# Patient Record
Sex: Male | Born: 1955 | Race: White | Hispanic: No | Marital: Single | Smoking: Never smoker
Health system: Southern US, Community
[De-identification: ages and names within clinical notes are randomized; demographics above are authoritative.]

## PROBLEM LIST (undated history)

## (undated) ENCOUNTER — Emergency Department: Discharge status: Absent without leave | Payer: Medicare Other

## (undated) DIAGNOSIS — F419 Anxiety disorder, unspecified: Secondary | ICD-10-CM

## (undated) DIAGNOSIS — E119 Type 2 diabetes mellitus without complications: Secondary | ICD-10-CM

## (undated) DIAGNOSIS — M21612 Bunion of left foot: Secondary | ICD-10-CM

## (undated) DIAGNOSIS — B20 Human immunodeficiency virus [HIV] disease: Secondary | ICD-10-CM

## (undated) DIAGNOSIS — I10 Essential (primary) hypertension: Secondary | ICD-10-CM

## (undated) DIAGNOSIS — N179 Acute kidney failure, unspecified: Secondary | ICD-10-CM

## (undated) DIAGNOSIS — E781 Pure hyperglyceridemia: Secondary | ICD-10-CM

## (undated) DIAGNOSIS — M21611 Bunion of right foot: Secondary | ICD-10-CM

## (undated) DIAGNOSIS — I2510 Atherosclerotic heart disease of native coronary artery without angina pectoris: Secondary | ICD-10-CM

## (undated) DIAGNOSIS — A5271 Late syphilitic oculopathy: Secondary | ICD-10-CM

## (undated) DIAGNOSIS — E785 Hyperlipidemia, unspecified: Secondary | ICD-10-CM

## (undated) DIAGNOSIS — B59 Pneumocystosis: Secondary | ICD-10-CM

## (undated) DIAGNOSIS — E039 Hypothyroidism, unspecified: Secondary | ICD-10-CM

## (undated) DIAGNOSIS — M1990 Unspecified osteoarthritis, unspecified site: Secondary | ICD-10-CM

## (undated) HISTORY — DX: Late syphilitic oculopathy: A52.71

## (undated) HISTORY — DX: Atherosclerotic heart disease of native coronary artery without angina pectoris: I25.10

## (undated) HISTORY — DX: Type 2 diabetes mellitus without complications: E11.9

## (undated) HISTORY — DX: Acute kidney failure, unspecified: N17.9

## (undated) HISTORY — DX: Bunion of right foot: M21.612

## (undated) HISTORY — DX: Human immunodeficiency virus (HIV) disease: B20

## (undated) HISTORY — DX: Anxiety disorder, unspecified: F41.9

## (undated) HISTORY — DX: Bunion of right foot: M21.611

## (undated) HISTORY — DX: Hypothyroidism, unspecified: E03.9

## (undated) HISTORY — DX: Hyperlipidemia, unspecified: E78.5

## (undated) HISTORY — DX: Essential (primary) hypertension: I10

## (undated) HISTORY — PX: CONDYLOMA EXCISION/FULGURATION: SHX1389

---

## 2007-09-03 DIAGNOSIS — B59 Pneumocystosis: Secondary | ICD-10-CM

## 2007-09-03 HISTORY — DX: Pneumocystosis: B59

## 2007-09-29 ENCOUNTER — Inpatient Hospital Stay (HOSPITAL_COMMUNITY): Admission: EM | Admit: 2007-09-29 | Discharge: 2007-10-11 | Payer: Self-pay | Admitting: Emergency Medicine

## 2007-09-29 ENCOUNTER — Ambulatory Visit: Payer: Self-pay | Admitting: Pulmonary Disease

## 2007-09-29 HISTORY — PX: BRONCHOSCOPY: SUR163

## 2007-09-30 ENCOUNTER — Ambulatory Visit: Payer: Self-pay | Admitting: Infectious Disease

## 2007-10-01 ENCOUNTER — Encounter: Payer: Self-pay | Admitting: Pulmonary Disease

## 2007-10-14 ENCOUNTER — Ambulatory Visit: Payer: Self-pay | Admitting: Infectious Disease

## 2007-10-14 DIAGNOSIS — F1420 Cocaine dependence, uncomplicated: Secondary | ICD-10-CM | POA: Insufficient documentation

## 2007-10-14 DIAGNOSIS — A4901 Methicillin susceptible Staphylococcus aureus infection, unspecified site: Secondary | ICD-10-CM | POA: Insufficient documentation

## 2007-10-14 DIAGNOSIS — Z9189 Other specified personal risk factors, not elsewhere classified: Secondary | ICD-10-CM | POA: Insufficient documentation

## 2007-10-14 DIAGNOSIS — B59 Pneumocystosis: Secondary | ICD-10-CM

## 2007-10-14 DIAGNOSIS — L270 Generalized skin eruption due to drugs and medicaments taken internally: Secondary | ICD-10-CM | POA: Insufficient documentation

## 2007-10-14 DIAGNOSIS — B20 Human immunodeficiency virus [HIV] disease: Secondary | ICD-10-CM | POA: Insufficient documentation

## 2007-10-14 DIAGNOSIS — B370 Candidal stomatitis: Secondary | ICD-10-CM

## 2007-10-15 ENCOUNTER — Encounter (INDEPENDENT_AMBULATORY_CARE_PROVIDER_SITE_OTHER): Payer: Self-pay | Admitting: *Deleted

## 2007-10-21 ENCOUNTER — Ambulatory Visit: Payer: Self-pay | Admitting: Infectious Disease

## 2007-10-21 ENCOUNTER — Encounter (INDEPENDENT_AMBULATORY_CARE_PROVIDER_SITE_OTHER): Payer: Self-pay | Admitting: *Deleted

## 2007-10-27 ENCOUNTER — Emergency Department (HOSPITAL_COMMUNITY): Admission: EM | Admit: 2007-10-27 | Discharge: 2007-10-27 | Payer: Self-pay | Admitting: Emergency Medicine

## 2007-10-27 ENCOUNTER — Telehealth (INDEPENDENT_AMBULATORY_CARE_PROVIDER_SITE_OTHER): Payer: Self-pay | Admitting: *Deleted

## 2007-10-29 ENCOUNTER — Ambulatory Visit: Payer: Self-pay | Admitting: Infectious Disease

## 2007-10-29 LAB — CONVERTED CEMR LAB
BUN: 13 mg/dL (ref 6–23)
Calcium: 7.9 mg/dL — ABNORMAL LOW (ref 8.4–10.5)
Cholesterol: 198 mg/dL (ref 0–200)
Creatinine, Ser: 0.59 mg/dL (ref 0.40–1.50)
Glucose, Bld: 97 mg/dL (ref 70–99)

## 2007-11-03 ENCOUNTER — Encounter (INDEPENDENT_AMBULATORY_CARE_PROVIDER_SITE_OTHER): Payer: Self-pay | Admitting: *Deleted

## 2007-11-04 ENCOUNTER — Ambulatory Visit: Payer: Self-pay | Admitting: Infectious Disease

## 2007-11-04 LAB — CONVERTED CEMR LAB
ALT: 23 units/L (ref 0–53)
AST: 19 units/L (ref 0–37)
Alkaline Phosphatase: 81 units/L (ref 39–117)
Bilirubin Urine: NEGATIVE
CO2: 25 meq/L (ref 19–32)
Calcium: 8.9 mg/dL (ref 8.4–10.5)
Chloride: 101 meq/L (ref 96–112)
GC Probe Amp, Urine: NEGATIVE
Indirect Bilirubin: 0.2 mg/dL (ref 0.0–0.9)
Potassium: 4.1 meq/L (ref 3.5–5.3)
Protein, ur: NEGATIVE mg/dL
Sodium: 137 meq/L (ref 135–145)
Total Protein: 7 g/dL (ref 6.0–8.3)
Urine Glucose: NEGATIVE mg/dL

## 2007-12-03 ENCOUNTER — Ambulatory Visit: Payer: Self-pay | Admitting: Infectious Disease

## 2007-12-29 ENCOUNTER — Ambulatory Visit: Payer: Self-pay | Admitting: Infectious Disease

## 2008-02-23 ENCOUNTER — Ambulatory Visit: Payer: Self-pay | Admitting: Infectious Disease

## 2008-04-12 ENCOUNTER — Ambulatory Visit: Payer: Self-pay | Admitting: Infectious Disease

## 2008-04-12 ENCOUNTER — Encounter (INDEPENDENT_AMBULATORY_CARE_PROVIDER_SITE_OTHER): Payer: Self-pay | Admitting: *Deleted

## 2008-04-12 DIAGNOSIS — R5381 Other malaise: Secondary | ICD-10-CM | POA: Insufficient documentation

## 2008-04-12 DIAGNOSIS — E1159 Type 2 diabetes mellitus with other circulatory complications: Secondary | ICD-10-CM | POA: Insufficient documentation

## 2008-04-12 DIAGNOSIS — R5383 Other fatigue: Secondary | ICD-10-CM

## 2008-04-12 DIAGNOSIS — I10 Essential (primary) hypertension: Secondary | ICD-10-CM

## 2008-04-16 ENCOUNTER — Encounter: Payer: Self-pay | Admitting: *Deleted

## 2008-04-19 ENCOUNTER — Ambulatory Visit: Payer: Self-pay | Admitting: Infectious Disease

## 2008-04-24 DIAGNOSIS — E039 Hypothyroidism, unspecified: Secondary | ICD-10-CM | POA: Insufficient documentation

## 2008-04-24 LAB — CONVERTED CEMR LAB
CO2: 23 meq/L (ref 19–32)
Chloride: 103 meq/L (ref 96–112)
Glucose, Bld: 91 mg/dL (ref 70–99)
Potassium: 4 meq/L (ref 3.5–5.3)
Sodium: 140 meq/L (ref 135–145)
Testosterone: 352.76 ng/dL (ref 350–890)

## 2008-06-03 ENCOUNTER — Ambulatory Visit: Payer: Self-pay | Admitting: Infectious Disease

## 2008-06-03 LAB — CONVERTED CEMR LAB: TSH: 3.074 microintl units/mL (ref 0.350–4.500)

## 2008-07-13 ENCOUNTER — Ambulatory Visit: Payer: Self-pay | Admitting: Infectious Disease

## 2008-08-06 ENCOUNTER — Encounter: Payer: Self-pay | Admitting: *Deleted

## 2008-09-13 ENCOUNTER — Ambulatory Visit: Payer: Self-pay | Admitting: Infectious Disease

## 2008-09-13 LAB — CONVERTED CEMR LAB
Cholesterol, target level: 200 mg/dL
HDL goal, serum: 40 mg/dL
LDL Goal: 130 mg/dL

## 2008-09-16 ENCOUNTER — Encounter (INDEPENDENT_AMBULATORY_CARE_PROVIDER_SITE_OTHER): Payer: Self-pay | Admitting: Licensed Clinical Social Worker

## 2008-10-04 ENCOUNTER — Ambulatory Visit: Payer: Self-pay | Admitting: Infectious Disease

## 2008-10-04 ENCOUNTER — Encounter: Payer: Self-pay | Admitting: Infectious Disease

## 2008-10-04 LAB — CONVERTED CEMR LAB
AST: 20 units/L (ref 0–37)
Alkaline Phosphatase: 74 units/L (ref 39–117)
Bilirubin, Direct: 0.1 mg/dL (ref 0.0–0.3)
CD4 Count: 412 microliters
CO2: 26 meq/L (ref 19–32)
Calcium: 9.4 mg/dL (ref 8.4–10.5)
Creatinine, Ser: 0.97 mg/dL (ref 0.40–1.50)
Glucose, Bld: 126 mg/dL — ABNORMAL HIGH (ref 70–99)
HIV 1 RNA Quant: 41 copies/mL
LDL Cholesterol: 64 mg/dL (ref 0–99)
Phosphorus: 3.2 mg/dL (ref 2.3–4.6)
Total Bilirubin: 0.3 mg/dL (ref 0.3–1.2)
Total CHOL/HDL Ratio: 4.1
Total Protein, Urine: 5

## 2009-01-24 ENCOUNTER — Encounter (INDEPENDENT_AMBULATORY_CARE_PROVIDER_SITE_OTHER): Payer: Self-pay | Admitting: *Deleted

## 2009-01-24 ENCOUNTER — Ambulatory Visit: Payer: Self-pay | Admitting: Infectious Disease

## 2009-01-24 LAB — CONVERTED CEMR LAB
ALT: 16 units/L (ref 0–53)
AST: 16 units/L (ref 0–37)
Alkaline Phosphatase: 77 units/L (ref 39–117)
BUN: 13 mg/dL (ref 6–23)
Bilirubin, Direct: 0.1 mg/dL (ref 0.0–0.3)
CD4 Count: 465 microliters
Calcium: 9.8 mg/dL (ref 8.4–10.5)
Cholesterol: 155 mg/dL (ref 0–200)
HIV 1 RNA Quant: 39 copies/mL
Indirect Bilirubin: 0.3 mg/dL (ref 0.0–0.9)
Potassium: 4.7 meq/L (ref 3.5–5.3)
Total Protein: 7.9 g/dL (ref 6.0–8.3)
Triglycerides: 177 mg/dL — ABNORMAL HIGH (ref <150)
VLDL: 35 mg/dL (ref 0–40)

## 2009-02-07 ENCOUNTER — Encounter: Payer: Self-pay | Admitting: Licensed Clinical Social Worker

## 2009-02-07 ENCOUNTER — Ambulatory Visit: Payer: Self-pay | Admitting: Infectious Disease

## 2009-02-07 ENCOUNTER — Telehealth (INDEPENDENT_AMBULATORY_CARE_PROVIDER_SITE_OTHER): Payer: Self-pay | Admitting: *Deleted

## 2009-02-07 DIAGNOSIS — E119 Type 2 diabetes mellitus without complications: Secondary | ICD-10-CM | POA: Insufficient documentation

## 2009-02-07 LAB — CONVERTED CEMR LAB
Chlamydia, Swab/Urine, PCR: NEGATIVE
GC Probe Amp, Urine: NEGATIVE
HDL goal, serum: 40 mg/dL
Hgb A1c MFr Bld: 6.3 %

## 2009-02-08 ENCOUNTER — Encounter (INDEPENDENT_AMBULATORY_CARE_PROVIDER_SITE_OTHER): Payer: Self-pay | Admitting: *Deleted

## 2009-03-21 ENCOUNTER — Ambulatory Visit: Payer: Self-pay | Admitting: Internal Medicine

## 2009-03-21 LAB — CONVERTED CEMR LAB: Blood Glucose, Fingerstick: 186

## 2009-04-08 ENCOUNTER — Encounter (INDEPENDENT_AMBULATORY_CARE_PROVIDER_SITE_OTHER): Payer: Self-pay | Admitting: *Deleted

## 2009-04-11 ENCOUNTER — Encounter: Payer: Self-pay | Admitting: Infectious Disease

## 2009-04-18 ENCOUNTER — Encounter (INDEPENDENT_AMBULATORY_CARE_PROVIDER_SITE_OTHER): Payer: Self-pay | Admitting: Licensed Clinical Social Worker

## 2009-04-18 ENCOUNTER — Ambulatory Visit: Payer: Self-pay | Admitting: Internal Medicine

## 2009-05-10 ENCOUNTER — Ambulatory Visit: Payer: Self-pay | Admitting: Infectious Disease

## 2009-05-10 LAB — CONVERTED CEMR LAB
ALT: 12 units/L (ref 0–53)
Alkaline Phosphatase: 80 units/L (ref 39–117)
BUN: 18 mg/dL (ref 6–23)
Bilirubin, Direct: 0.1 mg/dL (ref 0.0–0.3)
CD4 Count: 375 microliters
Calcium: 9.8 mg/dL (ref 8.4–10.5)
Cholesterol: 111 mg/dL (ref 0–200)
Creatinine, Ser: 0.9 mg/dL (ref 0.40–1.50)
Glucose, Bld: 99 mg/dL (ref 70–99)
HIV 1 RNA Quant: 39 copies/mL
Indirect Bilirubin: 0.2 mg/dL (ref 0.0–0.9)
Total Protein: 7.9 g/dL (ref 6.0–8.3)
Triglycerides: 89 mg/dL (ref <150)
VLDL: 18 mg/dL (ref 0–40)

## 2009-05-23 IMAGING — CR DG CHEST 1V PORT
1 series · 1 of 1 positions shown · non-contrast
Comparison: 10/02/2007

CLINICAL DATA: Pneumonia

PORTABLE CHEST - 1 VIEW

[view not recorded]
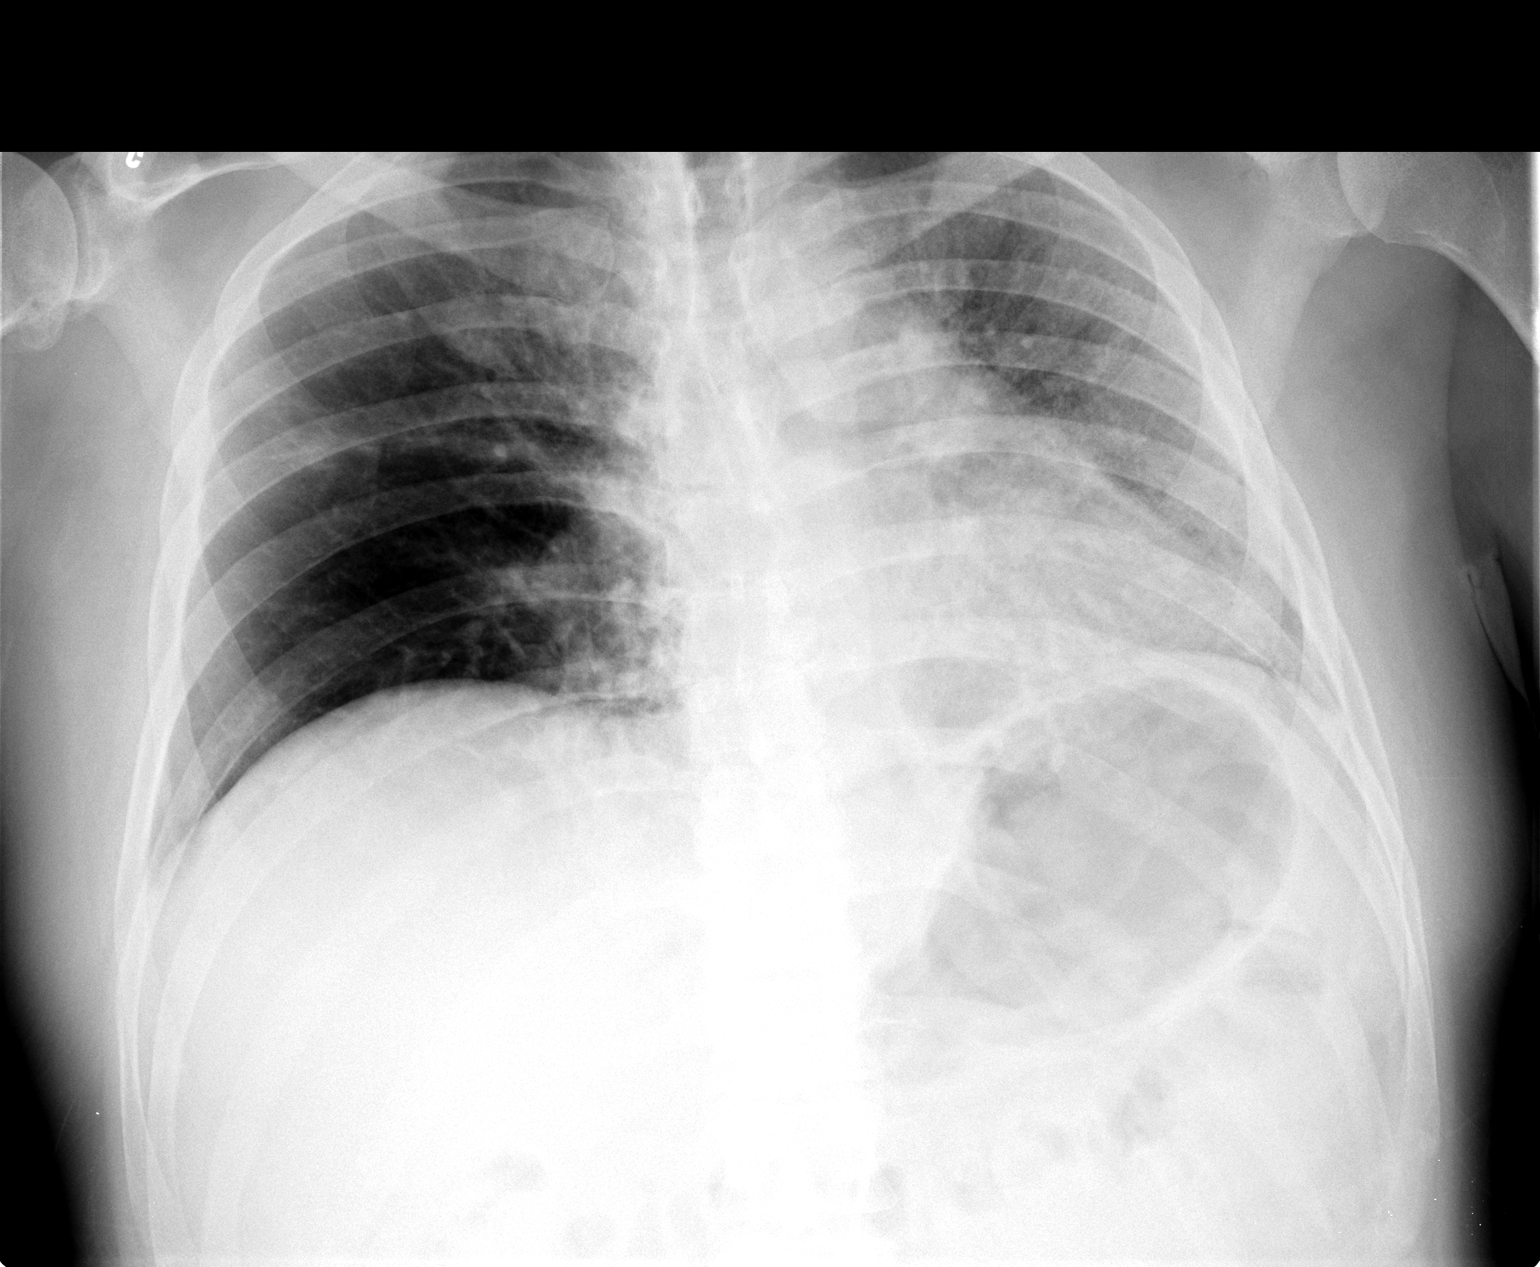

[1 of 1 positions shown; findings below may reference images not displayed]

FINDINGS: Persistent asymmetric airspace process, greater on the
left.  No pneumothorax or pleural effusions.
IMPRESSION: 1.  Persistent asymmetric airspace process.

## 2009-05-24 ENCOUNTER — Telehealth: Payer: Self-pay | Admitting: Infectious Disease

## 2009-06-13 ENCOUNTER — Ambulatory Visit: Payer: Self-pay | Admitting: Infectious Disease

## 2009-06-13 DIAGNOSIS — IMO0002 Reserved for concepts with insufficient information to code with codable children: Secondary | ICD-10-CM | POA: Insufficient documentation

## 2009-06-27 ENCOUNTER — Encounter: Payer: Self-pay | Admitting: Infectious Disease

## 2009-06-27 DIAGNOSIS — F341 Dysthymic disorder: Secondary | ICD-10-CM | POA: Insufficient documentation

## 2009-08-16 ENCOUNTER — Ambulatory Visit: Payer: Self-pay | Admitting: Internal Medicine

## 2009-08-16 LAB — HM DIABETES FOOT EXAM

## 2009-08-31 ENCOUNTER — Ambulatory Visit: Payer: Self-pay | Admitting: Infectious Disease

## 2009-08-31 ENCOUNTER — Encounter: Payer: Self-pay | Admitting: Infectious Disease

## 2009-08-31 LAB — CONVERTED CEMR LAB
ALT: 11 units/L (ref 0–53)
AST: 16 units/L (ref 0–37)
Albumin: 5.1 g/dL (ref 3.5–5.2)
Bilirubin, Direct: 0.1 mg/dL (ref 0.0–0.3)
CD4 Count: 518 microliters
Calcium: 9.9 mg/dL (ref 8.4–10.5)
Cholesterol: 121 mg/dL (ref 0–200)
Creatinine, Urine: 71.2 mg/dL
HCV Ab: NEGATIVE
HDL: 36 mg/dL — ABNORMAL LOW (ref >39)
Hemoglobin, Urine: NEGATIVE
Ketones, ur: NEGATIVE mg/dL
Leukocytes, UA: NEGATIVE
Nitrite: NEGATIVE
Phosphorus: 3 mg/dL (ref 2.3–4.6)
Potassium: 4.5 meq/L (ref 3.5–5.3)
Sodium: 139 meq/L (ref 135–145)
Specific Gravity, Urine: 1.014 (ref 1.005–1.030)
Total CHOL/HDL Ratio: 3.4
Total Protein, Urine: 3
Total Protein: 8.1 g/dL (ref 6.0–8.3)
Triglycerides: 123 mg/dL (ref <150)
Urobilinogen, UA: 0.2 (ref 0.0–1.0)
pH: 6 (ref 5.0–8.0)

## 2009-11-30 ENCOUNTER — Telehealth: Payer: Self-pay | Admitting: Infectious Disease

## 2009-12-26 ENCOUNTER — Ambulatory Visit: Payer: Self-pay | Admitting: Infectious Disease

## 2009-12-26 ENCOUNTER — Encounter: Payer: Self-pay | Admitting: Infectious Disease

## 2009-12-26 DIAGNOSIS — H547 Unspecified visual loss: Secondary | ICD-10-CM

## 2009-12-26 DIAGNOSIS — G454 Transient global amnesia: Secondary | ICD-10-CM

## 2009-12-26 LAB — CONVERTED CEMR LAB
ALT: <8 units/L (ref 0–53)
AST: 13 units/L (ref 0–37)
Albumin: 4.5 g/dL (ref 3.5–5.2)
CO2: 28 meq/L (ref 19–32)
Calcium: 9.6 mg/dL (ref 8.4–10.5)
HDL: 43 mg/dL (ref >39)
HIV 1 RNA Quant: 39 copies/mL
Phosphorus: 2.7 mg/dL (ref 2.3–4.6)
Potassium: 4.8 meq/L (ref 3.5–5.3)
Sodium: 140 meq/L (ref 135–145)
Total Bilirubin: 0.3 mg/dL (ref 0.3–1.2)
Total CHOL/HDL Ratio: 3
VLDL: 25 mg/dL (ref 0–40)

## 2009-12-30 ENCOUNTER — Telehealth: Payer: Self-pay | Admitting: Infectious Disease

## 2010-01-02 ENCOUNTER — Telehealth (INDEPENDENT_AMBULATORY_CARE_PROVIDER_SITE_OTHER): Payer: Self-pay | Admitting: Licensed Clinical Social Worker

## 2010-02-15 ENCOUNTER — Telehealth: Payer: Self-pay | Admitting: Infectious Disease

## 2010-02-21 ENCOUNTER — Ambulatory Visit: Payer: Self-pay | Admitting: Infectious Disease

## 2010-02-21 DIAGNOSIS — J984 Other disorders of lung: Secondary | ICD-10-CM

## 2010-02-21 DIAGNOSIS — S0230XA Fracture of orbital floor, unspecified side, initial encounter for closed fracture: Secondary | ICD-10-CM | POA: Insufficient documentation

## 2010-02-21 DIAGNOSIS — R911 Solitary pulmonary nodule: Secondary | ICD-10-CM | POA: Insufficient documentation

## 2010-02-28 ENCOUNTER — Encounter (INDEPENDENT_AMBULATORY_CARE_PROVIDER_SITE_OTHER): Payer: Self-pay | Admitting: *Deleted

## 2010-04-02 LAB — CONVERTED CEMR LAB
ALT: 10 units/L (ref 0–53)
ALT: 13 units/L (ref 0–53)
ALT: 20 units/L (ref 0–53)
AST: 13 units/L (ref 0–37)
AST: 17 units/L (ref 0–37)
Albumin: 4.4 g/dL (ref 3.5–5.2)
Albumin: 4.7 g/dL (ref 3.5–5.2)
Alkaline Phosphatase: 62 units/L (ref 39–117)
BUN: 14 mg/dL (ref 6–23)
BUN: 14 mg/dL (ref 6–23)
BUN: 18 mg/dL (ref 6–23)
Basophils Relative: 1 % (ref 0–1)
Bilirubin, Direct: 0.1 mg/dL (ref 0.0–0.3)
Bilirubin, Direct: 0.1 mg/dL (ref 0.0–0.3)
CD4 Count: 323 microliters
CD4 Count: 354 microliters
CD4 Count: 39 microliters
CO2: 20 meq/L (ref 19–32)
CO2: 22 meq/L (ref 19–32)
CO2: 25 meq/L (ref 19–32)
CO2: 25 meq/L (ref 19–32)
CO2: 27 meq/L (ref 19–32)
Calcium: 8.7 mg/dL (ref 8.4–10.5)
Calcium: 9.4 mg/dL (ref 8.4–10.5)
Calcium: 9.9 mg/dL (ref 8.4–10.5)
Chloride: 102 meq/L (ref 96–112)
Chloride: 105 meq/L (ref 96–112)
Chloride: 105 meq/L (ref 96–112)
Chloride: 95 meq/L — ABNORMAL LOW (ref 96–112)
Cholesterol: 166 mg/dL (ref 0–200)
Creatinine, Ser: 0.77 mg/dL (ref 0.40–1.50)
Creatinine, Ser: 0.8 mg/dL (ref 0.40–1.50)
Creatinine, Ser: 0.89 mg/dL (ref 0.40–1.50)
Creatinine, Ser: 0.92 mg/dL (ref 0.40–1.50)
Eosinophils Absolute: 0.4 10*3/uL (ref 0.0–0.7)
HCT: 42.5 % (ref 39.0–52.0)
HDL: 35 mg/dL — ABNORMAL LOW (ref >39)
HDL: 39 mg/dL — ABNORMAL LOW (ref >39)
HIV 1 RNA Quant: 39 copies/mL
Hemoglobin: 13.8 g/dL (ref 13.0–17.0)
Hep B Core Total Ab: NEGATIVE
Hep B S Ab: NEGATIVE
Hepatitis B Surface Ag: NEGATIVE
Indirect Bilirubin: 0.3 mg/dL (ref 0.0–0.9)
Indirect Bilirubin: 0.3 mg/dL (ref 0.0–0.9)
LDL Cholesterol: 109 mg/dL — ABNORMAL HIGH (ref 0–99)
LDL Cholesterol: 54 mg/dL (ref 0–99)
LDL Cholesterol: 99 mg/dL (ref 0–99)
MCHC: 32.5 g/dL (ref 30.0–36.0)
MCV: 91 fL (ref 78.0–100.0)
Monocytes Absolute: 0.5 10*3/uL (ref 0.1–1.0)
Monocytes Relative: 7 % (ref 3–12)
Nitrite: NEGATIVE
Phosphorus: 3.5 mg/dL (ref 2.3–4.6)
Phosphorus: 4.4 mg/dL (ref 2.3–4.6)
Potassium: 4.2 meq/L (ref 3.5–5.3)
Potassium: 5 meq/L (ref 3.5–5.3)
RBC: 4.67 M/uL (ref 4.22–5.81)
Sodium: 138 meq/L (ref 135–145)
Sodium: 141 meq/L (ref 135–145)
Specific Gravity, Urine: >=1.03
Total Bilirubin: 0.3 mg/dL (ref 0.3–1.2)
Total Bilirubin: 0.4 mg/dL (ref 0.3–1.2)
Total Bilirubin: 0.4 mg/dL (ref 0.3–1.2)
Total CHOL/HDL Ratio: 4.9
Total Protein: 7 g/dL (ref 6.0–8.3)
Total Protein: 7.7 g/dL (ref 6.0–8.3)
Total Protein: 7.8 g/dL (ref 6.0–8.3)
Total Protein: 8.1 g/dL (ref 6.0–8.3)
Triglycerides: 164 mg/dL — ABNORMAL HIGH (ref <150)
VLDL: 33 mg/dL (ref 0–40)
VLDL: 79 mg/dL — ABNORMAL HIGH (ref 0–40)
WBC Urine, dipstick: NEGATIVE
pH: 5.5

## 2010-04-03 ENCOUNTER — Encounter: Payer: Self-pay | Admitting: Infectious Disease

## 2010-04-03 ENCOUNTER — Telehealth: Payer: Self-pay | Admitting: Infectious Disease
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
## 2010-04-04 NOTE — Miscellaneous (Signed)
Summary: HIV-1 RNA, CD4 (RESEARCH)  Clinical Lists Changes  Observations: Added new observation of CD4 COUNT: 361 microliters (12/26/2009 11:06) Added new observation of HIV1RNA QA: 39 copies/mL (12/26/2009 11:06)

## 2010-04-04 NOTE — Progress Notes (Signed)
Summary: CID Office Visit  CID Office Visit   Imported By: Florinda Marker 06/14/2009 14:29:32  _____________________________________________________________________  External Attachment:    Type:   Image     Comment:   External Document

## 2010-04-04 NOTE — Miscellaneous (Signed)
Summary: HIV-1 RNA, CD4 (RESEARCH)  Clinical Lists Changes  Observations: Added new observation of CD4 COUNT: 518 microliters (08/31/2009 16:36) Added new observation of HIV1RNA QA: 39 copies/mL (08/31/2009 16:36)

## 2010-04-04 NOTE — Progress Notes (Signed)
Summary: Please call   Phone Note Other Incoming   Caller: [phone] Summary of Call: Pt  called .  Requesting return call from [facility] RN  January 02, 2010 10:00 AM   Follow-up for Phone Call        I returned patient's call and we received the notes from [facility]. Waiting on Dr. Daiva Eves to review them and fill out the patients DMV form provided that all the information is available in the notes Follow-up by: Starleen Arms CMA,  January 03, 2010 4:40 PM

## 2010-04-04 NOTE — Letter (Signed)
Summary: Jonathan Estes. Tax: Cert. Of Disability  Jonathan Estes. Tax: Cert. Of Disability   Imported By: Florinda Marker 06/29/2009 09:15:55  _____________________________________________________________________  External Attachment:    Type:   Image     Comment:   External Document

## 2010-04-04 NOTE — Assessment & Plan Note (Signed)
Summary: DM TRAINING/VS   Vital Signs:  Patient profile:   55 year old male Weight:      215.8 pounds BMI:     31.52  Allergies: 1)  ! Penicillin 2)  ! Bactrim Ds (Sulfamethoxazole-Trimethoprim)   Complete Medication List: 1)  Truvada 200-300 Mg Tabs (Emtricitabine-tenofovir) .Marland Kitchen.. 1 by mouth daily 2)  Isentress 400 Mg Tabs (Raltegravir potassium) .Marland Kitchen.. 1 by mouth bid 3)  Synthroid 25 Mcg Tabs (Levothyroxine sodium) .... Take one tablet on on empty stomach daily 4)  Lopid 600 Mg Tabs (Gemfibrozil) .... Take 1 tablet by mouth two times a day 5)  Ativan 1 Mg Tabs (Lorazepam) .... Take one half to to one whole tablet at night as needed for anxiety 6)  Lisinopril 40 Mg Tabs (Lisinopril) .... Take 1 tablet by mouth once a day (you will need your bloodwork checked one week after going to the higher dose) 7)  Hydrochlorothiazide 12.5 Mg Caps (Hydrochlorothiazide) .... Take 1 tablet by mouth once a day 8)  Metformin Hcl 500 Mg Tabs (Metformin hcl) .... Take 1 tablet by mouth two times a day for one week, then two tablets in the am and one in pm then two tablets twice daily  Other Orders: DSMT(Medicare) Individual, 30 Minutes (Z6109)  Diabetes Self Management Training  PCP: Paulette Blanch Dam MD Date diagnosed with diabetes: 03/07/2009 Diabetes Type: Type 2 non-insulin Other persons present: no Current smoking Status: never  Vital Signs Todays Weight: 215.8lb  in BMI 31.52in-lbs   Assessment Work Hours: Not currently working Daily activities: chops wood when not too cold, willing to walk, but bunions on feet hurt Sources of Support: discussed today- not oign to social functions to avoid foods being served Affect: Appropriate  Coping with Diabetes Feelings about Diabetes: Acceptance  Diabetes Medications:  Comments: brought meter fand food records- has been checking before nad after one meal a week lately to cut back on strips. Highest CBG has been 121 post largest meal he's  eaten. Kept excellent records of foods, crbs calories,     Monitoring Self monitoring blood glucose 1 -2 times a week Name of Meter  [employer]  Recent Episodes of: Requiring Help from another person  Hyperglycemia : No Hypoglycemia: No Severe Hypoglycemia : No   Car[employer] Food for Low Blood sugar No   Estimated /Usual Carb Intake Breakfast # of Carbs/Grams 20-100 grams for breakfast per food records Midmorning # of Carbs/Grams 10-20 grams for snacks Lunch # of Carbs/Grams 20-96 grams for lumch per food records Dinner # of Carbs/Grams 50-110 grams for dinner per food records  Activity Limitations  Appropriate physical activity  Barriers  Physical limitations Diabetes Disease Process  Discussed today Define diabetes in simple terms: Needs review/assistance   State own type of diabetes: Demonstrates competency    Medications State name-action-dose-duration-side effects-and time to take medication: Demonstrates competency   Describe safe needle/lancet disposal: Demonstrates competency Nutritional Management Identify what foods most often affect blood glucose: Demonstrates competencyVerbalize importance of controlling food portions: Demonstrates competencyState importance of spacing and not omitting meals and snacks: Educational psychologist purpose and frequency of monitoring BG-ketones-HgbA1C  : Designer, multimedia target blood glucose and HgbA1C goals: Needs review/assistance    Complications State the causes-signs and symptoms and prevention of Hyperglycemia: Demonstrates competency   Explain proper treatment of hyperglycemia: Demonstrates competency   State the causes- signs and symptoms and prevention of hypoglycemia: Not applicableState when to seek medical advice and treatment: Demonstrates  competency Exercise States importance of exercise: Demonstrates competencyStates effect of exercise on blood glucose: Demonstrates  competencyVerbalizes safety measures for exercise related to diabetes: Demonstrates competency Lifestyle changes:Goal setting and Problem solving State benefits of making appropriate lifestyle changes: Demonstrates competencyIdentify lifestyle behaviors that need to change: Needs review/assistance   Verbalize need for and frequency of health care follow-up: Needs review/assistance   List at least two appropriate community resources: Needs review/assistance   Identify Family/SO role in managing diabetes: Needs review/assistance    Psychosocial Adjustment State three common feelings that might be experienced when learning to cope with diabetes: Needs review/assistance   Identify two methods to cope with these feelings: Needs review/assistance   Diabetes Management Education Done: 04/18/2009    BEHAVIORAL GOALS INITIAL Preventing,detecting and treating complications: working  on support for his chronic conditions    BEHAVIORAL GOAL FOLLOW UP  Goal attained Specific goal set today: Did a wonderful job keeping foods records- says he realizes he is memorizing much of what his is writing down so now he can do it in his head      Foot exam, eye exam and microalb die- will diascuss with his doctor. has lost 10 pounds intentionally. says money is tight, but this is helping him rather than hindering him. Informed him of patient assistance for test strips. Diabetes Self Management Support: still feels that he needs ot do this alone.  Discussed support plans again today. patient currently plans to keep visits with CDE. He is not interested n support groups either for HIV or diabetes. Follow-up:3-4 months    Appended Document: Diabetes referrals    Clinical Lists Changes  Orders: Added new Referral order of Ophthalmology Referral (Ophthalmology) - Signed Added new Referral order of Podiatry Referral (Podiatry) - Signed Added new Test order of T-Microalbumin Urine [facility]) ([id number]) -  Signed

## 2010-04-04 NOTE — Progress Notes (Signed)
Summary: requesting rx quantity change/TY  Phone Note Call from Patient Call back at Home Phone [phone]   Caller: Patient Summary of Call: Patient called stating that he can get his rx's cheaper if changed to 90 day supply and the quantity needs to be changed to 60 on his two times a day meds. The medications that he needs changed would be Synthroid, Lopid, Lisinipril and HCTZ. His pharmacy is [facility]. Initial call taken by: Starleen Arms CMA,  May 24, 2009 11:23 AM    New/Updated Medications: LISINOPRIL 40 MG TABS (LISINOPRIL) Take 1 tablet by mouth once a day (you will need your bloodwork checked one week after going to the higher dose) Prescriptions: LISINOPRIL 40 MG TABS (LISINOPRIL) Take 1 tablet by mouth once a day (you will need your bloodwork checked one week after going to the higher dose)  #90 x 4   Entered and Authorized by:   Acey Lav MD   Signed by:   Acey Lav MD on 05/24/2009   Method used:   Electronically to        [facility] [phone]* (retail)       [address] [place], [place]  [address]       Ph: [phone]       Fax: [phone]   RxID:   [id number] HYDROCHLOROTHIAZIDE 12.5 MG CAPS (HYDROCHLOROTHIAZIDE) Take 1 tablet by mouth once a day  #90 x 4   Entered and Authorized by:   Acey Lav MD   Signed by:   Acey Lav MD on 05/24/2009   Method used:   Electronically to        [facility] [phone]* (retail)       [address] [place], [place]  [address]       Ph: [phone]       Fax: [phone]   RxID:   [id number] LOPID 600 MG TABS (GEMFIBROZIL) Take 1 tablet by mouth two times a day  #120 x 4   Entered and Authorized by:   Acey Lav MD   Signed by:   Acey Lav MD on 05/24/2009   Method used:   Electronically to        [facility] [phone]* (retail)       [address] [place], [place]  [address]       Ph: [phone]       Fax:  [phone]   RxID:   [id number] SYNTHROID 25 MCG TABS (LEVOTHYROXINE SODIUM) take one tablet on on empty stomach daily  #90 x 4   Entered and Authorized by:   Acey Lav MD   Signed by:   Acey Lav MD on 05/24/2009   Method used:   Electronically to        [facility] [phone]* (retail)       [address] [place], [place]  [address]       Ph: [phone]       Fax: [phone]   RxID:   [id number]

## 2010-04-04 NOTE — Progress Notes (Signed)
Summary: Refill request for Lorazepam  Phone Note From Pharmacy   Caller: [facility] Reason for Call: Needs renewal Summary of Call: received a refill request for patient's Lorazepam 1mg  Take 1 tablet by mouth every night at bedtime #30 Last filled 11/04/09.  Patient's last visit was 06/2009 with Daiva Eves.  No upcoming appointments scheduled.  Do you want to refill medication and if so, how many refills would you like to give.  They can have up to 5 refills on this medication. Initial call taken by: Paulo Fruit  BS,CPht II,MPH,  November 30, 2009 11:03 AM  Follow-up for Phone Call        it is fine to give him script. He should also make a fu appt with me too though Follow-up by: Acey Lav MD,  November 30, 2009 12:51 PM     Appended Document: Refill request for Lorazepam [facility] pharmacy notified.

## 2010-04-04 NOTE — Miscellaneous (Signed)
Summary: HIV-1 RNA,CD4 (RESEARCH)  Clinical Lists Changes  Observations: Added new observation of CD4 COUNT: 361 microliters (12/26/2009 11:25) Added new observation of HIV1RNA QA: 39 copies/mL (12/26/2009 11:25)

## 2010-04-04 NOTE — Assessment & Plan Note (Signed)
Summary: 4 MONTH F/U/VS   Visit Type:  Follow-up Primary Provider:  Daiva Eves   History of Present Illness: 55 year old Caucasian with HIV  enrolled in ACTG 5257 and randomized to raltegravir and truvada. He is also being treated for hypothyroidism, HTN, DM. He presents today for followup.  He continues to feel fatigued with strenuous activity. He has had some troubles with a muscle strain in his neck. otherwise he has no other complaints today.    Lipid Management History:      Positive NCEP/ATP III risk factors include male age 37 years old or older, diabetes, HDL cholesterol less than 40, and hypertension.  Negative NCEP/ATP III risk factors include no family history for ischemic heart disease, non-tobacco-user status, no ASHD (atherosclerotic heart disease), no prior stroke/TIA, no peripheral vascular disease, and no history of aortic aneurysm.    Current Medications (verified): 1)  Truvada 200-300 Mg Tabs (Emtricitabine-Tenofovir) .Marland Kitchen.. 1 By Mouth Daily 2)  Isentress 400 Mg Tabs (Raltegravir Potassium) .Marland Kitchen.. 1 By Mouth Bid 3)  Synthroid 25 Mcg Tabs (Levothyroxine Sodium) .... Take One Tablet On On Empty Stomach Daily 4)  Lopid 600 Mg Tabs (Gemfibrozil) .... Take 1 Tablet By Mouth Two Times A Day 5)  Ativan 1 Mg Tabs (Lorazepam) .... Take One Half To To One Whole Tablet At Night As Needed For Anxiety 6)  Lisinopril 40 Mg Tabs (Lisinopril) .... Take 1 Tablet By Mouth Once A Day (You Will Need Your Bloodwork Checked One Week After Going To The Higher Dose) 7)  Hydrochlorothiazide 12.5 Mg Caps (Hydrochlorothiazide) .... Take 1 Tablet By Mouth Once A Day 8)  Metformin Hcl 500 Mg Tabs (Metformin Hcl) .... Take 1 Tablet By Mouth Two Times A Day For One Week, Then Two Tablets in The Am and One in Pm Then Two Tablets Twice Daily  Allergies (verified): 1)  ! Penicillin 2)  ! Bactrim Ds (Sulfamethoxazole-Trimethoprim)    Current Allergies (reviewed today): ! PENICILLIN ! BACTRIM DS  (SULFAMETHOXAZOLE-TRIMETHOPRIM) Past History:  Past Medical History: PAST MEDICAL HISTORY:  HIV PCP [facility] Pneumonia  1. Status post anal wart excision years ago.   2. Marijuana use.   Diabetes Hypothyroidism  Past Surgical History: Reviewed history from 02/07/2009 and no changes required. None  Family History: Reviewed history from 10/14/2007 and no changes required.  His mother is 76 years of age and has no chronic   medical conditions.  His father died of colon cancer.      Social History: Reviewed history from 04/12/2008 and no changes required.  The patient is single.  He did have sex with both men   and women.  He is currently living with his mother in [place];   however, he is from [place].  He is unemployed.  He smokes   marijuana daily when he is well.  He has used cocaine in the past, but   now his cocaine use is rare to infrequent.  He denies alcohol use and   tobacco use.  He has no children. No hx of IVDU.  Review of Systems       The patient complains of dyspnea on exertion.  The patient denies anorexia, fever, weight loss, weight gain, vision loss, decreased hearing, hoarseness, chest pain, syncope, peripheral edema, prolonged cough, headaches, hemoptysis, abdominal pain, melena, hematochezia, severe indigestion/heartburn, hematuria, incontinence, genital sores, muscle weakness, suspicious skin lesions, transient blindness, difficulty walking, depression, unusual weight change, abnormal bleeding, and enlarged lymph nodes.  Physical Exam  General:  alert and well-developed.  overweight-appearing.   Head:  normocephalic and atraumatic.   Eyes:  vision grossly intact. pupils equal and pupils round.   Ears:  no external deformities.   Nose:  no external deformity and no external erythema.   Mouth:  no erythema and no exudates.  pharynx pink and moist.   Neck:  supple and full ROM.   Lungs:  normal respiratory effort, no crackles, and  no wheezes.   Heart:  normal rate, regular rhythm, no murmur, and no gallop.   Abdomen:  soft, non-tender,no distention.   Msk:  normal ROM and no joint deformities.  no shoulder deformity Extremities:  No clubbing, cyanosis, edema, or deformity noted with normal full range of motion of all joints.   Neurologic:  alert & oriented X3.  strength normal in all extremities and sensation intact to light touch.   Skin:  turgor normal and no rashes.   Psych:  Oriented X3, memory intact for recent and remote, normally interactive, and good eye contact.          Medication Adherence: 06/13/2009   Adherence to medications reviewed with patient. Counseling to provide adequate adherence provided   Prevention For Positives: 06/13/2009   Safe sex practices discussed with patient. Condoms offered.   Education Materials Provided: 06/13/2009 Safe sex practices discussed with patient. Condoms offered.                           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 Patient Level IV (04540)  Problem # 2:  DIABETES MELLITUS, TYPE II (ICD-250.00)  Rewrote his script for metformin at 1g two times a day. He has optho and podiatry appts, but is waiting until he gets medicaid His updated medication list for this problem includes:    Lisinopril 40 Mg Tabs (Lisinopril) .Marland Kitchen... Take 1 tablet by mouth once a day (you will need your bloodwork checked one week after going to the higher dose)    Metformin Hcl 500 Mg Tabs (Metformin hcl) .Marland Kitchen... Take 1 tablet by mouth two times a day for one week, then two tablets in the am and one in pm then two tablets twice daily  Orders: Est. Patient Level IV  (98119)  Problem # 3:  HYPOTHYROIDISM, BORDERLINE (ICD-244.9)  continue synthroid His updated medication list for this problem includes:    Synthroid 25 Mcg Tabs (Levothyroxine sodium) .Marland Kitchen... Take one tablet on on empty stomach daily  Orders: Est. Patient Level IV (14782)  Problem # 4:  ESSENTIAL HYPERTENSION, BENIGN (ICD-401.1)  BP has been relatively well controlled His updated medication list for this problem includes:    Lisinopril 40 Mg Tabs (Lisinopril) .Marland Kitchen... Take 1 tablet by mouth once a day (you will need your bloodwork checked one week after going to the higher dose)    Hydrochlorothiazide 12.5 Mg Caps (Hydrochlorothiazide) .Marland Kitchen... Take 1 tablet by mouth once a day  Prior BP: 115/78 (05/10/2009)  Prior 10 Yr Risk Heart Disease: 11 % (02/07/2009)  Labs Reviewed: K+: 4.6 (05/10/2009) Creat: : 0.90 (05/10/2009)   Chol: 111 (05/10/2009)   HDL: 35 (05/10/2009)   LDL: 58 (  05/10/2009)   TG: 89 (05/10/2009)  Orders: Est. Patient Level IV (54098)  Problem # 5:  HYPERLIPIDEMIA (ICD-272.4) At goal. His updated medication list for this problem includes:    Lopid 600 Mg Tabs (Gemfibrozil) .Marland Kitchen... Take 1 tablet by mouth two times a day  Labs Reviewed: SGOT: 18 (05/10/2009)   SGPT: 12 (05/10/2009)  Lipid Goals: Chol Goal: 200 (02/07/2009)   HDL Goal: 40 (02/07/2009)   LDL Goal: 100 (02/07/2009)   TG Goal: 150 (02/07/2009)  Prior 10 Yr Risk Heart Disease: 11 % (02/07/2009)   HDL:35 (05/10/2009), 41 (01/24/2009)  LDL:58 (05/10/2009), 79 (01/24/2009)  Chol:111 (05/10/2009), 155 (01/24/2009)  Trig:89 (05/10/2009), 177 (01/24/2009)  Problem # 6:  SHOULDER STRAIN (ICD-840.9) conservative managment  Lipid Assessment/Plan:      Based on NCEP/ATP III, the patient's risk factor category is "history of diabetes".  The patient's lipid goals are as follows: Total cholesterol goal is 200; LDL cholesterol goal is 100; HDL cholesterol goal is 40; Triglyceride goal is 150.  His LDL cholesterol  goal has been met.    Appended Document: Orders Update    Clinical Lists Changes  Problems: Added new problem of DEPRESSION/ANXIETY (ICD-300.4)

## 2010-04-04 NOTE — Miscellaneous (Signed)
Summary: HIV-1 RNA,CD4 (RESEARCH)  Clinical Lists Changes  Observations: Added new observation of CD4 COUNT: 375 microliters (05/10/2009 15:34) Added new observation of HIV1RNA QA: 39 copies/mL (05/10/2009 15:34)

## 2010-04-04 NOTE — Letter (Signed)
Summary: Meals Records  Meals Records   Imported By: Florinda Marker 04/18/2009 14:46:29  _____________________________________________________________________  External Attachment:    Type:   Image     Comment:   External Document

## 2010-04-04 NOTE — Assessment & Plan Note (Signed)
Summary: LAB APPT FOR KIM E    Current Allergies: ! PENICILLIN ! BACTRIM DS (SULFAMETHOXAZOLE-TRIMETHOPRIM)  Other Orders: T-Hgb A1C (in-house) [phone]) T-RPR (Syphilis) [phone])  Process Orders Check Orders Results:     [facility]: ABN not required for this insurance Tests Sent for requisitioning (August 31, 2009 8:55 AM):     08/31/2009: [facility] -- T-RPR (Syphilis) [phone] (signed)    Appended Document: LAB APPT FOR KIM E  Laboratory Results   Blood Tests   Date/Time Received: sign Mariea Clonts  August 31, 2009 11:04 AM   Date/Time Reported: Mariea Clonts  August 31, 2009 11:04 AM   HGBA1C: 5.4%   (Normal Range: Non-Diabetic - 3-6%   Control Diabetic - 6-8%)

## 2010-04-04 NOTE — Assessment & Plan Note (Signed)
Summary: STUDY APPT/ LH    Current Allergies: ! PENICILLIN ! BACTRIM DS (SULFAMETHOXAZOLE-TRIMETHOPRIM) Vital Signs:  Patient profile:   55 year old male Height:      69.5 inches (176.53 cm) Weight:      190.8 pounds (86.73 kg) Temp:     97.7 degrees F oral Pulse rate:   60 / minute Resp:     16 per minute BP sitting:   118 / 76  (left arm) Is Patient Diabetic? Yes Did you bring your meter with you today? No    Other Orders: Est. Patient Research Study [id number]) T-Basic Metabolic Panel [phone]) T-Lipid Profile [phone]) T-Phosphorus [id number]) T-Hepatic Function [id number]) T-Hepatitis B Surface Antigen [phone]) T-Hepatitis B Core Antibody [id number]) T-Hepatitis C Antibody ([id number]) T-Hepatitis A Antibody ([id number]) T-Urine Protein [id number]) T-Urinalysis ([id number]) T-Urine Creatinine ([id number])  Process Orders Check Orders Results:     [facility]: ABN not required for this insurance Tests Sent for requisitioning (August 31, 2009 1:05 PM):     08/31/2009: [facility] -- T-Basic Metabolic Panel [phone] (signed)     08/31/2009: [facility] -- T-Lipid Profile [phone] (signed)     08/31/2009: [facility] -- T-Phosphorus [[id number]] (signed)     08/31/2009: [facility] -- T-Hepatic Function [phone] (signed)     08/31/2009: [facility] -- T-Hepatitis B Surface Antigen [[id number]] (signed)     08/31/2009: [facility] -- T-Hepatitis B Core Antibody [[id number]] (signed)     08/31/2009: [facility] -- T-Hepatitis C Antibody [[id number]] (signed)     08/31/2009: [facility] -- T-Hepatitis A Antibody [[id number]] (signed)     08/31/2009: [facility] -- T-Urine Protein [id number] (signed)     08/31/2009: [facility] -- T-Urinalysis [[id number]]  (signed)     08/31/2009: [facility] -- T-Urine Creatinine [[id number]] (signed)

## 2010-04-04 NOTE — Progress Notes (Signed)
Summary: We need records from his Neurologist  Phone Note Call from Patient   Caller: Patient Call For: Paulette Blanch Dam MD Summary of Call: Paient called upset because [facility] hasn't sent the information to release him so Dr. Daiva Eves cand sign off on the dmv paperwork. Can this form possibly be filled out without [facility]? I called twice and have not heard back from the office. Initial call taken by: Starleen Arms CMA,  December 30, 2009 2:44 PM  Follow-up for Phone Call        We definitely need [facility] to send [place] their records or a letter stating that they have cleared him medically from cause of his day of amnesia. I was not involved with that workup and was unware of the entire event and reason for his losing his licence until he told me of this in the office visit. If need be we can ask the on call Neurologist to look into this but there should be someone in their clinic who can get to the bottom of this Follow-up by: Acey Lav MD,  January 02, 2010 8:25 AM

## 2010-04-04 NOTE — Miscellaneous (Signed)
Summary: Orders Update  Clinical Lists Changes  Orders: Added new Test order of T-Urine Microalbumin w/creat. ratio [phone]) - Signed     Process Orders Check Orders Results:     [facility]: ABN not required for this insurance Order queued for requisitioning for [facility]: April 18, 2009 12:38 PM  Tests Sent for requisitioning (April 18, 2009 12:38 PM):     04/18/2009: [facility] -- T-Urine Microalbumin w/creat. ratio [[id number]] (signed)

## 2010-04-04 NOTE — Miscellaneous (Signed)
  Clinical Lists Changes  Observations: Added new observation of CD4 COUNT: 465 microliters (01/24/2009 9:57) Added new observation of HIV1RNA QA: 39 copies/mL (01/24/2009 9:57)

## 2010-04-04 NOTE — Assessment & Plan Note (Signed)
Summary: DM TRAINING/VS    Vital Signs:  Patient profile:   55 year old male Weight:      196 pounds BMI:     28.63 Is Patient Diabetic? Yes Did you bring your meter with you today? Yes CBG Result 103 CBG Device ID [facility] Comments 2 slice bread- 20 carbs- onions, steak, boren castle sauce total ~35 grams carb per patient 2 hours ago, with water   Allergies: 1)  ! Penicillin 2)  ! Bactrim Ds (Sulfamethoxazole-Trimethoprim)  Diabetes Management Exam:    Foot Exam (with socks and/or shoes not present):       Sensory-Monofilament:          Left foot: normal          Right foot: normal   Complete Medication List: 1)  Truvada 200-300 Mg Tabs (Emtricitabine-tenofovir) .Marland Kitchen.. 1 by mouth daily 2)  Isentress 400 Mg Tabs (Raltegravir potassium) .Marland Kitchen.. 1 by mouth bid 3)  Synthroid 25 Mcg Tabs (Levothyroxine sodium) .... Take one tablet on on empty stomach daily 4)  Lopid 600 Mg Tabs (Gemfibrozil) .... Take 1 tablet by mouth two times a day 5)  Ativan 1 Mg Tabs (Lorazepam) .... Take one half to to one whole tablet at night as needed for anxiety 6)  Lisinopril 40 Mg Tabs (Lisinopril) .... Take 1 tablet by mouth once a day (you will need your bloodwork checked one week after going to the higher dose) 7)  Hydrochlorothiazide 12.5 Mg Caps (Hydrochlorothiazide) .... Take 1 tablet by mouth once a day 8)  Metformin Hcl 1000 Mg Tabs (Metformin hcl) .... Twice a day with breakfast and dinner  Other Orders: DSMT(Medicare) Individual, 30 Minutes (G0108)  Last LDL:                                                 58 (05/10/2009 7:38:00 PM)        Diabetic Foot Exam Foot Inspection Is there a history of a foot ulcer?              No Is there a foot ulcer now?              No Can the patient see the bottom of their feet?          Yes Are the shoes appropriate in style and fit?          Yes Is there swelling or an abnormal foot shape?          Yes Are the toenails long?                 No Are the toenails thick?                No Are the toenails ingrown?              No Is there heavy callous build-up?              No Is there a claw toe deformity?                          No Is there elevated skin temperature?            No Is there limited ankle dorsiflexion?            No Is there  foot or ankle muscle weakness?            No Do you have pain in calf while walking?           No      Diabetic Foot Care Education :Patient educated on appropriate care of diabetic feet.  Pulse Check          Right Foot          Left Foot Posterior Tibial:        present            couldn't feel Dorsalis Pedis:        present            present Comments:  discussed with patient- High risk because of bunion deformities, also asked patient to have doctor do pulses High Risk Feet? Yes Set Next Diabetic Foot Exam here: 11/16/2009   10-g (5.07) Semmes-Weinstein Monofilament Test Performed by: Jamison Neighbor RD          Right Foot          Left Foot Visual Inspection     normal          Test Control      normal         normal Site 1         normal         normal Site 2         normal         normal Site 3         normal         normal Site 4         normal         normal Site 5         normal         normal Site 6         normal         normal Site 7         normal         normal Site 8         normal         normal Site 9         normal         normal Site 10         normal         normal  Impression      normal         normal   Diabetes Self Management Training  PCP: Acey Lav MD Date diagnosed with diabetes: 03/07/2009 Diabetes Type: Type 2 non-insulin Other persons present: no Current smoking Status: never  Vital Signs Todays Weight: 196lb  in BMI 28.63in-lbs   Diabetes Medications:  Comments: brought meter- he is testing after large amounts of food to assure himelf that he is still dong okay- suggested he could follow A1C instead of self moniroitng to cut out  this expense. Brought excellent food record to be scanned. contiues to decrease weight- encouraged continued loss until closer to healthy weight for height.     Monitoring Self monitoring blood glucose  ~ 1  times a week Name of Meter  [facility]  Other Assessment Had an amputation due to diabetes? No Ever had a foot ulcer or infection?           No Performs daily self-foot exams: Yes   [employer] for Low Blood  sugar No  Last Physician foot exam:  08/16/2009 Next foot exam due: 11/16/2009   Nutrition assessment Weight change: Loss ETOH : Yes Amount per day: once in a while How many meals per week do you eat away from home?   rarely eat away from home Who does the food shopping? You Who does the cooking?  You Do you add salt to food? Yes- sea salt While cooking?  Yes What beverages do you drink?  Milk, diet soda, water Do you read food labels?                                                                          Yes What do you look at?                                                                                                                 carbs, fat, - trans and sat fats Biggest challenge to eating healthy: doing pretty good  Activity Limitations  Appropriate physical activity Would you  say you are physically active: Yes  Type of physical activity  Yardwork  Housework  Other  painting Diabetes Disease Process  Discussed today Define diabetes in simple terms: Demonstrates competency Medications  Nutritional Management  Monitoring State target blood glucose and HgbA1C goals: Demonstrates competency Complications Glucose control and the development/prevention of long-term complications: Demonstrates competencyState benefits-risks-and options for improving blood sugar control: Demonstrates competencyB/P and lipid control in the prevention/control of cardiovascular disease: Demonstrates competency Exercise  Lifestyle changes:Goal setting and Problem  solving Identify lifestyle behaviors that need to change: Demonstrates competencyDevelop strategies to reduce risk factors: Demonstrates competencyVerbalize need for and frequency of health care follow-up: Demonstrates competencyList at least two appropriate community resources: Demonstrates competencyIdentify Family/SO role in managing diabetes: Demonstrates competencyDiabetes Management Education Done: 08/16/2009    BEHAVIORAL GOAL FOLLOW UP Preventing,detecting and treating complications: yearly eye exam and daily foot checks      Foot exam done today- [facility] for eye exam- micral is optional as he is already doing necessary things to preent nephropathy and is tkaing and ACE. will request and A1C be added to next labs.  patient says he called to cancel and was told Juanell Fairly would cover this visit.   Diabetes Self Management Support: still feels that he needs ot do this alone.  Discussed support plans again today- patient content with clinic visits as support Follow-up:annually, or if needed sooner   Appended Document: Orders Update    Clinical Lists Changes  Orders: Added new Test order of T-Hgb A1C (in-house) [phone]) - Signed      Appended Document: annual eye exam for dm/dmr    Phone Note Outgoing Call   Call placed by: Jamison Neighbor  RD,CDE,  August 19, 2009 2:06 PM Summary of Call: Called patient and left message that we cannot put patient on [facility] eye exan list because he is [facility]: He can get an eye exam once he gets [employer] in 02/2010. Asked him to have the report of his yee exam sent to his PCP office.

## 2010-04-04 NOTE — Assessment & Plan Note (Signed)
Summary: STUDY APPT/ LH    Current Allergies: ! PENICILLIN ! BACTRIM DS (SULFAMETHOXAZOLE-TRIMETHOPRIM) Vital Signs:  Patient profile:   55 year old male Weight:      191.75 pounds (87.16 kg) BMI:     28.01 Temp:     98.0 degrees F oral Pulse rate:   76 / minute Resp:     16 per minute BP sitting:   124 / 84  (left arm) Is Patient Diabetic? Yes Did you bring your meter with you today? No Pain Assessment Patient in pain? no      Nutritional Status BMI of 25 - 29 = overweight  Does patient need assistance? Functional Status Self care Ambulation Normal   Patient here today for week 112 study visit. he c/o night sweats that started about 2-3 months ago. Also, has noticed an increase in burning pain in his feet that comes and goes. He continues to have joint pains and anxiety. He will return in February for the next study visit.Deirdre Evener RN  December 26, 2009 9:26 AM   Other Orders: Est. Patient Research Study [id number]) T-Basic Metabolic Panel [phone]) T-Phosphorus [phone]) T-Lipid Profile [phone]) T-Hepatic Function [phone])

## 2010-04-04 NOTE — Assessment & Plan Note (Signed)
Summary: DM TRAINING/VS   Vital Signs:  Patient profile:   55 year old male Weight:      225.6 pounds BMI:     32.96 Is Patient Diabetic? Yes Did you bring your meter with you today? No CBG Result 186 CBG Device ID [employer]   Allergies: 1)  ! Penicillin 2)  ! Bactrim Ds (Sulfamethoxazole-Trimethoprim)   Complete Medication List: 1)  Truvada 200-300 Mg Tabs (Emtricitabine-tenofovir) .Marland Kitchen.. 1 by mouth daily 2)  Isentress 400 Mg Tabs (Raltegravir potassium) .Marland Kitchen.. 1 by mouth bid 3)  Synthroid 25 Mcg Tabs (Levothyroxine sodium) .... Take one tablet on on empty stomach daily 4)  Lopid 600 Mg Tabs (Gemfibrozil) .... Take 1 tablet by mouth two times a day 5)  Ativan 1 Mg Tabs (Lorazepam) .... Take one half to to one whole tablet at night as needed for anxiety 6)  Lisinopril 40 Mg Tabs (Lisinopril) .... Take 1 tablet by mouth once a day (you will need your bloodwork checked one week after going to the higher dose) 7)  Hydrochlorothiazide 12.5 Mg Caps (Hydrochlorothiazide) .... Take 1 tablet by mouth once a day 8)  Metformin Hcl 500 Mg Tabs (Metformin hcl) .... Take 1 tablet by mouth two times a day for one week, then two tablets in the am and one in pm then two tablets twice daily  Other Orders: DSMT(Medicare) Individual, 30 Minutes (G0108)  Patient Instructions: 1)  make a follow-up in 3-4 weeks 2)  keep a reocrd on notebook paper of what you eat and drink for 3 days by reading lables 3)  call me with any questionsLupita Leash- [phone]  Diabetes Self Management Training  PCP: Paulette Blanch Dam MD Date diagnosed with diabetes: 03/07/2009 Diabetes Type: Type 2 non-insulin Current smoking Status: never  Vital Signs Todays Weight: 225.6lb  in BMI 32.96in-lbs   Assessment Work Hours: Not currently working Daily activities: chops wood when not too cold, willing to walk, but bunions on feet hurt Sources of Support: does not name on- denies his mother is support.  Affect:  Appropriate  Potential Barriers  Economic/Supplies  Support  Needs review/assistance  Coping with Diabetes Feelings about Diabetes: Aware  Diabetes Medications:  Comments: has [facility] rel[facility] meter - is having a difficult time affording strips    Monitoring Name of Meter  [facility] [employer]  Recent Episodes of: Requiring Help from another person  Hyperglycemia : No Hypoglycemia: No Severe Hypoglycemia : No  Other Assessment Had an amputation due to diabetes? No Ever had a foot ulcer or infection?           No Performs daily self-foot exams: No     Estimated /Usual Carb Intake Breakfast # of Carbs/Grams pancakes or soup or cold cereal, skim milk Lunch # of Carbs/Grams soup, ritz crackers x10, water Midafternoon # of Carbs/Grams yogurt- lite Dinner # of Carbs/Grams meat,starch  Nutrition assessment Weight change: Loss Amount of change: 5 pounds Desired Weight: 165-170, 210-220 short term ETOH : Yes Amount per day: once in a while How many meals per week do you eat away from home?   1-2 If you eat away from home , what type of restaurant do you choose?  Fast food What beverages do you drink?  Water Do you read food labels?  Yes What do you look at?                                                                                                                 fat and sugars Biggest challenge to eating healthy: Eating too much- says prednisone made him very hungry and caused weight gain  Activity Limitations  Inadequate physical activity  Type of physical activity  Yardwork Length of time: # of times per week: 2 Diabetes Disease Process  Discussed today Define diabetes in simple terms: No knowledge   State own type of diabetes: Needs review/assistanceState diabetes is treated by meal plan-exercise-medication-monitoring-education: Demonstrates competency Medications State  name-action-dose-duration-side effects-and time to take medication: No knowledge   State appropriate timing of food related to medication: Demonstrates competency Nutritional Management Identify what foods most often affect blood glucose: Needs review/assistance    Verbalize importance of controlling food portions: Needs review/assistanceState importance of spacing and not omitting meals and snacks: Needs review/assistanceState changes planned for home meals/snacks: Needs review/assistance    Monitoring State purpose and frequency of monitoring BG-ketones-HgbA1C  : Needs review/assistancePerform glucose monitoring/ketone testing and record results correctly: Demonstrates competencyState target blood glucose and HgbA1C goals: Needs review/assistance    Complications State the causes-signs and symptoms and prevention of Hyperglycemia: Needs review/assistanceExplain proper treatment of hyperglycemia: Needs review/assistanceState the principles of skin-dental and foot care: Demonstrates competency   Describe symptoms of skin and foot problems and describe foot exam: Demonstrates competency    Exercise States importance of exercise: Needs review/assistanceStates effect of exercise on blood glucose: No knowledgeVerbalizes safety measures for exercise related to diabetes: Needs review/assistance Lifestyle changes:Goal setting and Problem solving State benefits of making appropriate lifestyle changes: Needs review/assistance   Identify lifestyle behaviors that need to change: Needs review/assistance   Identify risk factors that interfere with health: Demonstrates competencyDevelop strategies to reduce risk factors: Needs review/assistance   Verbalize need for and frequency of health care follow-up: Needs review/assistance   List at least two appropriate community resources: No knowledgeIdentify Family/SO role in managing diabetes: No knowledge Psychosocial Adjustment State three common feelings that  might be experienced when learning to cope with diabetes: Needs review/assistanceIdentify two methods to cope with these feelings: Needs review/assistanceIdentify how stress affects diabetes & two sources of stress: Needs review/assistanceName two ways of obtaining support from family/friends: Needs review/assistanceDiabetes Management Education Done: 03/21/2009    BEHAVIORAL GOALS INITIAL Incorporating appropriate nutritional management: limit carbs by keep[ing reocrd and learning amount of carbs in food I eat        Encouraged physical activity,but suggested he get better fitting shoes wiht bigger toebox. Current shoes are too narrow and pointed.  Diabetes Self Management Support: patient motivated to prevfent further medication need and to decrease his weight. Discussed support plans today. patient currently plans to keep visits with CDE. will discuss support groups. Follow-up:3-4 weeks

## 2010-04-04 NOTE — Assessment & Plan Note (Signed)
Summary: STUDY APPT/ LH    Current Allergies: ! PENICILLIN ! BACTRIM DS (SULFAMETHOXAZOLE-TRIMETHOPRIM) Vital Signs:  Patient profile:   55 year old male Weight:      210.2 pounds (95.55 kg) BMI:     30.71 Temp:     97.0 degrees F oral Pulse rate:   62 / minute Resp:     16 per minute BP sitting:   115 / 78  (left arm) Is Patient Diabetic? Yes Did you bring your meter with you today? No Pain Assessment Patient in pain? no      Nutritional Status BMI of > 30 = obese  Does patient need assistance? Functional Status Self care Ambulation Normal   Patient here for week 80 study visit. He has lost 20 lbs. since his last visit dieting and watching what he eats with Lupita Leash Riley's help. No new problems or concerns. He has an appt. to see Dr. Daiva Eves next month.Deirdre Evener RN  May 10, 2009 10:55 AM    Other Orders: Est. Patient Research Study [id number]) T-Basic Metabolic Panel [id number]) T-Hepatic Function [id number]) T-Lipid Profile [id number]) T-Phosphorus [id number]) Process Orders Check Orders Results:     [facility]: ABN not required for this insurance Tests Sent for requisitioning (May 10, 2009 10:54 AM):     05/10/2009: [facility] -- T-Basic Metabolic Panel [phone] (signed)     05/10/2009: [facility] -- T-Hepatic Function [phone] (signed)     05/10/2009: [facility] -- T-Lipid Profile [id number] (signed)     05/10/2009: [facility] -- T-Phosphorus [phone] (signed)
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
## 2010-04-06 NOTE — Miscellaneous (Signed)
Summary: RW Financial Update  Clinical Lists Changes  Observations: Added new observation of PAYOR: Medicare (02/28/2010 10:14) Added new observation of PCTFPL: 136.84  (02/28/2010 10:14) Added new observation of FINASSESSDT: 02/22/2010  (02/28/2010 10:14) Added new observation of YEARLYEXPEN: 1400  (02/28/2010 10:14)

## 2010-04-12 NOTE — Progress Notes (Signed)
  Phone Note Outgoing Call   Call placed by: Acey Lav MD,  April 03, 2010 8:39 AM
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
## 2010-04-17 ENCOUNTER — Encounter: Payer: Self-pay | Admitting: Infectious Disease

## 2010-04-17 ENCOUNTER — Ambulatory Visit (INDEPENDENT_AMBULATORY_CARE_PROVIDER_SITE_OTHER): Payer: Self-pay

## 2010-04-17 ENCOUNTER — Ambulatory Visit (INDEPENDENT_AMBULATORY_CARE_PROVIDER_SITE_OTHER): Payer: MEDICARE | Admitting: Infectious Disease

## 2010-04-17 DIAGNOSIS — B20 Human immunodeficiency virus [HIV] disease: Secondary | ICD-10-CM

## 2010-04-17 DIAGNOSIS — J984 Other disorders of lung: Secondary | ICD-10-CM

## 2010-04-17 DIAGNOSIS — F341 Dysthymic disorder: Secondary | ICD-10-CM

## 2010-04-17 LAB — CONVERTED CEMR LAB
Albumin: 5 g/dL (ref 3.5–5.2)
CD4 Count: 522 microliters
CO2: 25 meq/L (ref 19–32)
Chlamydia, Swab/Urine, PCR: NEGATIVE
Chloride: 101 meq/L (ref 96–112)
Creatinine, Ser: 1.12 mg/dL (ref 0.40–1.50)
GC Probe Amp, Urine: NEGATIVE
Glucose, Bld: 96 mg/dL (ref 70–99)
HDL: 41 mg/dL (ref >39)
Hgb A1c MFr Bld: 5.4 % (ref <5.7)
LDL Cholesterol: 52 mg/dL (ref 0–99)
Phosphorus: 3 mg/dL (ref 2.3–4.6)
Sodium: 137 meq/L (ref 135–145)
Total Bilirubin: 0.3 mg/dL (ref 0.3–1.2)
Total CHOL/HDL Ratio: 2.9

## 2010-04-20 ENCOUNTER — Encounter (INDEPENDENT_AMBULATORY_CARE_PROVIDER_SITE_OTHER): Payer: Self-pay | Admitting: *Deleted

## 2010-04-26 NOTE — Assessment & Plan Note (Signed)
Summary: STUDY APPT   Vital Signs:  Patient profile:   55 year old male Weight:      200.5 pounds (91.14 kg)  Allergies: 1)  ! Penicillin 2)  ! Bactrim Ds (Sulfamethoxazole-Trimethoprim)   Other Orders: Est. Patient Research Study [id number]) T-Basic Metabolic Panel [id number]) T-Hepatic Function [id number]) T-Lipid Profile [id number]) T-Phosphorus [phone])   Orders Added: 1)  Est. Patient Research Study [04200] 2)  T-Basic Metabolic Panel [[id number]] 3)  T-Hepatic Function [[id number]] 4)  T-Lipid Profile [[id number]] 5)  T-Phosphorus [[id number]]

## 2010-04-26 NOTE — Miscellaneous (Addendum)
  Clinical Lists Changes 
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
## 2010-05-11 NOTE — Miscellaneous (Signed)
Summary: HIV-1 RNA, CD4 (RESEARCH)  Clinical Lists Changes  Observations: Added new observation of CD4 COUNT: 522 microliters (04/17/2010 10:43) Added new observation of CD4 %: 18 % (04/17/2010 10:43) Added new observation of HIV1RNA QA: 39 copies/mL (04/17/2010 10:43)

## 2010-05-17 ENCOUNTER — Encounter: Payer: Self-pay | Admitting: Infectious Disease

## 2010-05-17 ENCOUNTER — Telehealth: Payer: Self-pay | Admitting: Infectious Disease

## 2010-05-17 ENCOUNTER — Other Ambulatory Visit: Payer: Self-pay | Admitting: Infectious Disease

## 2010-05-17 DIAGNOSIS — R911 Solitary pulmonary nodule: Secondary | ICD-10-CM

## 2010-05-19 ENCOUNTER — Ambulatory Visit (HOSPITAL_COMMUNITY)
Admission: RE | Admit: 2010-05-19 | Discharge: 2010-05-19 | Disposition: A | Discharge status: Home / Self Care | Payer: MEDICARE | Source: Ambulatory Visit | Attending: Infectious Disease | Admitting: Infectious Disease

## 2010-05-19 DIAGNOSIS — Z21 Asymptomatic human immunodeficiency virus [HIV] infection status: Secondary | ICD-10-CM | POA: Insufficient documentation

## 2010-05-19 DIAGNOSIS — K8020 Calculus of gallbladder without cholecystitis without obstruction: Secondary | ICD-10-CM | POA: Insufficient documentation

## 2010-05-19 DIAGNOSIS — J984 Other disorders of lung: Secondary | ICD-10-CM | POA: Insufficient documentation

## 2010-05-19 DIAGNOSIS — I517 Cardiomegaly: Secondary | ICD-10-CM | POA: Insufficient documentation

## 2010-05-19 DIAGNOSIS — R911 Solitary pulmonary nodule: Secondary | ICD-10-CM

## 2010-05-19 MED ORDER — IOHEXOL 300 MG/ML  SOLN
100.0000 mL | Freq: Once | INTRAMUSCULAR | Status: AC | PRN
  Administered 2010-05-19: 100 mL via INTRAVENOUS

## 2010-05-23 NOTE — Miscellaneous (Signed)
Summary: Pt needs CT scan  Clinical Lists Changes  Orders: Added new Test order of CT with Contrast (CT w/ contrast) - Signed

## 2010-05-23 NOTE — Consult Note (Signed)
Summary: [facility] 01/29/10  [facility] 01/29/10   Imported By: Florinda Marker 05/17/2010 14:08:00  _____________________________________________________________________  External Attachment:    Type:   Image     Comment:   External Document

## 2010-05-26 ENCOUNTER — Telehealth: Payer: Self-pay | Admitting: Infectious Disease

## 2010-05-26 DIAGNOSIS — R911 Solitary pulmonary nodule: Secondary | ICD-10-CM

## 2010-05-26 NOTE — Telephone Encounter (Signed)
I reviewed the pts CT scan from [facility] as well as the followup scan here. He had cavitation in November that has become more thin walled within one of his lower lobes. I dont think this is TB, however I do think we should find out IF he is coughing and we should nonetheless get a PPD, Quantiferon gold, crypto ag and histoplasma ag on him and if he is coughing rule him out for TB with sputa. If he is coughing we will likely need to get in touch with local health dept to help facilitate rule out for TB

## 2010-05-29 ENCOUNTER — Telehealth: Payer: Self-pay | Admitting: Infectious Disease

## 2010-05-29 ENCOUNTER — Encounter: Payer: Self-pay | Admitting: Infectious Disease

## 2010-05-29 NOTE — Telephone Encounter (Signed)
I am contacting [employer] to get induced sputa and PPD placed

## 2010-05-30 NOTE — Telephone Encounter (Signed)
Pt has gone to [place] [place] to have AFB cultures and PPD. He is still worried about his DMV and licence. I have written letter re the transient memory loss in 2009. If vision is a problem we will have to readdress this when he gets different lenses

## 2010-05-30 NOTE — Telephone Encounter (Signed)
This is duplicate note

## 2010-06-06 ENCOUNTER — Telehealth: Payer: Self-pay | Admitting: Licensed Clinical Social Worker

## 2010-06-06 DIAGNOSIS — F419 Anxiety disorder, unspecified: Secondary | ICD-10-CM

## 2010-06-06 NOTE — Telephone Encounter (Signed)
Patient would like for his lorazepam to be to changed xanax because it's not working for him anymore.

## 2010-06-06 NOTE — Progress Notes (Signed)
Summary: request for CT scan on disk from [facility]  Phone Note Outgoing Call   Call placed by: Starleen Arms CMA,  May 17, 2010 2:42 PM Call placed to: [facility] medical records  Summary of Call: I called [facility] and left voicemail message to request CT of chest done on 01/29/2010 to be put on a CD and mailed to Dr. Daiva Eves. I asked their office to call me and confirm if they will be sending it or not. Patient also wants a rx for allergies, the zyrtec,and allegra are both otc now and he can't afford it  [facility] [place] Initial call taken by: Starleen Arms CMA,  May 17, 2010 2:44 PM  Follow-up for Phone Call        does he want an inhaled nasal steroid. Pretty much ALL of the anti allergy drugs are otc now Follow-up by: Acey Lav MD,  May 17, 2010 11:04 PM  Additional Follow-up for Phone Call Additional follow up Details #1::        He doesn't mind, just whatever he can use his rx card for. Additional Follow-up by: Starleen Arms CMA,  May 18, 2010 3:55 PM

## 2010-06-07 ENCOUNTER — Other Ambulatory Visit: Payer: Self-pay | Admitting: Licensed Clinical Social Worker

## 2010-06-07 ENCOUNTER — Other Ambulatory Visit: Payer: Self-pay | Admitting: *Deleted

## 2010-06-07 DIAGNOSIS — I10 Essential (primary) hypertension: Secondary | ICD-10-CM

## 2010-06-07 DIAGNOSIS — E119 Type 2 diabetes mellitus without complications: Secondary | ICD-10-CM

## 2010-06-07 DIAGNOSIS — E039 Hypothyroidism, unspecified: Secondary | ICD-10-CM

## 2010-06-07 MED ORDER — METFORMIN HCL 1000 MG PO TABS: 1000.0000 mg | ORAL_TABLET | Freq: Two times a day (BID) | ORAL | Status: DC

## 2010-06-07 MED ORDER — ALPRAZOLAM 0.5 MG PO TABS: 0.5000 mg | ORAL_TABLET | Freq: Three times a day (TID) | ORAL | Status: DC | PRN

## 2010-06-07 MED ORDER — HYDROCHLOROTHIAZIDE 12.5 MG PO CAPS: 12.5000 mg | ORAL_CAPSULE | Freq: Every day | ORAL | Status: DC

## 2010-06-07 MED ORDER — LEVOTHYROXINE SODIUM 25 MCG PO TABS: 25.0000 ug | ORAL_TABLET | Freq: Every day | ORAL | Status: DC

## 2010-06-07 NOTE — Telephone Encounter (Signed)
Addended by: Acey Lav on: 06/07/2010 12:08 PM   Modules accepted: Orders

## 2010-07-06 ENCOUNTER — Other Ambulatory Visit: Payer: Self-pay | Admitting: *Deleted

## 2010-07-06 DIAGNOSIS — F419 Anxiety disorder, unspecified: Secondary | ICD-10-CM

## 2010-07-06 MED ORDER — ALPRAZOLAM 0.5 MG PO TABS: 0.5000 mg | ORAL_TABLET | Freq: Three times a day (TID) | ORAL | Status: DC | PRN

## 2010-07-07 ENCOUNTER — Other Ambulatory Visit: Payer: Self-pay | Admitting: Infectious Disease

## 2010-07-11 DIAGNOSIS — E1169 Type 2 diabetes mellitus with other specified complication: Secondary | ICD-10-CM | POA: Insufficient documentation

## 2010-07-14 NOTE — Telephone Encounter (Signed)
Pt needs to make appt at Center for further refills.  Call [phone], option 2 for appt.

## 2010-07-18 NOTE — Discharge Summary (Signed)
Jonathan Estes, Jonathan Estes               ACCOUNT NO.:  [id number]   MEDICAL RECORD NO.:  [id number]          PATIENT TYPE:  INP   LOCATION:  5522                         FACILITY:  [facility]   PHYSICIAN:  Jonathan I Elsaid, MD      DATE OF BIRTH:  March 12, 1955   DATE OF ADMISSION:  09/29/2007  DATE OF DISCHARGE:  10/11/2007                               DISCHARGE SUMMARY   PRIMARY CARE PHYSICIAN:  Mudlogger.   DISCHARGE DIAGNOSES:  1. 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  2. Human immunodeficiency virus/acquired immune deficiency syndrome,      CD4 10.  3. Oral thrush, resolved.  4. Hyperglycemia, possible diabetes mellitus.  5. Leukopenia and normocytic anemia secondary to human      immunodeficiency virus.  6. Hyponatremia felt to be secondary to syndrome of inappropriate      antidiuretic hormone secondary to pneumonia.   ALLERGIES:  SULFA allergy.   DISCHARGE MEDICATIONS:  1. Dapsone 100 mg p.o. daily for 9 days.  2. Trimethoprim 400 mg p.o. t.i.d. for 9 days, then to continue      dapsone 100 mg p.o. daily for PCP prophylaxis.  3. Prednisone 40 mg for 4 days, 20 mg for 4 days, 20 mg for 3 days,      then stop.  4. Zithromax 1200 mg p.o. weekly.  5. Insulin Lantus 15 units subcu nightly, the patient also provided      with Accu-Chek machine.   FOLLOWUP:  1. The patient needs to follow up with Infectious Disease Clinic with      Dr. Daiva Eves on Tuesday, October 14, 2007, at 3:30 p.m.  2. The patient needs to follow up with his primary care physician, the      need for insulin after stopping the steroid, and he need to check      his sodium with his primary care physician.   CONSULTATION:  Infectious Disease, Dr. Cliffton Asters.   HOSPITAL PROCESS AND COURSE:  1. Pneumocystis carinii pneumonia, methicillin-sensitive staph aureus.      The patient is confirmed to have Pneumocystis  carinii and staph      aureus which is methicillin sensitive which is also sensitive to      Bactrim.  Accordingly, the patient was placed on Bactrim IV.  He      was subsequently switched to p.o., but the patient developed some      generalized skin rash which was felt secondary to SULFA allergy.      Accordingly,  Infectious Disease evaluated the patient and      recommended to discontinue Bactrim and to continue dapsone 100 mg      for another 10 days to complete 21 days of PCP treatment.  Also, to      continue with trimethoprim.  The patient continued to have episodes      of hypoxia, especially when walking, accordingly prednisone      continued.  Today, the patient's oxygen saturation remained above      100%  on room air and during ambulation also.  The patient denies      any shortness of breath, and we did not feel the patient required      any oxygen.  The patient completely informed about requirement to      follow up with Dr. Daiva Eves on Tuesday, October 14, 2007, at 3 p.m.      and about importance of compliance with his medications.  He need      to be on dapsone 100 mg daily till another CD4 check and to      continue with Zithromax.  2. HIV/AIDS.  The patient needs to follow up in the Infectious Disease      Clinic to restart HIV medications and to check genotype.  3. His skin rash completely resolved secondary to SULFA allergy.  4. Hyponatremia felt to be secondary to the lung process, and the      patient is asymptomatic.  The patient needs to follow up with his      primary care physician to repeat his BMET.  5. Steroid versus diabetes mellitus-induced hyperglycemia.  The      patient at this time will be discharged on insulin Lantus 15 units      subcu nightly.  The patient informed about how to check his      insulin, his fingersticks, and about the symptom of hyperglycemia.      Also, the patient informed he need to follow up with his primary      care physician after  tapering his steroid and for possibility of      stopping insulin Lantus if he did not need it.  DVT and GI      prophylaxis.   It was felt that the patient was medically stable to be discharged home.  Followup with the Infectious Disease Clinic and follow up with his  primary care physician next week.      Jonathan Bosie Helper, MD  Electronically Signed     HIE/MEDQ  D:  10/11/2007  T:  10/12/2007  Job:  [id number]

## 2010-07-18 NOTE — Consult Note (Signed)
Jonathan Estes, Jonathan Estes               ACCOUNT NO.:  [id number]   MEDICAL RECORD NO.:  [id number]          PATIENT TYPE:  INP   LOCATION:  3302                         FACILITY:  [facility]   PHYSICIAN:  Jonathan Milch, MD      DATE OF BIRTH:  Aug 20, 1955   DATE OF CONSULTATION:  DATE OF DISCHARGE:                                 CONSULTATION   REFERRING PHYSICIAN:  Dr. Daiva Estes from Infectious Disease.   REASON FOR CONSULTATION:  Pneumonia with HIV rule out PCP.   HISTORY OF PRESENT ILLNESS:  Jonathan Estes is a 55 year old Caucasian  gentleman who presented with 55-month history of chronic cough, worsening  dyspnea, and fevers.  He was noted to be febrile to 188 in the emergency  room with saturation of 92% on room air.  He had oral thrush.  Subsequently, he tested HIV positive and CD-4 count was noted to be 10.  Chest x-ray showed multifocal pneumonia with predominant left-sided  infiltrates and left right upper lobe infiltrates.  He describes 3  sexual partners in last 10 years including men.  He describes 40-pound  weight loss in the last 3 months.  There is no hemoptysis, no history of  exposure to TB contacts.  He denies IV drug use.   PAST MEDICAL HISTORY:  Includes removal of anal condyloma.   PAST SURGICAL HISTORY:  None.   ALLERGIES:  None.   SOCIAL HISTORY:  Smoke pot until 3 weeks ago and cocaine, but denies  tobacco or alcohol use.  There is no history of IV drug use.  He is  bisexual.  He works as a Naval architect for [employer].   MEDICATIONS:  Prior to admission, none.  Currently includes Tamiflu,  Bactrim, Solu-Medrol, guaifenesin, subcu heparin, SSI, Protonix,  albuterol, Atrovent, Rocephin, azithromycin, and Diflucan.   ALLERGIES:  PENICILLIN.   PHYSICAL EXAM:  GENERAL:  Older gentleman sitting up in bed in mild  respiratory distress, able to speak in full sentences, appears  malnourished and pale.  VITAL SIGNS:  Respirations 24 per minute, heart rate 78 per  minute,  blood pressure 118/74.  NECK:  Supple, no JVD, no lymphadenopathy.  CVS:  S1, S2 normal.  CHEST:  Few crackles in the left lower lobe, otherwise clear.  No  rhonchi.  Oxygen saturation 88% on 5 L nasal cannula.  EXTREMITIES:  No edema.  ABDOMEN:  Soft, nontender, no organomegaly.  NEUROLOGIC:  Nonfocal.   LABORATORY DATA:  WBC count 2.7, hemoglobin 9.9, platelets 172, BUN and  creatinine 60 and 0.1, sodium 135, potassium 3.5, bicarbonate 25.  Anion  gap of 10.  Microbiological data so far, respiratory culture inadequate,  RPR negative, HIV positive, H1N1 pending.   IMPRESSION:  1. Multilobar pneumonia with hypoxemic respiratory failure.  2. New diagnosis of human immunodeficiency virus with low CD-4 count      and thrush being in acquired immune deficiency syndrome defining      unless.   RECOMMENDATIONS:  1. I agree with empiric therapy for pneumocystic pneumonia and      community-acquired pneumonia.  I would  also give him steroids given      his degree of hypoxia.  2. The risks and benefits of bronchoscopy were discussed with the      patient in detail and he evidenced understanding.  The risk of      coughing, bleeding, and the small chance of lung puncture requiring      a chest tube were discussed in detail.  I explained to him the need      to obtain specimens for PCP, so that therapy could be directed      towards this.  He is agreeable to the procedure, and we will      proceed as soon as a room is available for the procedure.   Thank you Dr. Daiva Estes for involving [place] in the care of this patient.  We  will assist with his management during his hospital stay.      Jonathan Milch, MD  Electronically Signed     RVA/MEDQ  D:  10/01/2007  T:  10/02/2007  Job:  [id number]

## 2010-07-18 NOTE — H&P (Signed)
[name], [name] NO.:  [id number]   MEDICAL RECORD NO.:  [id number]          PATIENT TYPE:  INP   LOCATION:  4702                         FAC[facility]   PHYSICIAN:  [facility], M.D.    DATE OF BIRTH:  [birth year]   DATE OF ADMISSION:  09/29/2007  DATE OF DISCHARGE:                              HISTORY & PHYSICAL   PRIMARY CARE [facility]:  [facility]es, [place], at [facility].  The patient is unassigned to [place].   CHIEF COMPLAINT:  Shortness of breath and cough.   HISTORY OF PRESENT ILLNESS:  The patient is a 55 year old man with no  significant past medical history, who presents to the emergency  department with a 2 to 3 week history of progressive shortness of breath  and cough.  The patient's cough has been intermittently productive and  dry.  When the cough is productive, his sputum is generally white to  clear.  He has shortness of breath now at rest and with activity.  He  denies shortness of breath when laying flat. He also denies chest pain,  pleurisy, or unusual swelling in his legs.  He has had a fever of 101  degrees Fahrenheit at home.  He has also had chills but no night sweats.  He had diarrhea 2-3 weeks ago but none within the past week.  His nose  has been a little runny but not necessarily out of the ordinary for him.  He denies any known sick contacts.  He denies abdominal pain, nausea, or  vomiting.  He denies headache.  He denies sore throat.  He does have  hemorrhoids which will bleed occasionally, but not lately.  He does not  smoke tobacco but admits to smoking marijuana on a daily basis.  However, over the past 2 weeks,  he has been too sick to smoke  marijuana.  When questioned about his sex life, he readily admits that  he has sexual intercourse with both men and women.  The patient also  discloses that he has had weight loss of approximately 40-50 pounds over  the past 6 months.   During the evaluation in the  emergency department, the patient is noted  to be febrile with a temperature of 100.8 and tachycardiac with a heart  rate of 134 beats per minute.  He is oxygenating 92% on room air.  His  chest x-ray reveals multifocal pneumonia bilaterally.  His lab data are  significant for hemoglobin of 11.7 and a serum sodium of 130.  The  patient will be admitted for further evaluation and management.   PAST MEDICAL HISTORY:  1. Status post anal wart excision years ago.  2. Marijuana use.   MEDICATIONS:  1. Mucinex over the counter twice daily as needed.  2. Over-the-counter sinus medication every 4 hours as needed.  3. Prostate pill.  He has not taken this medication in 2 weeks.   ALLERGIES:  The patient has an allergy to PENICILLIN; however, he does  not recall the reaction.   SOCIAL HISTORY:  The patient is single.  He does have  sex with both men  and women.  He is currently living with his mother in [place];  however, he is from [place].  He is unemployed.  He smokes  marijuana daily when he is well.  He has used cocaine in the past, but  now his cocaine use is rare to infrequent.  He denies alcohol use and  tobacco use.  He has no children.   FAMILY HISTORY:  His mother is 43 years of age and has no chronic  medical conditions.  His father died of colon cancer.   REVIEW OF SYSTEMS:  As above in history present illness.   PHYSICAL EXAMINATION:  VITAL SIGNS:  Temperature 100.8, blood pressure  108/70, pulse rate 134 repeated at 100, respiratory rate is 20, oxygen  saturation 92% on room air.  GENERAL: The patient is a pleasant, alert 55 year old Caucasian man who  is currently sitting up in bed in no acute distress.  However, he does  appear ill.  HEENT:  Head is normocephalic, nontraumatic.  Pupils equal, round,  reactive to light.  Extraocular muscles are intact.  Conjunctivae are  clear.  Sclerae are white.  Tympanic membranes are mildly obscured by  cerumen  bilaterally but no acute changes seen.  Nasal mucosa is mildly  dry.  No sinus tenderness.  No active rhinorrhea.  OROPHARYNX:  There is scattered, white exudate on the tongue.  No  posterior pharyngeal exudates or erythema.  Mucous membranes are mildly  dry.  NECK:  Neck  is supple.  No adenopathy, no thyromegaly, no bruit or JVD.  LUNGS:  Decreased breath sounds in the bases, but no audible wheezes or  crackles auscultated.  Breathing is mildly labored.  HEART:  S1, S2 with borderline tachycardia.  ABDOMEN:  Mildly obese, positive bowel sounds, soft, nontender,  nondistended.  No hepatosplenomegaly, no masses palpated.  RECTAL AND GU: Deferred.  EXTREMITIES:  Pedal pulses 2+ bilaterally.  No pretibial edema.  No  pedal edema.  NEUROLOGIC:  The patient is alert and oriented x3.  Cranial nerves II-  XII are intact.  Strength is 5/5 throughout.  Sensation is intact.   ADMISSION LABORATORIES:  WBC 6.7, hemoglobin 11.7, MCV 81.5, platelets  232.  PT 13.4, INR 1.0, PTT 33.  Sodium 130, potassium 3.8, chloride 95,  CO2 is 28, glucose 121, BUN 6, creatinine 0.69, calcium 8.40, total  protein 7.7, albumin 2.3, AST 32.   Chest x-ray:  Findings consistent with multifocal pneumonia.   ASSESSMENT:  1. Multifocal pneumonia.  Given the patient's clinical scenario,      Pneumocystis pneumonia is at the top of the differential diagnosis.  2. Oral thrush.  The patient says that he was tested for HIV 10-15      years ago and he was negative.  3. Tachycardia.  The tachycardia is sinus by exam. It is secondary to      not only the fever but the underlying infection.  4. Mild normocytic anemia.  The patient's hemoglobin is 11.7.  The      anemia may be the consequence of acute-on-chronic disease; although      he does have hemorrhoidal bleeding by history.  5. Hyponatremia.  The patient's serum sodium is 130.  This may be the      consequence of volume depletion.  6. Chronic marijuana use.    PLAN:  1. The patient will be admitted for further evaluation and management.  2. Will check blood cultures, sputum cultures for  not only routine      bacteria, but also for PCP.  Will also check a viral hepatitis      panel, RPR, HIV ELISA, CD-4 count, and PPD.  3. Will check an urine Legionella antigen.  4. Will start intravenous Bactrim empirically along with a steroid      taper and Diflucan intravenously.  5. Oxygen therapy, supportive care, IV fluid volume repletion, etc.      will be started.  6. The patient will be placed on droplet precautions.  Will also start      Tamiflu empirically. H1N1 PCR will be ordered.  7. Substance abuse counselling.      [facility], M.D.  Electronically Signed     DF/MEDQ  D:  09/29/2007  T:  09/29/2007  Job:  [id number]

## 2010-07-18 NOTE — Discharge Summary (Signed)
[facility], [facility]               ACCOUNT NO.:  [id number]   MEDICAL RECORD NO.:  [id number]          PATIENT TYPE:  INP   LOCATION:  5509                         FACILITY:  [facility]   PHYSICIAN:  Elliot Cousin, M.D.    DATE OF BIRTH:  12-May-1955   DATE OF ADMISSION:  09/29/2007  DATE OF DISCHARGE:                               DISCHARGE SUMMARY   DATE OF DISCHARGE:  To be determined.   DISCHARGE DIAGNOSES:  1. Bilateral multifocal pneumonia, secondary to Pneumocystis and      methicillin sensitive staph aureus.  AFB smears are negative,      cultures are pending.  H1N1 infection was ruled out.  2. Hypoxia secondary to #1.  The patient's oxygen saturation ranges      from 75% to 85% with oxygen supplementation concomitant with      activity; however, his oxygen saturations are generally in the mid      to upper 90s on 4 or 5 liters of oxygen.  3. 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      CD4 count is 10 and viral load is 261,000.  4. Oral thrush.  5. Hyperglycemia secondary to steroids.  6. Status post diagnostic bronchoscopy by Dr. Vassie Loll on October 01, 2007.  7. Leukopenia and normocytic anemia secondary to human      immunodeficiency virus.   DISCHARGE MEDICATIONS:  To be dictated at the time of hospital  discharge.   CONSULTATION:  1. Infectious disease physician, Dr. Paulette Blanch Dam.  2. Pulmonologist, Dr. Vassie Loll.   PROCEDURES PERFORMED:  1. Bronchoscopy performed on October 01, 2007 by Dr. Vassie Loll.  The results      revealed upper airway normal, vocal cords normal, left lower lobe      infiltrate, clear secretions, status post bronchial lavage from the      left lower lobe.  Cytology revealed no malignant cells.  The      transbronchial pathology revealed scant bronchial epithelium.  2. Chest x-ray on October 02, 2007.  The results revealed atelectasis and      infiltrative density within the left lung, appears similar to      previous study.  There is  slight improved aeration of the right      lung with minimal residual haziness and increased markings      medially.   HISTORY OF PRESENT ILLNESS:  The patient is a 55 year old man with no  significant past medical history who presented to the emergency  department on September 29, 2007 with the chief complaint of a 2 to 3 week  history of progressive shortness of breath, cough, and generalized  weakness.  The patient also reported a 40-50 pound weight loss over the  previous six months.  When he was evaluated in the emergency department,  he was noted to be febrile with a temperature of 100.8 and tachycardic  with a heart rate of 134 beats per minute.  He was initially oxygenating  92% on room air.  His chest x-ray revealed multifocal pneumonia  bilaterally.  The patient was subsequently admitted for further  evaluation  and management.   For additional details, please see the dictated history and physical.   HOSPITAL COURSE:  1. Pneumocystis pneumonia and methicillin-sensitive staph aureus      pneumonia.  At the time of the initial hospital assessment, there      was a high clinical suspicion for Pneumocystis pneumonia.      Therefore, the patient was started on intravenous Bactrim and      intravenous Solu-Medrol.  The patient had evidence of oral thrush      and therefore, Diflucan was administered as well.  Given the recent      outbreak of swine flu, the patient was placed on droplet isolation      and was subsequently started on treatment with Tamiflu 75 mg b.i.d.      for five days.  For further evaluation, sputum cultures were      ordered for various tests as well as blood cultures and a nasal      swab for H1N1 PCR.  An STD screen was ordered including an acute      viral hepatitis panel, RPR, HIV, and a CD4 count.  The following      day the patient was started on additional antibiotic therapy with      Rocephin and azithromycin to cover typical causes of pneumonia.  An       addition to the sputum specimen sent for routine culture and PCP,      specimens were sent for AFB.  Subsequently, infectious diseases      physician, Dr. Daiva Eves, was consulted.  He recommended a pulmonary      consultation for a diagnostic bronchoscopy.  Dr. Daiva Eves also      ordered a number of other studies to assess for other potential      concomitant infections.  Dr. Vassie Loll performed the bronchoscopy on      October 01, 2007 successfully.  The bronchial specimens were sent for      specific culturing and additional diagnostic studies.   The patient's HIV antibody was indeed positive.  His CD4 count was found  to be quite low at 10.  Subsequently, his viral load was found to be  261,000.  Blood cultures have remained negative during the  hospitalization.  The viral hepatitis panel was completely normal.  Urine Legionella antigen was negative.  The H1N1 PCR was negative.  The  Cryptococcal antigen was negative.  The RPR was nonreactive.  The  initial sputum Pneumocystis smear was negative; however, the bronchial  specimen was actually positive for PCP.  The respiratory culture  eventually grew out abundant methicillin-sensitive staph aureus.  So  far, 3 AFB smears have been negative.   Over the course of the hospitalization, the patient's antibiotic therapy  changed.  Because of worsening hypoxia, the Rocephin and azithromycin  were discontinued and antibiotic treatment was broadened with Primaxin  and vancomycin.  However, when the respiratory culture revealed  methicillin-sensitive staph aureus, the vancomycin as well as the  Primaxin were discontinued.  The methicillin-sensitive staph aureus is  covered adequately with intravenous Bactrim.  Today Dr. Daiva Eves  restarted Zithromax for prophylactic treatment of MAC.  The patient has  been transitioned to prednisone at 40 mg b.i.d. Both the Primaxin and  vancomycin have been discontinued.  The patient has received five days  of Tamiflu  and the treatment will be stopped today.   The patient continues to desaturate, particularly when he attempts to  ambulate.  He is still requiring a range of 4 to 6 liters of nasal  cannula oxygen just to maintain his oxygen saturations in the mid 90s.  It is possible that the patient will require home oxygen.  The patient  will probably require at least another week or two of antibiotic therapy  with Bactrim along with a slow taper of prednisone.  Upon discharge,  hopefully recommendations will be made for further antibiotic treatment  if needed by Dr. Daiva Eves and colleagues.  1. HIV/AIDS.  There was a high clinical suspicion for HIV infection.      As indicated above, the patient was HIV positive. His viral load      was found to be 261,000 and his CD4 count was 10.  Dr. Daiva Eves      ordered an HIV genotype and the result is pending.  No      antiretroviral therapy has been initiated during this      hospitalization as recommended by Dr. Daiva Eves.  Zithromax has been      started for MAC prophylaxis.  The patient will complete five days      of Diflucan therapy for oral thrush today. He will be set up for an      appointment to follow up in the infectious disease clinic upon      discharge as discussed with the infectious disease team.  2. Hyperglycemia secondary to steroids.  The patient's capillary blood      glucose became elevated during the hospitalization as a consequence      of steroid therapy.  Sliding scale NovoLog as well as Lantus were      started for treatment.  If the patient continues on a long taper of      prednisone, he may require short-term Lantus therapy outpatient.      The registered nurse has been instructed to provide the patient      with education and instruction with regards to self-insulin      administration.  The patient will probably not require sliding      scale NovoLog at home.   DISCHARGE DISPOSITION:  The patient is clinically improving. However,  he  is not quite ready for hospital discharge yet.  Airborne isolation has  been discontinued today as the patient's AFB smears as well as H1N1 PCR  has been negative.  Given his ongoing hypoxia, the patient may require  home oxygen.  Upon discharge, arrangements will be made by the ID team  to have the patient follow up in the infectious disease clinic and to  acquire a new primary care physician with the teaching service.      Elliot Cousin, M.D.  Electronically Signed     DF/MEDQ  D:  10/04/2007  T:  10/04/2007  Job:  [id number]

## 2010-07-18 NOTE — Discharge Summary (Signed)
[name], [facility]               ACCOUNT NO.:  [id number]   MEDICAL RECORD NO.:  [id number]          PATIENT TYPE:  INP   LOCATION:  3302                         FACILITY:  [facility]   PHYSICIAN:  Oretha Milch, MD      DATE OF BIRTH:  [birth year]   DATE OF ADMISSION:  09/29/2007  DATE OF DISCHARGE:                               DISCHARGE SUMMARY   PROCEDURE PERFORMED:  Bronchoscopy.   INDICATION FOR THE PROCEDURE:  Bilateral pneumonia in this 55 year old  nonsmoker with HIV and low CD4 count, rule out PCP and other  opportunistic infections.  Rest of the procedure including coughing,  bleeding, and the small chance of lung puncture requiring a chest tube  were discussed with the patient in great detail prior to procedure, any  evidence understanding.  Bronchoscope was inserted from the left nare  because the right nare was too tight.  A 4 mg of Versed and 100 mcg of  fentanyl were used and divided doses for sedation.  Upper airway  appeared normal.  Vocal cord showed normal appearance in motion.  Some  thrush was noticed on the posterior pharyngeal wall.  All segmental and  subsegmental airways were patent, thick, slimy.  Clear secretions were  sectioned out from the left-sided airways.  Bronchoalveolar lavage was  obtained from the left lower lobe.  Under fluoroscopy, transbronchial  biopsies were obtained x3 from the left lower lobe.  The patient  tolerated procedure well.  A chest x-ray will be performed to rule out  evidence of pneumothorax.      Oretha Milch, MD  Electronically Signed     RVA/MEDQ  D:  10/01/2007  T:  10/02/2007  Job:  [id number]

## 2010-07-25 ENCOUNTER — Other Ambulatory Visit: Payer: Self-pay | Admitting: Infectious Disease

## 2010-07-25 DIAGNOSIS — Z789 Other specified health status: Secondary | ICD-10-CM

## 2010-07-26 ENCOUNTER — Other Ambulatory Visit: Payer: Self-pay | Admitting: Licensed Clinical Social Worker

## 2010-07-26 DIAGNOSIS — Z789 Other specified health status: Secondary | ICD-10-CM

## 2010-07-26 MED ORDER — CETIRIZINE HCL 10 MG PO TABS
10.0000 mg | ORAL_TABLET | Freq: Every day | ORAL | Status: DC
Start: 2010-07-26 — End: 2010-08-10

## 2010-07-26 NOTE — Telephone Encounter (Signed)
He called to report that he tripped over dog last Saturday & fell, injuring his l rib cage. It is bruised. Pain is 10/10 & waking him at night.denies breathing problems. He asked if he should see a doctor. I agreed he should. He states he is going to [facility]. States he has been there before. Told him they do have an xray machine there. He will go today

## 2010-07-27 ENCOUNTER — Other Ambulatory Visit: Payer: Self-pay | Admitting: *Deleted

## 2010-07-27 DIAGNOSIS — F419 Anxiety disorder, unspecified: Secondary | ICD-10-CM

## 2010-07-27 MED ORDER — ALPRAZOLAM 0.5 MG PO TABS: 0.5000 mg | ORAL_TABLET | Freq: Three times a day (TID) | ORAL | Status: DC | PRN

## 2010-08-07 ENCOUNTER — Other Ambulatory Visit: Payer: Self-pay | Admitting: Licensed Clinical Social Worker

## 2010-08-07 DIAGNOSIS — F419 Anxiety disorder, unspecified: Secondary | ICD-10-CM

## 2010-08-07 MED ORDER — ALPRAZOLAM 0.5 MG PO TABS: 0.5000 mg | ORAL_TABLET | Freq: Three times a day (TID) | ORAL | Status: DC | PRN

## 2010-08-09 ENCOUNTER — Other Ambulatory Visit: Payer: Self-pay | Admitting: Licensed Clinical Social Worker

## 2010-08-09 DIAGNOSIS — I10 Essential (primary) hypertension: Secondary | ICD-10-CM

## 2010-08-09 MED ORDER — LISINOPRIL 40 MG PO TABS: 40.0000 mg | ORAL_TABLET | Freq: Every day | ORAL | Status: DC

## 2010-08-10 ENCOUNTER — Other Ambulatory Visit: Payer: Self-pay | Admitting: *Deleted

## 2010-08-10 DIAGNOSIS — E039 Hypothyroidism, unspecified: Secondary | ICD-10-CM

## 2010-08-10 DIAGNOSIS — B20 Human immunodeficiency virus [HIV] disease: Secondary | ICD-10-CM

## 2010-08-10 DIAGNOSIS — Z789 Other specified health status: Secondary | ICD-10-CM

## 2010-08-10 DIAGNOSIS — I10 Essential (primary) hypertension: Secondary | ICD-10-CM

## 2010-08-10 DIAGNOSIS — E119 Type 2 diabetes mellitus without complications: Secondary | ICD-10-CM

## 2010-08-10 MED ORDER — METFORMIN HCL 1000 MG PO TABS: 1000.0000 mg | ORAL_TABLET | Freq: Two times a day (BID) | ORAL | Status: DC

## 2010-08-10 MED ORDER — CETIRIZINE HCL 10 MG PO TABS: 10.0000 mg | ORAL_TABLET | Freq: Every day | ORAL | Status: DC

## 2010-08-10 MED ORDER — RALTEGRAVIR POTASSIUM 400 MG PO TABS: 400.0000 mg | ORAL_TABLET | Freq: Two times a day (BID) | ORAL | Status: DC

## 2010-08-10 MED ORDER — HYDROCHLOROTHIAZIDE 12.5 MG PO CAPS: 12.5000 mg | ORAL_CAPSULE | Freq: Every day | ORAL | Status: DC

## 2010-08-10 MED ORDER — EMTRICITABINE-TENOFOVIR DF 200-300 MG PO TABS: 1.0000 | ORAL_TABLET | Freq: Every day | ORAL | Status: DC

## 2010-08-10 MED ORDER — LEVOTHYROXINE SODIUM 25 MCG PO TABS: 25.0000 ug | ORAL_TABLET | Freq: Every day | ORAL | Status: DC

## 2010-08-10 MED ORDER — LISINOPRIL 40 MG PO TABS: 40.0000 mg | ORAL_TABLET | Freq: Every day | ORAL | Status: DC

## 2010-08-10 MED ORDER — GEMFIBROZIL 600 MG PO TABS: 600.0000 mg | ORAL_TABLET | Freq: Two times a day (BID) | ORAL | Status: DC

## 2010-08-14 ENCOUNTER — Ambulatory Visit (INDEPENDENT_AMBULATORY_CARE_PROVIDER_SITE_OTHER): Payer: Medicare Other | Admitting: *Deleted

## 2010-08-14 VITALS — BP 100/70 | HR 70 | Temp 97.6°F | Resp 16 | Ht 69.0 in | Wt 195.5 lb

## 2010-08-14 DIAGNOSIS — B20 Human immunodeficiency virus [HIV] disease: Secondary | ICD-10-CM

## 2010-08-14 LAB — HEPATIC FUNCTION PANEL
Albumin: 5.1 g/dL (ref 3.5–5.2)
Total Bilirubin: 0.5 mg/dL (ref 0.3–1.2)
Total Protein: 8.2 g/dL (ref 6.0–8.3)

## 2010-08-14 LAB — LIPID PANEL
Cholesterol: 121 mg/dL (ref 0–200)
HDL: 35 mg/dL — ABNORMAL LOW (ref >39)
Total CHOL/HDL Ratio: 3.5 Ratio
Triglycerides: 94 mg/dL (ref <150)

## 2010-08-14 LAB — POCT URINALYSIS DIPSTICK
Bilirubin, UA: NEGATIVE
Glucose, UA: NEGATIVE
Ketones, UA: NEGATIVE
Nitrite, UA: NEGATIVE
pH, UA: 5.5

## 2010-08-14 LAB — BASIC METABOLIC PANEL
BUN: 37 mg/dL — ABNORMAL HIGH (ref 6–23)
CO2: 26 mEq/L (ref 19–32)
Calcium: 10 mg/dL (ref 8.4–10.5)
Creat: 1.34 mg/dL (ref 0.50–1.35)
Glucose, Bld: 102 mg/dL — ABNORMAL HIGH (ref 70–99)
Sodium: 139 mEq/L (ref 135–145)

## 2010-08-14 LAB — PHOSPHORUS: Phosphorus: 3.7 mg/dL (ref 2.3–4.6)

## 2010-08-14 NOTE — Progress Notes (Signed)
[facility] is here for his week 144 study visit. He denies any new problems. Continues to c/o nasal drainage., weakness and fatigue. He has lost some weight and has been very careful with his diet and substance use. There is some numbness in his feet R > L and his feet ache occasionally. He will return in 4 months for the next study visit.

## 2010-08-15 LAB — PROTEIN, URINE, RANDOM: Total Protein, Urine: 19 mg/dL

## 2010-08-31 ENCOUNTER — Encounter: Payer: Self-pay | Admitting: Infectious Disease

## 2010-08-31 LAB — CD4/CD8 (T-HELPER/T-SUPPRESSOR CELL)
CD4%: 18.5
CD4: 537
CD8 % Suppressor T Cell: 55.4

## 2010-08-31 LAB — HIV-1 RNA QUANT-NO REFLEX-BLD: HIV-1 RNA Viral Load: <40

## 2010-09-04 ENCOUNTER — Ambulatory Visit: Payer: Medicare Other | Admitting: Infectious Disease

## 2010-09-13 ENCOUNTER — Ambulatory Visit (INDEPENDENT_AMBULATORY_CARE_PROVIDER_SITE_OTHER): Payer: Medicare Other | Admitting: Infectious Disease

## 2010-09-13 ENCOUNTER — Encounter: Payer: Self-pay | Admitting: Infectious Disease

## 2010-09-13 VITALS — BP 114/73 | HR 69 | Temp 98.2°F | Ht 68.0 in | Wt 192.0 lb

## 2010-09-13 DIAGNOSIS — J984 Other disorders of lung: Secondary | ICD-10-CM

## 2010-09-13 DIAGNOSIS — E039 Hypothyroidism, unspecified: Secondary | ICD-10-CM

## 2010-09-13 DIAGNOSIS — B20 Human immunodeficiency virus [HIV] disease: Secondary | ICD-10-CM

## 2010-09-13 LAB — T4, FREE: Free T4: 1.04 ng/dL (ref 0.80–1.80)

## 2010-09-13 LAB — TSH: TSH: 1.857 u[IU]/mL (ref 0.350–4.500)

## 2010-09-13 NOTE — Progress Notes (Signed)
  Subjective:    Patient ID: [facility], male    DOB: Aug 01, 1955, 55 y.o.   MRN: [id number]  HPI  [facility] is doing quite well his viral load is completely suppressed his CD4 count is quite healthy. He has been able to really obtain his driver's license and has now a motorcycle which he is using. He has no specific complaints today.  Review of Systems  Constitutional: Negative for fever, chills, diaphoresis, activity change, appetite change, fatigue and unexpected weight change.  HENT: Negative for congestion, sore throat, rhinorrhea, sneezing, trouble swallowing and sinus pressure.   Eyes: Negative for photophobia and visual disturbance.  Respiratory: Negative for cough, chest tightness, shortness of breath, wheezing and stridor.   Cardiovascular: Negative for chest pain, palpitations and leg swelling.  Gastrointestinal: Negative for nausea, vomiting, abdominal pain, diarrhea, constipation, blood in stool, abdominal distention and anal bleeding.  Genitourinary: Negative for dysuria, hematuria, flank pain and difficulty urinating.  Musculoskeletal: Negative for myalgias, back pain, joint swelling, arthralgias and gait problem.  Skin: Negative for color change, pallor, rash and wound.  Neurological: Negative for dizziness, tremors, weakness and light-headedness.  Hematological: Negative for adenopathy. Does not bruise/bleed easily.  Psychiatric/Behavioral: Negative for behavioral problems, confusion, sleep disturbance, dysphoric mood, decreased concentration and agitation.       Objective:   Physical Exam  Constitutional: He is oriented to person, place, and time. He appears well-developed and well-nourished. No distress.  HENT:  Head: Normocephalic and atraumatic.  Mouth/Throat: Oropharynx is clear and moist. No oropharyngeal exudate.  Eyes: Conjunctivae and EOM are normal. Pupils are equal, round, and reactive to light. No scleral icterus.  Neck: Normal range of motion. Neck  supple. No JVD present.  Cardiovascular: Normal rate, regular rhythm and normal heart sounds.  Exam reveals no gallop and no friction rub.   No murmur heard. Pulmonary/Chest: Effort normal and breath sounds normal. No respiratory distress. He has no wheezes. He has no rales. He exhibits no tenderness.  Abdominal: He exhibits no distension and no mass. There is no tenderness. There is no rebound and no guarding.  Musculoskeletal: He exhibits no edema and no tenderness.  Lymphadenopathy:    He has no cervical adenopathy.  Neurological: He is alert and oriented to person, place, and time. He has normal reflexes. He exhibits normal muscle tone. Coordination normal.  Skin: Skin is warm and dry. He is not diaphoretic. No erythema. No pallor.  Psychiatric: He has a normal mood and affect. His behavior is normal. Judgment and thought content normal.          Assessment & Plan:  HIV INFECTION Continue isentress and truvada, excellent control  HYPOTHYROIDISM, BORDERLINE Recheck TSH and free T4  PULMONARY NODULE Sputum were sent for AFB cultures were negative. He does not have any clinical evidence of disease this point in time we may repeat imaging in the future.

## 2010-09-13 NOTE — Assessment & Plan Note (Signed)
Recheck TSH and free T4 

## 2010-09-13 NOTE — Assessment & Plan Note (Signed)
Sputum were sent for AFB cultures were negative. He does not have any clinical evidence of disease this point in time we may repeat imaging in the future.

## 2010-09-13 NOTE — Assessment & Plan Note (Signed)
Continue isentress and truvada, excellent control

## 2010-10-09 ENCOUNTER — Encounter: Payer: Self-pay | Admitting: Infectious Disease

## 2010-11-27 ENCOUNTER — Ambulatory Visit (INDEPENDENT_AMBULATORY_CARE_PROVIDER_SITE_OTHER): Payer: Medicare Other | Admitting: *Deleted

## 2010-11-27 VITALS — BP 118/78 | HR 64 | Temp 97.4°F | Resp 16 | Wt 199.2 lb

## 2010-11-27 DIAGNOSIS — Z23 Encounter for immunization: Secondary | ICD-10-CM

## 2010-11-27 DIAGNOSIS — Z299 Encounter for prophylactic measures, unspecified: Secondary | ICD-10-CM

## 2010-11-27 DIAGNOSIS — Z21 Asymptomatic human immunodeficiency virus [HIV] infection status: Secondary | ICD-10-CM

## 2010-11-27 DIAGNOSIS — B20 Human immunodeficiency virus [HIV] disease: Secondary | ICD-10-CM

## 2010-11-27 LAB — BASIC METABOLIC PANEL
CO2: 24 mEq/L (ref 19–32)
Calcium: 9.7 mg/dL (ref 8.4–10.5)
Creat: 0.95 mg/dL (ref 0.50–1.35)
Sodium: 140 mEq/L (ref 135–145)

## 2010-11-27 LAB — HEPATIC FUNCTION PANEL
ALT: 9 U/L (ref 0–53)
AST: 17 U/L (ref 0–37)
Albumin: 4.9 g/dL (ref 3.5–5.2)
Alkaline Phosphatase: 72 U/L (ref 39–117)
Total Bilirubin: 0.3 mg/dL (ref 0.3–1.2)

## 2010-11-27 LAB — LIPID PANEL
Cholesterol: 117 mg/dL (ref 0–200)
HDL: 31 mg/dL — ABNORMAL LOW (ref >39)

## 2010-11-27 NOTE — Progress Notes (Signed)
Patient here for week 160 study visit. He denies any new problems. Continues to get tired easily with exertion and has numbness in his feet. I suggested that he get his testosterone checked. Has been very adherent with all of his meds. Will return in January for the next study visit.

## 2010-12-01 LAB — CBC
HCT: 29.2 — ABNORMAL LOW
HCT: 34.6 — ABNORMAL LOW
HCT: 40.2
Hemoglobin: 10.3 — ABNORMAL LOW
Hemoglobin: 11.1 — ABNORMAL LOW
Hemoglobin: 11.7 — ABNORMAL LOW
Hemoglobin: 9.9 — ABNORMAL LOW
Hemoglobin: 13.4
MCHC: 32.8
MCHC: 33.7
MCHC: 33.8
MCHC: 34
MCHC: 33.3
MCHC: 33.3
MCV: 81.2
MCV: 82.7
MCV: 82.6
MCV: 84.3
Platelets: 232
Platelets: 178
Platelets: 247
RBC: 3.76 — ABNORMAL LOW
RBC: 3.97 — ABNORMAL LOW
RBC: 4.02 — ABNORMAL LOW
RDW: 13.5
RDW: 13.6
RDW: 15

## 2010-12-01 LAB — COMPREHENSIVE METABOLIC PANEL
AST: 29
AST: 32
AST: 18
Albumin: 2.9 — ABNORMAL LOW
BUN: 6
BUN: 12
CO2: 25
CO2: 28
Calcium: 8.4
Calcium: 9.3
Chloride: 100
Creatinine, Ser: 0.69
Creatinine, Ser: 0.72
Creatinine, Ser: 0.76
GFR calc Af Amer: >60
GFR calc Af Amer: >60
GFR calc Af Amer: >60
GFR calc non Af Amer: >60
GFR calc non Af Amer: >60
Glucose, Bld: 290 — ABNORMAL HIGH
Sodium: 130 — ABNORMAL LOW
Total Bilirubin: 0.4
Total Protein: 7.7
Total Protein: 7.7

## 2010-12-01 LAB — AFB CULTURE WITH SMEAR (NOT AT ARMC)
Acid Fast Smear: NONE SEEN
Acid Fast Smear: NONE SEEN

## 2010-12-01 LAB — BASIC METABOLIC PANEL
BUN: 11
BUN: 21
CO2: 24
CO2: 25
CO2: 27
Calcium: 8.5
Calcium: 8.7
Chloride: 99
Chloride: 88 — ABNORMAL LOW
Chloride: 95 — ABNORMAL LOW
Creatinine, Ser: 0.64
Creatinine, Ser: 0.72
GFR calc Af Amer: >60
GFR calc Af Amer: >60
GFR calc Af Amer: >60
GFR calc non Af Amer: >60
GFR calc non Af Amer: >60
Glucose, Bld: 80
Potassium: 4.3
Potassium: 4.4
Sodium: 131 — ABNORMAL LOW
Sodium: 133 — ABNORMAL LOW
Sodium: 126 — ABNORMAL LOW

## 2010-12-01 LAB — P CARINII SMEAR DFA

## 2010-12-01 LAB — URINALYSIS, ROUTINE W REFLEX MICROSCOPIC
Bilirubin Urine: NEGATIVE
Glucose, UA: 500 — AB
Ketones, ur: NEGATIVE
Nitrite: NEGATIVE
pH: 6.5

## 2010-12-01 LAB — BLOOD GAS, ARTERIAL
Acid-Base Excess: 0.6
Bicarbonate: 24.1 — ABNORMAL HIGH
FIO2: 0.5
O2 Saturation: 94.6
TCO2: 25.2
pCO2 arterial: 34.9 — ABNORMAL LOW
pO2, Arterial: 74.9 — ABNORMAL LOW

## 2010-12-01 LAB — CULTURE, RESPIRATORY W GRAM STAIN

## 2010-12-01 LAB — CULTURE, BLOOD (ROUTINE X 2): Culture: NO GROWTH

## 2010-12-01 LAB — LEGIONELLA ANTIGEN, URINE: Legionella Antigen, Urine: NEGATIVE

## 2010-12-01 LAB — DIFFERENTIAL
Basophils Absolute: 0
Eosinophils Absolute: 0.7
Eosinophils Relative: 1
Eosinophils Relative: 6 — ABNORMAL HIGH
Lymphocytes Relative: 14
Lymphocytes Relative: 6 — ABNORMAL LOW
Lymphs Abs: 0.9
Monocytes Absolute: 0.9
Monocytes Relative: 14 — ABNORMAL HIGH
Neutrophils Relative %: 71

## 2010-12-01 LAB — HIV-1 RNA QUANT-NO REFLEX-BLD
HIV 1 RNA Quant: 261000 copies/mL — ABNORMAL HIGH (ref <50)
HIV-1 RNA Quant, Log: 5.42 — ABNORMAL HIGH (ref <1.70)

## 2010-12-01 LAB — FUNGUS CULTURE W SMEAR: Fungal Smear: NONE SEEN

## 2010-12-01 LAB — LEGIONELLA PROFILE(CULTURE+DFA/SMEAR): Legionella Antigen (DFA): NEGATIVE

## 2010-12-01 LAB — URINE CULTURE: Special Requests: NEGATIVE

## 2010-12-01 LAB — EXPECTORATED SPUTUM ASSESSMENT W GRAM STAIN, RFLX TO RESP C

## 2010-12-01 LAB — GLUCOSE, RANDOM: Glucose, Bld: 354 — ABNORMAL HIGH

## 2010-12-01 LAB — PROTIME-INR
INR: 1
Prothrombin Time: 13.4

## 2010-12-01 LAB — CRYPTOCOCCAL ANTIGEN: Crypto Ag: NEGATIVE

## 2010-12-01 LAB — TSH: TSH: 0.992

## 2010-12-01 LAB — HIV-1 GENOTYPR PLUS

## 2010-12-01 LAB — LACTATE DEHYDROGENASE: LDH: 308 — ABNORMAL HIGH

## 2010-12-01 LAB — HEPATITIS PANEL, ACUTE: HCV Ab: NEGATIVE

## 2010-12-01 LAB — H1N1 SCREEN (PCR)

## 2010-12-04 ENCOUNTER — Other Ambulatory Visit: Payer: Self-pay | Admitting: *Deleted

## 2010-12-04 DIAGNOSIS — E119 Type 2 diabetes mellitus without complications: Secondary | ICD-10-CM

## 2010-12-04 MED ORDER — GEMFIBROZIL 600 MG PO TABS: 600.0000 mg | ORAL_TABLET | Freq: Two times a day (BID) | ORAL | Status: DC

## 2010-12-27 ENCOUNTER — Encounter: Payer: Self-pay | Admitting: Infectious Disease

## 2010-12-27 LAB — CD4/CD8 (T-HELPER/T-SUPPRESSOR CELL)
CD4%: 22.1
CD8: 1251

## 2011-01-29 ENCOUNTER — Encounter: Payer: Self-pay | Admitting: Infectious Disease

## 2011-01-29 ENCOUNTER — Ambulatory Visit (INDEPENDENT_AMBULATORY_CARE_PROVIDER_SITE_OTHER): Payer: Medicare Other | Admitting: Infectious Disease

## 2011-01-29 VITALS — BP 118/76 | HR 76 | Temp 97.7°F | Ht 68.0 in | Wt 188.0 lb

## 2011-01-29 DIAGNOSIS — I10 Essential (primary) hypertension: Secondary | ICD-10-CM

## 2011-01-29 DIAGNOSIS — F419 Anxiety disorder, unspecified: Secondary | ICD-10-CM | POA: Insufficient documentation

## 2011-01-29 DIAGNOSIS — Z789 Other specified health status: Secondary | ICD-10-CM

## 2011-01-29 DIAGNOSIS — B20 Human immunodeficiency virus [HIV] disease: Secondary | ICD-10-CM

## 2011-01-29 DIAGNOSIS — E119 Type 2 diabetes mellitus without complications: Secondary | ICD-10-CM

## 2011-01-29 DIAGNOSIS — E039 Hypothyroidism, unspecified: Secondary | ICD-10-CM

## 2011-01-29 DIAGNOSIS — F411 Generalized anxiety disorder: Secondary | ICD-10-CM

## 2011-01-29 DIAGNOSIS — J984 Other disorders of lung: Secondary | ICD-10-CM

## 2011-01-29 MED ORDER — GEMFIBROZIL 600 MG PO TABS: 600.0000 mg | ORAL_TABLET | Freq: Two times a day (BID) | ORAL | Status: DC

## 2011-01-29 MED ORDER — CETIRIZINE HCL 10 MG PO TABS: 10.0000 mg | ORAL_TABLET | Freq: Every day | ORAL | Status: DC

## 2011-01-29 MED ORDER — METFORMIN HCL 1000 MG PO TABS
1000.0000 mg | ORAL_TABLET | Freq: Two times a day (BID) | ORAL | Status: DC
Start: 2011-01-29 — End: 2011-06-06

## 2011-01-29 MED ORDER — GLUCOSE BLOOD VI STRP: ORAL_STRIP | Status: AC

## 2011-01-29 MED ORDER — LEVOTHYROXINE SODIUM 25 MCG PO TABS: 25.0000 ug | ORAL_TABLET | Freq: Every day | ORAL | Status: DC

## 2011-01-29 MED ORDER — CVS LANCETS ORIGINAL MISC: 1.0000 "application " | Freq: Every day | Status: DC

## 2011-01-29 MED ORDER — ALPRAZOLAM 0.5 MG PO TABS: 0.5000 mg | ORAL_TABLET | Freq: Three times a day (TID) | ORAL | Status: DC | PRN

## 2011-01-29 NOTE — Progress Notes (Signed)
  Subjective:    Patient ID: Jonathan Estes, male    DOB: 1955/09/04, 55 y.o.   MRN: [id number]  HPI  Jonathan Estes is a 55 y.o. male who is doing superbly well on their antiviral regimen, with undetectable viral load and health cd4 count. He asks for is for diabetic supplies which we provided today. He is doing well. I am going to CT scan to followup his cavitary lung lesion. I spent greater than 45 minutes with the patient including greater than 50% of time in face to face counsel of the patient and in coordination of their care and in desscribing different ARV regimens   Review of Systems  Constitutional: Positive for diaphoresis. Negative for fever, chills, activity change, appetite change, fatigue and unexpected weight change.  HENT: Negative for congestion, sore throat, rhinorrhea, sneezing, trouble swallowing and sinus pressure.   Eyes: Negative for photophobia and visual disturbance.  Respiratory: Negative for cough, chest tightness, shortness of breath, wheezing and stridor.   Cardiovascular: Negative for chest pain, palpitations and leg swelling.  Gastrointestinal: Negative for nausea, vomiting, abdominal pain, diarrhea, constipation, blood in stool, abdominal distention and anal bleeding.  Genitourinary: Negative for dysuria, hematuria, flank pain and difficulty urinating.  Musculoskeletal: Negative for myalgias, back pain, joint swelling, arthralgias and gait problem.  Skin: Negative for color change, pallor, rash and wound.  Neurological: Negative for dizziness, tremors, weakness and light-headedness.  Hematological: Negative for adenopathy. Does not bruise/bleed easily.  Psychiatric/Behavioral: Negative for behavioral problems, confusion, sleep disturbance, dysphoric mood, decreased concentration and agitation.       Objective:   Physical Exam  Constitutional: He is oriented to person, place, and time. He appears well-developed and well-nourished. No distress.  HENT:    Head: Normocephalic and atraumatic.  Mouth/Throat: Oropharynx is clear and moist. No oropharyngeal exudate.  Eyes: Conjunctivae and EOM are normal. Pupils are equal, round, and reactive to light. No scleral icterus.  Neck: Normal range of motion. Neck supple. No JVD present.  Cardiovascular: Normal rate, regular rhythm and normal heart sounds.  Exam reveals no gallop and no friction rub.   No murmur heard. Pulmonary/Chest: Effort normal and breath sounds normal. No respiratory distress. He has no wheezes. He has no rales. He exhibits no tenderness.  Abdominal: He exhibits no distension and no mass. There is no tenderness. There is no rebound and no guarding.  Musculoskeletal: He exhibits no edema and no tenderness.  Lymphadenopathy:    He has no cervical adenopathy.  Neurological: He is alert and oriented to person, place, and time. He has normal reflexes. He exhibits normal muscle tone. Coordination normal.  Skin: Skin is warm and dry. He is not diaphoretic. No erythema. No pallor.  Psychiatric: He has a normal mood and affect. His behavior is normal. Judgment and thought content normal.          Assessment & Plan:  HIV INFECTION Continue isentress and truvada via ACTG 5257. Consdier other options once he finishes study. He would prefer once daily regimen. He does have elevated lipids and Diabetes  ESSENTIAL HYPERTENSION, BENIGN Reasonable control  HYPOTHYROIDISM, BORDERLINE Recheck tsh at next appt  DIABETES MELLITUS, TYPE II Refilled meds and glucometer. May want to have him plugged into another PCP so that he gets all the proper diabetic education etc. I will refer him for optho and foot exams  PULMONARY NODULE Repeat CT scan next month

## 2011-01-29 NOTE — Assessment & Plan Note (Signed)
Continue isentress and truvada via ACTG 5257. Consdier other options once he finishes study. He would prefer once daily regimen. He does have elevated lipids and Diabetes

## 2011-01-29 NOTE — Assessment & Plan Note (Signed)
Repeat CT scan next month

## 2011-01-29 NOTE — Assessment & Plan Note (Signed)
Recheck tsh at next appt

## 2011-01-29 NOTE — Assessment & Plan Note (Signed)
Reasonable control. 

## 2011-01-29 NOTE — Assessment & Plan Note (Signed)
Refilled meds and glucometer. May want to have him plugged into another PCP so that he gets all the proper diabetic education etc. I will refer him for optho and foot exams

## 2011-03-08 ENCOUNTER — Telehealth: Payer: Self-pay | Admitting: *Deleted

## 2011-03-08 NOTE — Telephone Encounter (Signed)
Patient called advised he got a letter in the mail from [employer] of prior approval for chest CT and wanted to know the date. Looked in the system and could not tell if scheduled so will check and have to call him back. He also said he needs a prior auth on his xanax. Advised him to place refill request and we will be contacted by pharmacy.

## 2011-03-19 ENCOUNTER — Ambulatory Visit (HOSPITAL_COMMUNITY)
Admission: RE | Admit: 2011-03-19 | Discharge: 2011-03-19 | Disposition: A | Discharge status: Home / Self Care | Payer: Medicare Other | Source: Ambulatory Visit | Attending: Infectious Disease | Admitting: Infectious Disease

## 2011-03-19 ENCOUNTER — Encounter (HOSPITAL_COMMUNITY): Payer: Self-pay

## 2011-03-19 ENCOUNTER — Ambulatory Visit (INDEPENDENT_AMBULATORY_CARE_PROVIDER_SITE_OTHER): Payer: Medicare Other | Admitting: *Deleted

## 2011-03-19 VITALS — BP 117/76 | HR 68 | Temp 98.1°F | Resp 16 | Wt 201.8 lb

## 2011-03-19 DIAGNOSIS — Z21 Asymptomatic human immunodeficiency virus [HIV] infection status: Secondary | ICD-10-CM | POA: Insufficient documentation

## 2011-03-19 DIAGNOSIS — B20 Human immunodeficiency virus [HIV] disease: Secondary | ICD-10-CM

## 2011-03-19 DIAGNOSIS — J984 Other disorders of lung: Secondary | ICD-10-CM | POA: Insufficient documentation

## 2011-03-19 HISTORY — DX: Human immunodeficiency virus (HIV) disease: B20

## 2011-03-19 HISTORY — DX: Pneumocystosis: B59

## 2011-03-19 LAB — COMPREHENSIVE METABOLIC PANEL
ALT: 10 U/L (ref 0–53)
BUN: 20 mg/dL (ref 6–23)
CO2: 23 mEq/L (ref 19–32)
Calcium: 9.6 mg/dL (ref 8.4–10.5)
Creat: 1.07 mg/dL (ref 0.50–1.35)
Total Bilirubin: 0.3 mg/dL (ref 0.3–1.2)

## 2011-03-19 LAB — LIPID PANEL
Cholesterol: 111 mg/dL (ref 0–200)
HDL: 31 mg/dL — ABNORMAL LOW (ref >39)
Total CHOL/HDL Ratio: 3.6 Ratio
VLDL: 17 mg/dL (ref 0–40)

## 2011-03-19 LAB — CD4/CD8 (T-HELPER/T-SUPPRESSOR CELL)
CD4: 451
CD8 % Suppressor T Cell: 56.7

## 2011-03-19 LAB — HIV-1 RNA QUANT-NO REFLEX-BLD: HIV-1 RNA Viral Load: <40

## 2011-03-19 MED ORDER — IOHEXOL 300 MG/ML  SOLN
75.0000 mL | Freq: Once | INTRAMUSCULAR | Status: AC | PRN
  Administered 2011-03-19: 75 mL via INTRAVENOUS

## 2011-03-19 NOTE — Progress Notes (Signed)
Pt here for research study A5257/A5001 week 176. Assessment is unchanged since last study visit. Pt denies any new symptoms or problems. Informed patient that both studies will be closing and that this visit will be the last time study medications will be given. We will see hime for one more visit with A5257 and 2 more visit for A5001. Pt has already begun to check on how much his ARV medications will be and will schedule an appointment with Kandice Robinsons for financial counseling. Pt also has appointment with Dr. Daiva Eves to discuss his ARV medications and see if he should stay with medications he currently takes or switch to a one-a-day regimen. Vital signs stable. Non-fasting labs were drawn; Study medications were dispensed; Pt received $20 gift card for visit. Next appointment scheduled for Monday, Jul 16, 2011 @ 9am. Tacey Heap RN

## 2011-03-26 ENCOUNTER — Telehealth: Payer: Self-pay | Admitting: Infectious Disease

## 2011-03-26 ENCOUNTER — Telehealth: Payer: Self-pay | Admitting: Licensed Clinical Social Worker

## 2011-03-26 DIAGNOSIS — I2510 Atherosclerotic heart disease of native coronary artery without angina pectoris: Secondary | ICD-10-CM

## 2011-03-26 NOTE — Telephone Encounter (Signed)
  Dr. Daiva Eves requested patient to be seen by a Cardiologist due to CT of his chest showing CAD. I scheduled an appointment with [facility] for February 7,2013 at 3:30pm with Dr. Antoine Poche. The patient is aware.

## 2011-03-26 NOTE — Telephone Encounter (Signed)
Jonathan Estes, Mr Jonathan Estes looks by CT scan to have some coronary artery disease. I would like him seen by cardiologist to consider cardiac cath

## 2011-03-30 ENCOUNTER — Encounter: Payer: Self-pay | Admitting: Infectious Disease

## 2011-04-12 ENCOUNTER — Encounter: Payer: Self-pay | Admitting: Cardiology

## 2011-04-12 ENCOUNTER — Ambulatory Visit (INDEPENDENT_AMBULATORY_CARE_PROVIDER_SITE_OTHER): Payer: Medicare Other | Admitting: Cardiology

## 2011-04-12 VITALS — BP 130/75 | HR 64 | Ht 67.0 in | Wt 197.0 lb

## 2011-04-12 DIAGNOSIS — R9389 Abnormal findings on diagnostic imaging of other specified body structures: Secondary | ICD-10-CM

## 2011-04-12 DIAGNOSIS — E785 Hyperlipidemia, unspecified: Secondary | ICD-10-CM

## 2011-04-12 DIAGNOSIS — I10 Essential (primary) hypertension: Secondary | ICD-10-CM

## 2011-04-12 NOTE — Progress Notes (Signed)
HPI The patient presents for evaluation of coronary calcium. This was identified recently on a chest CT to follow some chronic lung disease. This could not be quantified qualified further on this study.  He has had no prior cardiac history. He does have cardiovascular risk factors however. He is active filling his bicycle routinely without bringing on any symptoms. He denies any chest pressure, neck or arm the. He denies any palpitations, presyncope or syncope. He has had no PND or orthopnea.   Allergies  Allergen Reactions  . Penicillins   . Sulfamethoxazole W/Trimethoprim     REACTION: severe rash    Current Outpatient Prescriptions  Medication Sig Dispense Refill  . ALPRAZolam (XANAX) 0.5 MG tablet Take 1 tablet (0.5 mg total) by mouth 3 (three) times daily as needed for sleep or anxiety.  90 tablet  4  . cetirizine (ZYRTEC) 10 MG tablet Take 1 tablet (10 mg total) by mouth daily.  90 tablet  3  . [employer] Lancets Original MISC 1 application by Does not apply route daily.  90 each  4  . emtricitabine-tenofovir (TRUVADA) 200-300 MG per tablet Take 1 tablet by mouth daily.  90 tablet  3  . gemfibrozil (LOPID) 600 MG tablet Take 1 tablet (600 mg total) by mouth 2 (two) times daily.  180 tablet  3  . glucose blood ([facility] BLOOD GLUCOSE TEST STRIPS) test strip Use as instructed  100 each  12  . hydrochlorothiazide (,MICROZIDE/HYDRODIURIL,) 12.5 MG capsule Take 1 capsule (12.5 mg total) by mouth daily.  90 capsule  3  . levothyroxine (SYNTHROID, LEVOTHROID) 25 MCG tablet Take 1 tablet (25 mcg total) by mouth daily. On empty stomach  90 tablet  3  . lisinopril (PRINIVIL,ZESTRIL) 40 MG tablet Take 1 tablet (40 mg total) by mouth daily. You will need your blood work checked one week after going to the higher dose  90 tablet  3  . metFORMIN (GLUCOPHAGE) 1000 MG tablet Take 1 tablet (1,000 mg total) by mouth 2 (two) times daily. With breakfast and dinner  120 tablet  3  . raltegravir (ISENTRESS) 400 MG  tablet Take 1 tablet (400 mg total) by mouth 2 (two) times daily.  120 tablet  3    Past Medical History  Diagnosis Date  . Pneumocystis carinii pneumonia   . Human immunodeficiency virus (HIV) disease     No past surgical history on file.  No family history on file.  History   Social History  . Marital Status: Single    Spouse Name: N/A    Number of Children: N/A  . Years of Education: N/A   Occupational History  . Not on file.   Social History Main Topics  . Smoking status: Never Smoker   . Smokeless tobacco: Never Used  . Alcohol Use: 1.0 oz/week    2 drink(s) per week  . Drug Use: 5 per week    Special: Marijuana  . Sexually Active: Yes -- Male partner(s)   Other Topics Concern  . Not on file   Social History Narrative  . No narrative on file   ROS:  As stated in the HPI and negative for all other systems.  PHYSICAL EXAM BP 130/75  Pulse 64  Ht 5\' 7"  (1.702 m)  Wt 197 lb (89.359 kg)  BMI 30.85 kg/m2 GENERAL:  Well appearing HEENT:  Pupils equal round and reactive, fundi not visualized, oral mucosa unremarkable NECK:  No jugular venous distention, waveform within normal limits, carotid  upstroke brisk and symmetric, no bruits, no thyromegaly LYMPHATICS:  No cervical, inguinal adenopathy LUNGS:  Clear to auscultation bilaterally BACK:  No CVA tenderness CHEST:  Unremarkable HEART:  PMI not displaced or sustained,S1 and S2 within normal limits, no S3, no S4, no clicks, no rubs, no murmurs ABD:  Flat, positive bowel sounds normal in frequency in pitch, no bruits, no rebound, no guarding, no midline pulsatile mass, no hepatomegaly, no splenomegaly EXT:  2 plus pulses throughout, no edema, no cyanosis no clubbing SKIN:  No rashes no nodules NEURO:  Cranial nerves II through XII grossly intact, motor grossly intact throughout PSYCH:  Cognitively intact, oriented to person place and time  EKG:  Sinus rhythm, rate 64, axis within normal limits, intervals  within normal limits, no acute ST-T wave changes.   ASSESSMENT AND PLAN

## 2011-04-12 NOTE — Assessment & Plan Note (Signed)
The blood pressure is at target. No change in medications is indicated. We will continue with therapeutic lifestyle changes (TLC).  

## 2011-04-12 NOTE — Assessment & Plan Note (Signed)
The patient does have some coronary calcium with risk factors. However, he has no symptoms. I will bring the patient back for a POET (Plain Old Exercise Test). This will allow me to screen for obstructive coronary disease, risk stratify and very importantly provide a prescription for exercise to complement what he is already doing. If this is negative no further cardiovascular testing would be suggested.

## 2011-04-12 NOTE — Assessment & Plan Note (Signed)
His last lipid profile demonstrated an LDL of 63 and an HDL of 31. Triglycerides were in the 80s. This is an excellent result. No change in therapy is indicated.

## 2011-04-12 NOTE — Patient Instructions (Signed)
The current medical regimen is effective;  continue present plan and medications.  Your physician has requested that you have an exercise tolerance test. For further information please visit [email or web address]. Please also follow instruction sheet, as given.   

## 2011-05-10 ENCOUNTER — Telehealth: Payer: Self-pay | Admitting: Licensed Clinical Social Worker

## 2011-05-10 ENCOUNTER — Ambulatory Visit (INDEPENDENT_AMBULATORY_CARE_PROVIDER_SITE_OTHER): Payer: Medicare Other | Admitting: Cardiology

## 2011-05-10 ENCOUNTER — Other Ambulatory Visit: Payer: Self-pay | Admitting: Infectious Disease

## 2011-05-10 DIAGNOSIS — R0600 Dyspnea, unspecified: Secondary | ICD-10-CM | POA: Insufficient documentation

## 2011-05-10 DIAGNOSIS — I10 Essential (primary) hypertension: Secondary | ICD-10-CM

## 2011-05-10 NOTE — Telephone Encounter (Signed)
Patient called stating that he wants to wait until May to discuss going to a Pulmonologist for a PFT's. He is concerned about the medical bills that he is accumulating. He said if Dr. Daiva Eves thought it was urgent that he went now he would go.

## 2011-05-10 NOTE — Progress Notes (Signed)
Exercise Treadmill Test  Pre-Exercise Testing Evaluation Rhythm: normal sinus  Rate: 74   PR:  .15 QRS:  .08  QT:  .36 QTc: .40     Test  Exercise Tolerance Test Ordering MD: Angelina Sheriff, MD  Interpreting MD:  Angelina Sheriff, MD  Unique Test No: 1  Treadmill:  1  Indication for ETT: HTN  Contraindication to ETT: No   Stress Modality: exercise - treadmill  Cardiac Imaging Performed: non   Protocol: standard Bruce - maximal  Max BP:  173/77  Max MPHR (bpm):  167 85% MPR (bpm):  142  MPHR obtained (bpm):  153 % MPHR obtained:  91  Reached 85% MPHR (min:sec):  3:50 Total Exercise Time (min-sec):  5:00  Workload in METS:  7.0 Borg Scale: 17  Reason ETT Terminated:  dyspnea    ST Segment Analysis At Rest: normal ST segments - no evidence of significant ST depression With Exercise: no evidence of significant ST depression  Other Information Arrhythmia:  No Angina during ETT:  absent (0) Quality of ETT:  diagnostic  ETT Interpretation:  normal - no evidence of ischemia by ST analysis  Comments: The patient had a very poor exercise tolerance.  There was no chest pain.  There was an increased level of dyspnea.  There were no arrhythmias normal BP response.  However, he had an abnormal HR recovery. There were no ischemic ST T wave changes.  He was very dyspneic with an O2 sat was 93% at peak exercise.     Recommendations: Negative adequate ETT.  There is no evidence that he has obstructive CAD.  He clearly has coronary calcium and therefore some plaque.  He needs aggressive risk reduction and to follow with his primary MD for follow up of his chronic lung disease and dyspnea.

## 2011-06-06 ENCOUNTER — Other Ambulatory Visit: Payer: Self-pay | Admitting: Licensed Clinical Social Worker

## 2011-06-06 DIAGNOSIS — E119 Type 2 diabetes mellitus without complications: Secondary | ICD-10-CM

## 2011-06-06 MED ORDER — METFORMIN HCL 1000 MG PO TABS: 1000.0000 mg | ORAL_TABLET | Freq: Two times a day (BID) | ORAL | Status: DC

## 2011-07-16 ENCOUNTER — Ambulatory Visit (INDEPENDENT_AMBULATORY_CARE_PROVIDER_SITE_OTHER): Payer: Medicare Other | Admitting: Infectious Disease

## 2011-07-16 ENCOUNTER — Ambulatory Visit (INDEPENDENT_AMBULATORY_CARE_PROVIDER_SITE_OTHER): Payer: Medicare Other | Admitting: *Deleted

## 2011-07-16 ENCOUNTER — Other Ambulatory Visit: Payer: Self-pay | Admitting: *Deleted

## 2011-07-16 ENCOUNTER — Encounter: Payer: Self-pay | Admitting: *Deleted

## 2011-07-16 VITALS — BP 118/78 | HR 60 | Temp 97.8°F | Resp 16 | Ht 70.0 in | Wt 193.5 lb

## 2011-07-16 DIAGNOSIS — E785 Hyperlipidemia, unspecified: Secondary | ICD-10-CM

## 2011-07-16 DIAGNOSIS — B20 Human immunodeficiency virus [HIV] disease: Secondary | ICD-10-CM

## 2011-07-16 DIAGNOSIS — F419 Anxiety disorder, unspecified: Secondary | ICD-10-CM

## 2011-07-16 DIAGNOSIS — I2510 Atherosclerotic heart disease of native coronary artery without angina pectoris: Secondary | ICD-10-CM

## 2011-07-16 DIAGNOSIS — E119 Type 2 diabetes mellitus without complications: Secondary | ICD-10-CM

## 2011-07-16 DIAGNOSIS — E039 Hypothyroidism, unspecified: Secondary | ICD-10-CM

## 2011-07-16 DIAGNOSIS — R0989 Other specified symptoms and signs involving the circulatory and respiratory systems: Secondary | ICD-10-CM

## 2011-07-16 DIAGNOSIS — E139 Other specified diabetes mellitus without complications: Secondary | ICD-10-CM

## 2011-07-16 DIAGNOSIS — F411 Generalized anxiety disorder: Secondary | ICD-10-CM

## 2011-07-16 DIAGNOSIS — I10 Essential (primary) hypertension: Secondary | ICD-10-CM

## 2011-07-16 DIAGNOSIS — R9389 Abnormal findings on diagnostic imaging of other specified body structures: Secondary | ICD-10-CM

## 2011-07-16 DIAGNOSIS — R0600 Dyspnea, unspecified: Secondary | ICD-10-CM

## 2011-07-16 DIAGNOSIS — F341 Dysthymic disorder: Secondary | ICD-10-CM

## 2011-07-16 LAB — HEPATIC FUNCTION PANEL
Alkaline Phosphatase: 91 U/L (ref 39–117)
Indirect Bilirubin: 0.3 mg/dL (ref 0.0–0.9)
Total Bilirubin: 0.4 mg/dL (ref 0.3–1.2)

## 2011-07-16 LAB — PROTEIN, URINE, RANDOM: Total Protein, Urine: 16 mg/dL

## 2011-07-16 LAB — HEPATITIS C ANTIBODY: HCV Ab: NEGATIVE

## 2011-07-16 LAB — POCT URINALYSIS DIPSTICK
Bilirubin, UA: NEGATIVE
Glucose, UA: NEGATIVE
Leukocytes, UA: NEGATIVE
Nitrite, UA: NEGATIVE
Urobilinogen, UA: 0.2

## 2011-07-16 LAB — CD4/CD8 (T-HELPER/T-SUPPRESSOR CELL)
CD4: 456
CD8 % Suppressor T Cell: 52.5
CD8: 1050

## 2011-07-16 LAB — LIPID PANEL
LDL Cholesterol: 93 mg/dL (ref 0–99)
VLDL: 23 mg/dL (ref 0–40)

## 2011-07-16 LAB — BASIC METABOLIC PANEL
BUN: 24 mg/dL — ABNORMAL HIGH (ref 6–23)
CO2: 25 mEq/L (ref 19–32)
Calcium: 9.7 mg/dL (ref 8.4–10.5)
Creat: 1.18 mg/dL (ref 0.50–1.35)
Glucose, Bld: 98 mg/dL (ref 70–99)

## 2011-07-16 LAB — HIV-1 RNA QUANT-NO REFLEX-BLD: HIV-1 RNA Viral Load: <40

## 2011-07-16 LAB — HEPATITIS B SURFACE ANTIGEN: Hepatitis B Surface Ag: NEGATIVE

## 2011-07-16 MED ORDER — EMTRICITABINE-TENOFOVIR DF 200-300 MG PO TABS: 1.0000 | ORAL_TABLET | Freq: Every day | ORAL | Status: DC

## 2011-07-16 MED ORDER — RALTEGRAVIR POTASSIUM 400 MG PO TABS: 400.0000 mg | ORAL_TABLET | Freq: Two times a day (BID) | ORAL | Status: DC

## 2011-07-16 MED ORDER — ALPRAZOLAM 0.5 MG PO TABS: 0.5000 mg | ORAL_TABLET | Freq: Three times a day (TID) | ORAL | Status: DC | PRN

## 2011-07-16 MED ORDER — HYDROCHLOROTHIAZIDE 12.5 MG PO CAPS: 12.5000 mg | ORAL_CAPSULE | Freq: Every day | ORAL | Status: DC

## 2011-07-16 MED ORDER — LEVOTHYROXINE SODIUM 25 MCG PO TABS: 25.0000 ug | ORAL_TABLET | Freq: Every day | ORAL | Status: DC

## 2011-07-16 MED ORDER — LISINOPRIL 40 MG PO TABS: 40.0000 mg | ORAL_TABLET | Freq: Every day | ORAL | Status: DC

## 2011-07-16 NOTE — Assessment & Plan Note (Signed)
Has plaques , seen by Dr. Antoine Poche. Medical management recommended and lipids at goal. rx dM, aspirin and optimize bp

## 2011-07-16 NOTE — Assessment & Plan Note (Signed)
Has scarring from prior PCP

## 2011-07-16 NOTE — Assessment & Plan Note (Signed)
Stable on chronic benzodiazepine

## 2011-07-16 NOTE — Assessment & Plan Note (Signed)
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 Get formal pfts

## 2011-07-16 NOTE — Assessment & Plan Note (Signed)
Check repeat TSH at next blood work

## 2011-07-16 NOTE — Progress Notes (Signed)
Patient was seen for his final A5257 study appointment. He has one more visit for A5001 and he is eligible for A5322 (HAILO) .  He c/o occassional loose stools and muscle aches. He has been trying to exercise and says it has been hard for him. He is also seeing Dr. Daiva Eves today and will get new scrips for his ARVs.

## 2011-07-16 NOTE — Assessment & Plan Note (Signed)
Lipids are AT goal

## 2011-07-16 NOTE — Assessment & Plan Note (Signed)
Continue isentress and truvada perfect ARV for him

## 2011-07-16 NOTE — Telephone Encounter (Signed)
He stated his co-pay for the truvada & isentress is almost $200 each. He stated he could not afford this. I called him & he states he is coming to see out financial counsellor Wednesday am. Randolm Idol is looking for a source of free meds. He has medicare. I will see him Wednesday if needed

## 2011-07-16 NOTE — Progress Notes (Signed)
Addended by: Aris Lot C on: 07/16/2011 11:26 AM   Modules accepted: Orders

## 2011-07-16 NOTE — Assessment & Plan Note (Signed)
bp with reasonable control

## 2011-07-16 NOTE — Progress Notes (Signed)
Subjective:    Patient ID: Jonathan Estes, male    DOB: [birth year], 56 y.o.   MRN: [id number]  HPI  Jonathan Estes is a 56 y.o. male who is doing superbly well on their antiviral regimen of isentress and truvada, with undetectable viral load and health cd4 count. He also does have hypothyroidism and diabetes mellitus on metformin. He had lung nodule and on repeat CT scan this appears c/w scarring. The CT scan did show plaque in coronaries and he was kindly seen by Dr> Hochrein with [facility] who performed Exercise Stress test where pt showed poor exercise recovery suspicious for pulmonary problem. He had recommended medical management with re to the pts CAD seen on CT scan. I am trying to schedule PFTs to further assess the pts pulmonary problem which may be partly due to damage from prior PCP. He has several forms that I filled out for him including forms for the [facility], for Division of Rehab and from insurance. I spent greater than 45 minutes with the patient including greater than 50% of time in face to face counsel of the patient and in coordination of their care.    Review of Systems  Constitutional: Negative for fever, chills, diaphoresis, activity change, appetite change, fatigue and unexpected weight change.  HENT: Negative for congestion, sore throat, rhinorrhea, sneezing, trouble swallowing and sinus pressure.   Eyes: Negative for photophobia and visual disturbance.  Respiratory: Positive for shortness of breath. Negative for cough, chest tightness, wheezing and stridor.   Cardiovascular: Negative for chest pain, palpitations and leg swelling.  Gastrointestinal: Negative for nausea, vomiting, abdominal pain, diarrhea, constipation, blood in stool, abdominal distention and anal bleeding.  Genitourinary: Negative for dysuria, hematuria, flank pain and difficulty urinating.  Musculoskeletal: Negative for myalgias, back pain, joint swelling, arthralgias and gait problem.  Skin: Negative  for color change, pallor, rash and wound.  Neurological: Negative for dizziness, tremors, weakness and light-headedness.  Hematological: Negative for adenopathy. Does not bruise/bleed easily.  Psychiatric/Behavioral: Negative for behavioral problems, confusion, sleep disturbance, dysphoric mood, decreased concentration and agitation.       Objective:   Physical Exam  Constitutional: He is oriented to person, place, and time. He appears well-developed and well-nourished. No distress.  HENT:  Head: Normocephalic and atraumatic.  Mouth/Throat: Oropharynx is clear and moist. No oropharyngeal exudate.  Eyes: Conjunctivae and EOM are normal. Pupils are equal, round, and reactive to light. No scleral icterus.       Visual acuity bilateral is 20/20  Right eye is 20/25 Left eye was 20/30 as checked by RN  Neck: Normal range of motion. Neck supple. No JVD present.  Cardiovascular: Normal rate, regular rhythm and normal heart sounds.  Exam reveals no gallop and no friction rub.   No murmur heard. Pulmonary/Chest: Effort normal and breath sounds normal. No respiratory distress. He has no wheezes. He has no rales. He exhibits no tenderness.  Abdominal: He exhibits no distension and no mass. There is no tenderness. There is no rebound and no guarding.  Musculoskeletal: He exhibits no edema and no tenderness.  Lymphadenopathy:    He has no cervical adenopathy.  Neurological: He is alert and oriented to person, place, and time. He has normal reflexes. He exhibits normal muscle tone. Coordination normal.  Skin: Skin is warm and dry. He is not diaphoretic. No erythema. No pallor.  Psychiatric: He has a normal mood and affect. His behavior is normal. Judgment and thought content normal.  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 Get formal pfts  CAD (coronary artery disease) Has plaques , seen by Dr. Antoine Poche. Medical management recommended and lipids at goal. rx dM, aspirin and optimize bp

## 2011-07-16 NOTE — Assessment & Plan Note (Addendum)
a1c was 5.4 when last checked, checking again today, needs to see optho this year again

## 2011-07-17 LAB — HEMOGLOBIN A1C: Hgb A1c MFr Bld: 5.5 % (ref <5.7)

## 2011-07-18 ENCOUNTER — Ambulatory Visit: Payer: Medicare Other

## 2011-07-18 ENCOUNTER — Other Ambulatory Visit: Payer: Self-pay | Admitting: Licensed Clinical Social Worker

## 2011-07-18 ENCOUNTER — Other Ambulatory Visit: Payer: Self-pay | Admitting: *Deleted

## 2011-07-18 DIAGNOSIS — B20 Human immunodeficiency virus [HIV] disease: Secondary | ICD-10-CM

## 2011-07-18 MED ORDER — RALTEGRAVIR POTASSIUM 400 MG PO TABS: 400.0000 mg | ORAL_TABLET | Freq: Two times a day (BID) | ORAL | Status: DC

## 2011-07-18 MED ORDER — EMTRICITABINE-TENOFOVIR DF 200-300 MG PO TABS: 1.0000 | ORAL_TABLET | Freq: Every day | ORAL | Status: DC

## 2011-07-23 ENCOUNTER — Telehealth: Payer: Self-pay | Admitting: *Deleted

## 2011-07-23 NOTE — Telephone Encounter (Signed)
Pt wanted to know if Dr. Daiva Eves intended to only make the form "good" for one year.  Dr. Clinton Gallant letter of support read differently.  RN spoke with Dr. Daiva Eves.  MD wants the pt to go for another year without any traffic/driving problems in order to "sign off" on continued driving without restrictions.  Phone call to the patient.  Explained Dr. Zenaida Niece Dam's reasoning for the one year approval.  Pt voiced understanding.

## 2011-08-01 ENCOUNTER — Other Ambulatory Visit: Payer: Self-pay | Admitting: *Deleted

## 2011-08-01 DIAGNOSIS — E119 Type 2 diabetes mellitus without complications: Secondary | ICD-10-CM

## 2011-08-01 DIAGNOSIS — I10 Essential (primary) hypertension: Secondary | ICD-10-CM

## 2011-08-01 DIAGNOSIS — B20 Human immunodeficiency virus [HIV] disease: Secondary | ICD-10-CM

## 2011-08-01 MED ORDER — RALTEGRAVIR POTASSIUM 400 MG PO TABS: 400.0000 mg | ORAL_TABLET | Freq: Two times a day (BID) | ORAL | Status: DC

## 2011-08-01 MED ORDER — METFORMIN HCL 1000 MG PO TABS: 1000.0000 mg | ORAL_TABLET | Freq: Two times a day (BID) | ORAL | Status: DC

## 2011-08-01 MED ORDER — GEMFIBROZIL 600 MG PO TABS: 600.0000 mg | ORAL_TABLET | Freq: Two times a day (BID) | ORAL | Status: DC

## 2011-08-01 MED ORDER — EMTRICITABINE-TENOFOVIR DF 200-300 MG PO TABS: 1.0000 | ORAL_TABLET | Freq: Every day | ORAL | Status: DC

## 2011-08-01 MED ORDER — LISINOPRIL 40 MG PO TABS: 40.0000 mg | ORAL_TABLET | Freq: Every day | ORAL | Status: DC

## 2011-08-01 NOTE — Telephone Encounter (Signed)
Pt received [facility] approval.  Refills sent to [facility], [place], [place], [place].

## 2011-08-02 ENCOUNTER — Other Ambulatory Visit: Payer: Self-pay | Admitting: *Deleted

## 2011-08-02 ENCOUNTER — Telehealth: Payer: Self-pay | Admitting: Licensed Clinical Social Worker

## 2011-08-02 DIAGNOSIS — E119 Type 2 diabetes mellitus without complications: Secondary | ICD-10-CM

## 2011-08-02 MED ORDER — METFORMIN HCL 1000 MG PO TABS: 1000.0000 mg | ORAL_TABLET | Freq: Two times a day (BID) | ORAL | Status: DC

## 2011-08-02 NOTE — Telephone Encounter (Signed)
Patient called stating that all he needed called in to [facility] was truvada and Isentress and the other medications that he takes [facility] [place]. He does not use [facility] anymore.

## 2011-08-02 NOTE — Telephone Encounter (Signed)
Patient called and needed this Rx to go to the [facility] in [place] not to [facility].

## 2011-08-03 ENCOUNTER — Other Ambulatory Visit: Payer: Self-pay | Admitting: Infectious Disease

## 2011-08-03 DIAGNOSIS — E119 Type 2 diabetes mellitus without complications: Secondary | ICD-10-CM

## 2011-08-10 ENCOUNTER — Encounter: Payer: Self-pay | Admitting: Infectious Disease

## 2011-08-31 ENCOUNTER — Other Ambulatory Visit: Payer: Self-pay | Admitting: *Deleted

## 2011-08-31 DIAGNOSIS — I10 Essential (primary) hypertension: Secondary | ICD-10-CM

## 2011-08-31 MED ORDER — LISINOPRIL 40 MG PO TABS: 40.0000 mg | ORAL_TABLET | Freq: Every day | ORAL | Status: DC

## 2011-09-03 ENCOUNTER — Other Ambulatory Visit: Payer: Self-pay | Admitting: *Deleted

## 2011-09-03 DIAGNOSIS — F419 Anxiety disorder, unspecified: Secondary | ICD-10-CM

## 2011-09-03 DIAGNOSIS — I10 Essential (primary) hypertension: Secondary | ICD-10-CM

## 2011-09-03 DIAGNOSIS — E119 Type 2 diabetes mellitus without complications: Secondary | ICD-10-CM

## 2011-09-03 DIAGNOSIS — E785 Hyperlipidemia, unspecified: Secondary | ICD-10-CM

## 2011-09-03 MED ORDER — GEMFIBROZIL 600 MG PO TABS: 600.0000 mg | ORAL_TABLET | Freq: Two times a day (BID) | ORAL | Status: DC

## 2011-09-03 MED ORDER — LISINOPRIL 40 MG PO TABS: 40.0000 mg | ORAL_TABLET | Freq: Every day | ORAL | Status: DC

## 2011-09-03 NOTE — Telephone Encounter (Signed)
Pt upset about his medications.  He uses 3 different pharmacies.  Some of the medications he obtains a 90-day rx, some a 30-day rx and then, his HIV rxes go to [facility].  RN was able to straighten out the [facility] [place] and the ones that go to [facility] in [place].  Refills done for those that needed them.

## 2011-09-18 ENCOUNTER — Encounter: Payer: Self-pay | Admitting: *Deleted

## 2011-09-18 ENCOUNTER — Ambulatory Visit: Payer: Medicare Other

## 2011-09-18 ENCOUNTER — Ambulatory Visit (INDEPENDENT_AMBULATORY_CARE_PROVIDER_SITE_OTHER): Payer: Medicare Other | Admitting: *Deleted

## 2011-09-18 VITALS — BP 113/56 | HR 62 | Temp 98.1°F | Resp 16 | Wt 193.8 lb

## 2011-09-18 DIAGNOSIS — B20 Human immunodeficiency virus [HIV] disease: Secondary | ICD-10-CM

## 2011-09-18 LAB — LIPID PANEL
HDL: 45 mg/dL (ref >39)
LDL Cholesterol: 77 mg/dL (ref 0–99)
Total CHOL/HDL Ratio: 3.1 Ratio

## 2011-09-18 LAB — COMPREHENSIVE METABOLIC PANEL
ALT: 9 U/L (ref 0–53)
AST: 15 U/L (ref 0–37)
Alkaline Phosphatase: 80 U/L (ref 39–117)
BUN: 20 mg/dL (ref 6–23)
Calcium: 10 mg/dL (ref 8.4–10.5)
Chloride: 105 mEq/L (ref 96–112)
Creat: 1.06 mg/dL (ref 0.50–1.35)
Potassium: 4.2 mEq/L (ref 3.5–5.3)

## 2011-09-18 NOTE — Progress Notes (Signed)
Pt here for Final study visit A5001, week 208. Assessment unchanged since last study visit. Fasting labs were drawn and vital signs remain stable. Pt received $20.00 gift card for visit. Pt plans on enrolling in [phone] when it becomes available. Tacey Heap RN

## 2011-09-19 LAB — PROTEIN, URINE, RANDOM: Total Protein, Urine: 8 mg/dL

## 2011-09-19 LAB — CREATININE, URINE, RANDOM: Creatinine, Urine: 55.2 mg/dL

## 2011-10-22 ENCOUNTER — Telehealth: Payer: Self-pay | Admitting: *Deleted

## 2011-10-22 NOTE — Telephone Encounter (Signed)
Pt VERY upset about MD sharing substance abuse information on his Driver's License application.  The pt has had to pay for substance abuse counseling ($65.00) and multiple trip to the [facility].  Was using profanity during the phone call.  He was asked to control this language and he did try and apologized.  The patient does want to speak with Dr. Daiva Eves as soon as possible.  Pt was told that Dr. Daiva Eves was not at [facility] today but was in the hospital seeing patients.  Please call the pt.

## 2011-10-22 NOTE — Addendum Note (Signed)
Addended by: Phill Myron on: 10/22/2011 04:50 PM   Modules accepted: Orders

## 2011-10-23 NOTE — Telephone Encounter (Signed)
I called Jonathan Estes re the driving forms, I explained that I assume that state must've obtained his records to obtain the records substance abuse information re beer and thc use. He felt better after talking to me and feels better

## 2011-10-24 NOTE — Telephone Encounter (Signed)
Thank you :)

## 2011-11-03 ENCOUNTER — Other Ambulatory Visit: Payer: Self-pay | Admitting: Infectious Disease

## 2011-11-12 ENCOUNTER — Telehealth: Payer: Self-pay | Admitting: *Deleted

## 2011-11-12 NOTE — Telephone Encounter (Signed)
Jonathan Estes, can we see if a THP case manager or someone can help him and also help elucidate what is going on

## 2011-11-12 NOTE — Telephone Encounter (Signed)
Patient called extremely upset because he got a letter from [facility] that needs to be filled out and mailed back.  I asked if his license was under restriction or suspension and he said no.  He still was very upset and wanted Dr. Daiva Eves to call the [facility]  I told him I did not understand what the problem was if he still has his license.  Suggested he bring the form in at his next appt. Jonathan Estes CMA

## 2011-11-13 NOTE — Telephone Encounter (Signed)
Patient called back and he spoke with someone at the [facility] and he said everything has been worked out now. Wendall Mola CMA

## 2011-11-13 NOTE — Telephone Encounter (Signed)
good

## 2011-12-17 ENCOUNTER — Encounter: Payer: Self-pay | Admitting: Infectious Disease

## 2011-12-17 ENCOUNTER — Ambulatory Visit (INDEPENDENT_AMBULATORY_CARE_PROVIDER_SITE_OTHER): Payer: Medicare Other | Admitting: Infectious Disease

## 2011-12-17 VITALS — BP 111/72 | HR 58 | Temp 97.4°F | Ht 70.0 in | Wt 195.0 lb

## 2011-12-17 DIAGNOSIS — Z23 Encounter for immunization: Secondary | ICD-10-CM

## 2011-12-17 DIAGNOSIS — E785 Hyperlipidemia, unspecified: Secondary | ICD-10-CM

## 2011-12-17 DIAGNOSIS — E039 Hypothyroidism, unspecified: Secondary | ICD-10-CM

## 2011-12-17 DIAGNOSIS — I10 Essential (primary) hypertension: Secondary | ICD-10-CM

## 2011-12-17 DIAGNOSIS — E119 Type 2 diabetes mellitus without complications: Secondary | ICD-10-CM

## 2011-12-17 DIAGNOSIS — F419 Anxiety disorder, unspecified: Secondary | ICD-10-CM

## 2011-12-17 DIAGNOSIS — F411 Generalized anxiety disorder: Secondary | ICD-10-CM

## 2011-12-17 DIAGNOSIS — B20 Human immunodeficiency virus [HIV] disease: Secondary | ICD-10-CM

## 2011-12-17 LAB — CBC WITH DIFFERENTIAL/PLATELET
Basophils Absolute: 0.1 10*3/uL (ref 0.0–0.1)
Eosinophils Relative: 4 % (ref 0–5)
Lymphocytes Relative: 28 % (ref 12–46)
MCV: 90.2 fL (ref 78.0–100.0)
Neutro Abs: 4.9 10*3/uL (ref 1.7–7.7)
Platelets: 288 10*3/uL (ref 150–400)
RDW: 12.3 % (ref 11.5–15.5)
WBC: 8.2 10*3/uL (ref 4.0–10.5)

## 2011-12-17 LAB — LIPID PANEL
HDL: 39 mg/dL — ABNORMAL LOW (ref >39)
LDL Cholesterol: 73 mg/dL (ref 0–99)
Total CHOL/HDL Ratio: 3.6 Ratio

## 2011-12-17 LAB — HEMOGLOBIN A1C: Mean Plasma Glucose: 111 mg/dL (ref <117)

## 2011-12-17 MED ORDER — ALPRAZOLAM 0.5 MG PO TABS: 0.5000 mg | ORAL_TABLET | Freq: Three times a day (TID) | ORAL | Status: DC | PRN

## 2011-12-17 NOTE — Progress Notes (Signed)
  Subjective:    Patient ID: Jonathan Estes, male    DOB: [birth year], 56 y.o.   MRN: [id number]  HPI  Jonathan Estes is a 56 y.o. male who is doing superbly well on their antiviral regimen of isentress and truvada, with undetectable viral load and health cd4 count. He also does have hypothyroidism and diabetes mellitus on metformin--though last a1c was normal. He had ? CAD but stress test normal and there was concern for possible pulmonary disease, still needing records of pFTs. He is doing very well today and without any specific complaints. Has seen optho in past year. I spent greater than 45 minutes with the patient including greater than 50% of time in face to face counsel of the patient and in coordination of their care.    Review of Systems  Constitutional: Negative for fever, chills, diaphoresis, activity change, appetite change, fatigue and unexpected weight change.  HENT: Negative for congestion, sore throat, rhinorrhea, sneezing, trouble swallowing and sinus pressure.   Eyes: Negative for photophobia and visual disturbance.  Respiratory: Negative for cough, chest tightness, shortness of breath, wheezing and stridor.   Cardiovascular: Negative for chest pain, palpitations and leg swelling.  Gastrointestinal: Negative for nausea, vomiting, abdominal pain, diarrhea, constipation, blood in stool, abdominal distention and anal bleeding.  Genitourinary: Negative for dysuria, hematuria, flank pain and difficulty urinating.  Musculoskeletal: Negative for myalgias, back pain, joint swelling, arthralgias and gait problem.  Skin: Negative for color change, pallor, rash and wound.  Neurological: Negative for dizziness, tremors, weakness and light-headedness.  Hematological: Negative for adenopathy. Does not bruise/bleed easily.  Psychiatric/Behavioral: Negative for behavioral problems, confusion, disturbed wake/sleep cycle, dysphoric mood, decreased concentration and agitation.         Objective:   Physical Exam  Constitutional: He is oriented to person, place, and time. He appears well-developed and well-nourished. No distress.  HENT:  Head: Normocephalic and atraumatic.  Mouth/Throat: Oropharynx is clear and moist. No oropharyngeal exudate.  Eyes: Conjunctivae normal and EOM are normal. Pupils are equal, round, and reactive to light. No scleral icterus.  Neck: Normal range of motion. Neck supple. No JVD present.  Cardiovascular: Normal rate, regular rhythm and normal heart sounds.  Exam reveals no gallop and no friction rub.   No murmur heard. Pulmonary/Chest: Effort normal and breath sounds normal. No respiratory distress. He has no wheezes. He has no rales. He exhibits no tenderness.  Abdominal: He exhibits no distension and no mass. There is no tenderness. There is no rebound and no guarding.  Musculoskeletal: He exhibits no edema and no tenderness.  Lymphadenopathy:    He has no cervical adenopathy.  Neurological: He is alert and oriented to person, place, and time. He has normal reflexes. He exhibits normal muscle tone. Coordination normal.  Skin: Skin is warm and dry. He is not diaphoretic. No erythema. No pallor.  Psychiatric: He has a normal mood and affect. His behavior is normal. Judgment and thought content normal.          Assessment & Plan:  HIV INFECTION Perfect control, continue isentress and truvada  HYPOTHYROIDISM, BORDERLINE Need to recheck tsh  HYPERLIPIDEMIA On a fibrate  ESSENTIAL HYPERTENSION, BENIGN Well controlled  Anxiety Xanax refilled

## 2011-12-17 NOTE — Assessment & Plan Note (Signed)
Xanax refilled.  

## 2011-12-17 NOTE — Assessment & Plan Note (Signed)
Need to recheck tsh

## 2011-12-17 NOTE — Assessment & Plan Note (Signed)
Well controlled 

## 2011-12-17 NOTE — Assessment & Plan Note (Signed)
Perfect control, continue isentress and truvada

## 2011-12-17 NOTE — Assessment & Plan Note (Signed)
On a fibrate

## 2011-12-18 LAB — COMPLETE METABOLIC PANEL WITH GFR
ALT: 11 U/L (ref 0–53)
AST: 16 U/L (ref 0–37)
Alkaline Phosphatase: 75 U/L (ref 39–117)
Calcium: 9.8 mg/dL (ref 8.4–10.5)
Chloride: 104 mEq/L (ref 96–112)
Creat: 1.02 mg/dL (ref 0.50–1.35)

## 2011-12-18 LAB — HIV-1 RNA QUANT-NO REFLEX-BLD
HIV 1 RNA Quant: 26 copies/mL — ABNORMAL HIGH (ref <20)
HIV-1 RNA Quant, Log: 1.41 {Log} — ABNORMAL HIGH (ref <1.30)

## 2011-12-18 LAB — T-HELPER CELL (CD4) - (RCID CLINIC ONLY)
CD4 % Helper T Cell: 23 % — ABNORMAL LOW (ref 33–55)
CD4 T Cell Abs: 550 uL (ref 400–2700)

## 2011-12-26 ENCOUNTER — Telehealth: Payer: Self-pay | Admitting: *Deleted

## 2011-12-26 ENCOUNTER — Other Ambulatory Visit: Payer: Self-pay | Admitting: *Deleted

## 2011-12-26 DIAGNOSIS — I10 Essential (primary) hypertension: Secondary | ICD-10-CM

## 2011-12-26 MED ORDER — LISINOPRIL 40 MG PO TABS: 40.0000 mg | ORAL_TABLET | Freq: Every day | ORAL | Status: DC

## 2011-12-26 NOTE — Telephone Encounter (Signed)
THey are all reading at 100? He may need to go back up on metformin. Maybe 1gram in the am and 500mg  at night

## 2011-12-26 NOTE — Telephone Encounter (Signed)
Patient has lowered his Metformin to 500 mg bid and his glucose readings have been 105, just wanted Dr. Daiva Eves to know. Jonathan Estes

## 2011-12-27 ENCOUNTER — Telehealth: Payer: Self-pay | Admitting: Licensed Clinical Social Worker

## 2011-12-27 NOTE — Telephone Encounter (Signed)
Patient states his glucose is  running 103 to 104 in the morning and 90 around lunch, and around 105 to 110 at night with taking 500 metformin am and pm. He prefers to stay on 500 mg am and pm.

## 2011-12-31 NOTE — Telephone Encounter (Signed)
That is a fairly high morning glucose. I think he would do better to go up slightly.Marland KitchenMarland KitchenAGAIN ANother reason to get him plugged in to primary care and diabetic educator etc

## 2012-01-01 NOTE — Telephone Encounter (Signed)
Very Good

## 2012-01-01 NOTE — Telephone Encounter (Signed)
I called the patient and he will take 500 mg and take 1000 mg at night.

## 2012-01-03 ENCOUNTER — Other Ambulatory Visit: Payer: Self-pay | Admitting: Infectious Disease

## 2012-02-01 ENCOUNTER — Other Ambulatory Visit: Payer: Self-pay | Admitting: Infectious Disease

## 2012-03-07 ENCOUNTER — Other Ambulatory Visit: Payer: Self-pay | Admitting: *Deleted

## 2012-03-18 ENCOUNTER — Other Ambulatory Visit: Payer: Self-pay | Admitting: Infectious Disease

## 2012-03-18 ENCOUNTER — Encounter: Payer: Self-pay | Admitting: *Deleted

## 2012-03-18 ENCOUNTER — Ambulatory Visit (INDEPENDENT_AMBULATORY_CARE_PROVIDER_SITE_OTHER): Payer: Medicare Other | Admitting: *Deleted

## 2012-03-18 VITALS — BP 126/83 | HR 61 | Temp 97.5°F | Ht 68.75 in | Wt 193.0 lb

## 2012-03-18 DIAGNOSIS — B20 Human immunodeficiency virus [HIV] disease: Secondary | ICD-10-CM

## 2012-03-18 LAB — COMPLETE METABOLIC PANEL WITH GFR
ALT: 10 U/L (ref 0–53)
Albumin: 4.7 g/dL (ref 3.5–5.2)
CO2: 24 mEq/L (ref 19–32)
Calcium: 9.8 mg/dL (ref 8.4–10.5)
Chloride: 103 mEq/L (ref 96–112)
GFR, Est African American: >89 mL/min
GFR, Est Non African American: >89 mL/min
Glucose, Bld: 90 mg/dL (ref 70–99)
Sodium: 137 mEq/L (ref 135–145)
Total Bilirubin: 0.4 mg/dL (ref 0.3–1.2)
Total Protein: 7.9 g/dL (ref 6.0–8.3)

## 2012-03-18 LAB — LIPID PANEL
HDL: 36 mg/dL — ABNORMAL LOW (ref >39)
LDL Cholesterol: 74 mg/dL (ref 0–99)

## 2012-03-18 LAB — RPR

## 2012-03-18 LAB — HEMOGLOBIN A1C: Mean Plasma Glucose: 126 mg/dL — ABNORMAL HIGH (ref <117)

## 2012-03-18 NOTE — Progress Notes (Signed)
Pt here for entry into A5322 HAILO study. Consent was discussed answering any questions and signed by SOP guidelines. Assessment unchanged since last study visit. Still c/o tingling in feet bilat. Fasting labs were obtained with no problems; completed questionnaires and performed neuro and frailty testing. Pt received $50 gift card for visit. Next appt scheduled for Tuesday, August 19, 2012 @ 9am. Tacey Heap RN

## 2012-03-19 LAB — T-HELPER CELL (CD4) - (RCID CLINIC ONLY): CD4 % Helper T Cell: 23 % — ABNORMAL LOW (ref 33–55)

## 2012-03-19 LAB — HIV-1 RNA QUANT-NO REFLEX-BLD
HIV 1 RNA Quant: <20 copies/mL (ref <20)
HIV-1 RNA Quant, Log: <1.3 {Log} (ref <1.30)

## 2012-03-19 LAB — HEPATITIS B SURFACE ANTIGEN: Hepatitis B Surface Ag: NEGATIVE

## 2012-03-19 LAB — CREATININE, URINE, RANDOM: Creatinine, Urine: 100.4 mg/dL

## 2012-03-19 LAB — PROTEIN, URINE, RANDOM: Total Protein, Urine: 19 mg/dL

## 2012-03-19 NOTE — Addendum Note (Signed)
Addended by: Nicolasa Ducking on: 03/19/2012 02:46 PM   Modules accepted: Orders

## 2012-03-24 ENCOUNTER — Encounter: Payer: Self-pay | Admitting: Gastroenterology

## 2012-03-24 ENCOUNTER — Encounter: Payer: Self-pay | Admitting: *Deleted

## 2012-03-24 ENCOUNTER — Other Ambulatory Visit: Payer: Self-pay | Admitting: *Deleted

## 2012-03-24 DIAGNOSIS — Z0000 Encounter for general adult medical examination without abnormal findings: Secondary | ICD-10-CM

## 2012-03-24 NOTE — Progress Notes (Signed)
Patient ID: Jonathan Estes, male   DOB: 1955-05-29, 57 y.o.   MRN: [id number]  Called patient to schedule for an ophthalmology. Pt stated he is seen at [facility] located at  [address], [place], [place] [address] [phone]). He was last seen on 07/19/2011. Will need him to sign release of info to get records. Will cancel ophthalmology referral. Tacey Heap RN

## 2012-03-25 ENCOUNTER — Other Ambulatory Visit: Payer: Medicare Other

## 2012-03-25 DIAGNOSIS — B20 Human immunodeficiency virus [HIV] disease: Secondary | ICD-10-CM

## 2012-03-25 LAB — CBC WITH DIFFERENTIAL/PLATELET
Basophils Absolute: 0.1 10*3/uL (ref 0.0–0.1)
Basophils Relative: 1 % (ref 0–1)
Eosinophils Absolute: 0.4 10*3/uL (ref 0.0–0.7)
HCT: 41.5 % (ref 39.0–52.0)
MCHC: 35.4 g/dL (ref 30.0–36.0)
Monocytes Absolute: 0.6 10*3/uL (ref 0.1–1.0)
Neutro Abs: 5.5 10*3/uL (ref 1.7–7.7)
RDW: 13.4 % (ref 11.5–15.5)

## 2012-04-07 ENCOUNTER — Other Ambulatory Visit: Payer: Self-pay | Admitting: Licensed Clinical Social Worker

## 2012-04-07 DIAGNOSIS — M3390 Dermatopolymyositis, unspecified, organ involvement unspecified: Secondary | ICD-10-CM

## 2012-04-07 MED ORDER — GLUCOSE BLOOD VI STRP: ORAL_STRIP | Status: DC

## 2012-04-09 ENCOUNTER — Ambulatory Visit: Payer: Medicare Other

## 2012-04-28 ENCOUNTER — Telehealth: Payer: Self-pay | Admitting: *Deleted

## 2012-04-28 NOTE — Telephone Encounter (Signed)
He's scheduled for a direct colon 05/13/12 and has his previsit tomorrow 06/27/12.  Thank you

## 2012-04-28 NOTE — Telephone Encounter (Signed)
error 

## 2012-04-28 NOTE — Telephone Encounter (Signed)
Would you please advise re: Jonathan Estes having colon at [facility] vs. [facility] or office visit prior to colon?

## 2012-04-28 NOTE — Telephone Encounter (Signed)
What is the reason for colonoscopy? If it's a direct referral for screening he does not need to be seen in the office and can be scheduled at the [facility]

## 2012-04-29 ENCOUNTER — Ambulatory Visit (AMBULATORY_SURGERY_CENTER): Payer: Medicare Other

## 2012-04-29 VITALS — Ht 70.0 in | Wt 191.3 lb

## 2012-04-29 DIAGNOSIS — Z1211 Encounter for screening for malignant neoplasm of colon: Secondary | ICD-10-CM

## 2012-04-29 DIAGNOSIS — Z800 Family history of malignant neoplasm of digestive organs: Secondary | ICD-10-CM

## 2012-04-29 MED ORDER — NA SULFATE-K SULFATE-MG SULF 17.5-3.13-1.6 GM/177ML PO SOLN: 1.0000 | Freq: Once | ORAL | Status: DC

## 2012-05-03 ENCOUNTER — Other Ambulatory Visit: Payer: Self-pay | Admitting: Infectious Disease

## 2012-05-12 ENCOUNTER — Telehealth: Payer: Self-pay | Admitting: Gastroenterology

## 2012-05-12 NOTE — Telephone Encounter (Signed)
noted 

## 2012-05-12 NOTE — Telephone Encounter (Signed)
ok 

## 2012-05-13 ENCOUNTER — Encounter: Payer: Medicare Other | Admitting: Gastroenterology

## 2012-05-22 ENCOUNTER — Ambulatory Visit (AMBULATORY_SURGERY_CENTER): Payer: Medicare Other | Admitting: Gastroenterology

## 2012-05-22 ENCOUNTER — Other Ambulatory Visit: Payer: Self-pay | Admitting: Gastroenterology

## 2012-05-22 ENCOUNTER — Encounter: Payer: Self-pay | Admitting: Gastroenterology

## 2012-05-22 VITALS — BP 114/70 | HR 63 | Temp 97.5°F | Resp 26 | Ht 70.0 in | Wt 191.0 lb

## 2012-05-22 DIAGNOSIS — K5730 Diverticulosis of large intestine without perforation or abscess without bleeding: Secondary | ICD-10-CM

## 2012-05-22 DIAGNOSIS — Z1211 Encounter for screening for malignant neoplasm of colon: Secondary | ICD-10-CM

## 2012-05-22 DIAGNOSIS — Z800 Family history of malignant neoplasm of digestive organs: Secondary | ICD-10-CM

## 2012-05-22 LAB — GLUCOSE, CAPILLARY

## 2012-05-22 MED ORDER — SODIUM CHLORIDE 0.9 % IV SOLN: 500.0000 mL | INTRAVENOUS | Status: DC

## 2012-05-22 NOTE — Op Note (Signed)
[facility] [address] [place], [address]   COLONOSCOPY PROCEDURE REPORT  PATIENT: Jonathan, Estes  MR#: #[id number] BIRTHDATE: 22-Mar-1955 , 56  yrs. old GENDER: Male ENDOSCOPIST: Louis Meckel, MD REFERRED BY: PROCEDURE DATE:  05/22/2012 PROCEDURE:   Colonoscopy, diagnostic ASA CLASS:   Class II INDICATIONS:elevated risk screening and Patient's immediate family history of colon cancer. MEDICATIONS: MAC sedation, administered by CRNA and propofol (Diprivan) 200mg  IV  DESCRIPTION OF PROCEDURE:   After the risks benefits and alternatives of the procedure were thoroughly explained, informed consent was obtained.  A digital rectal exam revealed no abnormalities of the rectum.   The LB [id number]  endoscope was introduced through the anus and advanced to the cecum, which was identified by both the appendix and ileocecal valve. No adverse events experienced.   The quality of the prep was Suprep good  The instrument was then slowly withdrawn as the colon was fully examined.      COLON FINDINGS: Mild diverticulosis was noted in the sigmoid colon. The colon mucosa was otherwise normal.  Retroflexed views revealed no abnormalities. The time to cecum=2 minutes 40 seconds. Withdrawal time=7 minutes 41 seconds.  The scope was withdrawn and the procedure completed. COMPLICATIONS: There were no complications.  ENDOSCOPIC IMPRESSION: 1.   Mild diverticulosis was noted in the sigmoid colon 2.   The colon mucosa was otherwise normal  RECOMMENDATIONS: Given your significant family history of colon cancer, you should have a repeat colonoscopy in 5 years   eSigned:  Louis Meckel, MD 05/22/2012 9:36 AM   cc: Leodis Liverpool, MD

## 2012-05-22 NOTE — Progress Notes (Signed)
Patient did not have preoperative order for IV antibiotic SSI prophylaxis. (G8918)  Patient did not experience any of the following events: a burn prior to discharge; a fall within the facility; wrong site/side/patient/procedure/implant event; or a hospital transfer or hospital admission upon discharge from the facility. (G8907)  

## 2012-05-22 NOTE — Patient Instructions (Addendum)
YOU HAD AN ENDOSCOPIC PROCEDURE TODAY AT THE [facility]: Refer to the procedure report that was given to you for any specific questions about what was found during the examination.  If the procedure report does not answer your questions, please call your gastroenterologist to clarify.  If you requested that your care partner not be given the details of your procedure findings, then the procedure report has been included in a sealed envelope for you to review at your convenience later.  YOU SHOULD EXPECT: Some feelings of bloating in the abdomen. Passage of more gas than usual.  Walking can help get rid of the air that was put into your GI tract during the procedure and reduce the bloating. If you had a lower endoscopy (such as a colonoscopy or flexible sigmoidoscopy) you may notice spotting of blood in your stool or on the toilet paper. If you underwent a bowel prep for your procedure, then you may not have a normal bowel movement for a few days.  DIET: Your first meal following the procedure should be a light meal and then it is ok to progress to your normal diet.  A half-sandwich or bowl of soup is an example of a good first meal.  Heavy or fried foods are harder to digest and may make you feel nauseous or bloated.  Likewise meals heavy in dairy and vegetables can cause extra gas to form and this can also increase the bloating.  Drink plenty of fluids but you should avoid alcoholic beverages for 24 hours.  ACTIVITY: Your care partner should take you home directly after the procedure.  You should plan to take it easy, moving slowly for the rest of the day.  You can resume normal activity the day after the procedure however you should NOT DRIVE or use heavy machinery for 24 hours (because of the sedation medicines used during the test).    SYMPTOMS TO REPORT IMMEDIATELY: A gastroenterologist can be reached at any hour.  During normal business hours, 8:30 AM to 5:00 PM Monday through Friday,  call [phone].  After hours and on weekends, please call the GI answering service at [phone] who will take a message and have the physician on call contact you.   Following lower endoscopy (colonoscopy or flexible sigmoidoscopy):  Excessive amounts of blood in the stool  Significant tenderness or worsening of abdominal pains  Swelling of the abdomen that is new, acute  Fever of 100F or higher    FOLLOW UP: If any biopsies were taken you will be contacted by phone or by letter within the next 1-3 weeks.  Call your gastroenterologist if you have not heard about the biopsies in 3 weeks.  Our staff will call the home number listed on your records the next business day following your procedure to check on you and address any questions or concerns that you may have at that time regarding the information given to you following your procedure. This is a courtesy call and so if there is no answer at the home number and we have not heard from you through the emergency physician on call, we will assume that you have returned to your regular daily activities without incident.  SIGNATURES/CONFIDENTIALITY: You and/or your care partner have signed paperwork which will be entered into your electronic medical record.  These signatures attest to the fact that that the information above on your After Visit Summary has been reviewed and is understood.  Full responsibility of the confidentiality   of this discharge information lies with you and/or your care-partner.     

## 2012-05-22 NOTE — Progress Notes (Signed)
Report to pacu rn, vss, bbs=clear 

## 2012-05-23 ENCOUNTER — Telehealth: Payer: Self-pay | Admitting: *Deleted

## 2012-05-23 NOTE — Telephone Encounter (Signed)
  Follow up Call-  Call back number 05/22/2012  Post procedure Call Back phone  # [phone] or  [phone] (parents)  Permission to leave phone message Yes     Patient questions:  Do you have a fever, pain , or abdominal swelling? no Pain Score  0 *  Have you tolerated food without any problems? yes  Have you been able to return to your normal activities? yes  Do you have any questions about your discharge instructions: Diet   no Medications  no Follow up visit  no  Do you have questions or concerns about your Care? no  Actions: * If pain score is 4 or above: No action needed, pain <4.

## 2012-06-04 ENCOUNTER — Other Ambulatory Visit: Payer: Self-pay | Admitting: Infectious Disease

## 2012-06-05 ENCOUNTER — Other Ambulatory Visit: Payer: Self-pay | Admitting: Licensed Clinical Social Worker

## 2012-06-05 DIAGNOSIS — F419 Anxiety disorder, unspecified: Secondary | ICD-10-CM

## 2012-06-05 MED ORDER — ALPRAZOLAM 0.5 MG PO TABS: 0.5000 mg | ORAL_TABLET | Freq: Three times a day (TID) | ORAL | Status: DC | PRN

## 2012-06-11 ENCOUNTER — Other Ambulatory Visit: Payer: Medicare Other

## 2012-06-25 ENCOUNTER — Ambulatory Visit (INDEPENDENT_AMBULATORY_CARE_PROVIDER_SITE_OTHER): Payer: Medicare Other | Admitting: Infectious Disease

## 2012-06-25 VITALS — BP 110/72 | HR 60 | Temp 97.5°F | Ht 70.0 in | Wt 194.0 lb

## 2012-06-25 DIAGNOSIS — E039 Hypothyroidism, unspecified: Secondary | ICD-10-CM

## 2012-06-25 DIAGNOSIS — E669 Obesity, unspecified: Secondary | ICD-10-CM

## 2012-06-25 DIAGNOSIS — B20 Human immunodeficiency virus [HIV] disease: Secondary | ICD-10-CM

## 2012-06-25 DIAGNOSIS — E119 Type 2 diabetes mellitus without complications: Secondary | ICD-10-CM

## 2012-06-25 NOTE — Progress Notes (Signed)
  Subjective:    Patient ID: Jonathan Estes, male    DOB: 12-12-1955, 57 y.o.   MRN: [id number]  HPI   Jonathan Estes is a 57 y.o. male who is doing superbly well on their antiviral regimen of isentress and truvada, with undetectable viral load and health cd4 count. He also does have hypothyroidism and diabetes mellitus on metformin--though last a1c was 6. He has no recent labs but feels well and is without any problems.     Review of Systems  Constitutional: Negative for fever, chills, diaphoresis, activity change, appetite change, fatigue and unexpected weight change.  HENT: Negative for congestion, sore throat, rhinorrhea, sneezing, trouble swallowing and sinus pressure.   Eyes: Negative for photophobia and visual disturbance.  Respiratory: Negative for cough, chest tightness, shortness of breath, wheezing and stridor.   Cardiovascular: Negative for chest pain, palpitations and leg swelling.  Gastrointestinal: Negative for nausea, vomiting, abdominal pain, diarrhea, constipation, blood in stool, abdominal distention and anal bleeding.  Genitourinary: Negative for dysuria, hematuria, flank pain and difficulty urinating.  Musculoskeletal: Negative for myalgias, back pain, joint swelling, arthralgias and gait problem.  Skin: Negative for color change, pallor, rash and wound.  Neurological: Negative for dizziness, tremors, weakness and light-headedness.  Hematological: Negative for adenopathy. Does not bruise/bleed easily.  Psychiatric/Behavioral: Negative for behavioral problems, confusion, sleep disturbance, dysphoric mood, decreased concentration and agitation.       Objective:   Physical Exam  Constitutional: He is oriented to person, place, and time. He appears well-developed and well-nourished. No distress.  HENT:  Head: Normocephalic and atraumatic.  Mouth/Throat: Oropharynx is clear and moist. No oropharyngeal exudate.  Eyes: Conjunctivae and EOM are normal. Pupils are  equal, round, and reactive to light. No scleral icterus.  Neck: Normal range of motion. Neck supple. No JVD present.  Cardiovascular: Normal rate, regular rhythm and normal heart sounds.  Exam reveals no gallop and no friction rub.   No murmur heard. Pulmonary/Chest: Effort normal and breath sounds normal. No respiratory distress. He has no wheezes. He has no rales. He exhibits no tenderness.  Abdominal: He exhibits no distension and no mass. There is no tenderness. There is no rebound and no guarding.  Musculoskeletal: He exhibits no edema and no tenderness.  Lymphadenopathy:    He has no cervical adenopathy.  Neurological: He is alert and oriented to person, place, and time. He has normal reflexes. He exhibits normal muscle tone. Coordination normal.  Skin: Skin is warm and dry. He is not diaphoretic. No erythema. No pallor.  Psychiatric: He has a normal mood and affect. His behavior is normal. Judgment and thought content normal.          Assessment & Plan:  HIV: continue current regimen, recheck labs in August  Hypothyroidism: recheck in august  DM: reasonable control  HTN: welll controlled

## 2012-07-15 ENCOUNTER — Ambulatory Visit (INDEPENDENT_AMBULATORY_CARE_PROVIDER_SITE_OTHER): Payer: Medicare Other | Admitting: *Deleted

## 2012-07-15 VITALS — BP 107/69 | HR 50 | Temp 97.5°F | Ht 68.75 in | Wt 187.8 lb

## 2012-07-15 DIAGNOSIS — Z21 Asymptomatic human immunodeficiency virus [HIV] infection status: Secondary | ICD-10-CM

## 2012-07-15 DIAGNOSIS — B20 Human immunodeficiency virus [HIV] disease: Secondary | ICD-10-CM

## 2012-07-15 NOTE — Progress Notes (Signed)
  Subjective:    Patient ID: Jonathan Estes, male    DOB: 25-Sep-1955, 57 y.o.   MRN: [id number]  HPI    Review of Systems  Constitutional: Negative.   HENT: Negative.   Eyes: Negative.   Respiratory: Positive for shortness of breath.   Cardiovascular: Negative.   Gastrointestinal: Positive for diarrhea.  Genitourinary: Positive for difficulty urinating.  Musculoskeletal: Positive for myalgias and arthralgias.  Skin: Positive for rash.  Neurological: Negative.   Hematological: Negative.   Psychiatric/Behavioral: Negative.        Objective:   Physical Exam  Constitutional: He appears well-developed and well-nourished.  HENT:  Mouth/Throat: Oropharynx is clear and moist.  Eyes: Pupils are equal, round, and reactive to light.  Neck: Normal range of motion.  Cardiovascular: Normal rate, regular rhythm and normal heart sounds.   Occasional extrasystoles are present.  Pulmonary/Chest: Breath sounds normal. No respiratory distress.  Abdominal: Soft. Bowel sounds are normal. There is no hepatomegaly. There is no rebound.  Lymphadenopathy:       Head (right side): No submandibular, no preauricular, no posterior auricular and no occipital adenopathy present.       Head (left side): No submandibular, no preauricular, no posterior auricular and no occipital adenopathy present.    He has no axillary adenopathy.       Right: No supraclavicular adenopathy present.       Left: No supraclavicular adenopathy present.  Skin: Skin is warm and dry.  Psychiatric: He has a normal mood and affect.          Assessment & Plan:  Patient is here today to screen for A5321, the [facility] study looking at viral decay reservoirs. Informed consent obtained per sop guidelines. He will return in a few weeks once his Viral load results come back for entry. He c/o generalized joint and muscle aches, fatigue, sob with exertion, trouble with starting urination occasionally. Also c/o numbness in both feet. He has  scattered rashes and lesions over his body which he says are from bug bites.

## 2012-07-29 ENCOUNTER — Other Ambulatory Visit: Payer: Self-pay | Admitting: *Deleted

## 2012-07-29 DIAGNOSIS — B20 Human immunodeficiency virus [HIV] disease: Secondary | ICD-10-CM

## 2012-07-29 MED ORDER — EMTRICITABINE-TENOFOVIR DF 200-300 MG PO TABS: 1.0000 | ORAL_TABLET | Freq: Every day | ORAL | Status: DC

## 2012-07-29 MED ORDER — RALTEGRAVIR POTASSIUM 400 MG PO TABS: 400.0000 mg | ORAL_TABLET | Freq: Two times a day (BID) | ORAL | Status: DC

## 2012-08-01 ENCOUNTER — Other Ambulatory Visit: Payer: Self-pay | Admitting: Infectious Disease

## 2012-08-02 ENCOUNTER — Other Ambulatory Visit: Payer: Self-pay | Admitting: Infectious Disease

## 2012-08-20 ENCOUNTER — Ambulatory Visit (INDEPENDENT_AMBULATORY_CARE_PROVIDER_SITE_OTHER): Payer: Medicare Other | Admitting: *Deleted

## 2012-08-20 ENCOUNTER — Encounter: Payer: Self-pay | Admitting: *Deleted

## 2012-08-20 VITALS — BP 110/68 | HR 56 | Temp 97.9°F | Resp 17 | Wt 189.8 lb

## 2012-08-20 DIAGNOSIS — Z21 Asymptomatic human immunodeficiency virus [HIV] infection status: Secondary | ICD-10-CM

## 2012-08-20 DIAGNOSIS — B20 Human immunodeficiency virus [HIV] disease: Secondary | ICD-10-CM

## 2012-08-20 LAB — COMPREHENSIVE METABOLIC PANEL
ALT: 8 U/L (ref 0–53)
Albumin: 4.7 g/dL (ref 3.5–5.2)
CO2: 22 mEq/L (ref 19–32)
Calcium: 9.4 mg/dL (ref 8.4–10.5)
Chloride: 102 mEq/L (ref 96–112)
Creat: 1.06 mg/dL (ref 0.50–1.35)
Potassium: 4.3 mEq/L (ref 3.5–5.3)
Total Protein: 7.6 g/dL (ref 6.0–8.3)

## 2012-08-20 NOTE — Progress Notes (Signed)
Here for A5321 entry visit. Lesions/rash from bug bites have cleared. Pt denies any new findings, problems, or symptoms. He is adhering to regimen. Fasting labs were drawn and vital signs are stable. He was given $50 gift card for study. Next appointment for Sept 8, 2014 @ 9:15am or after seeing Dr. Daiva Eves. Tacey Heap RN

## 2012-09-03 ENCOUNTER — Other Ambulatory Visit: Payer: Self-pay | Admitting: Infectious Disease

## 2012-09-17 ENCOUNTER — Ambulatory Visit: Payer: Medicare Other

## 2012-09-17 ENCOUNTER — Encounter: Payer: Self-pay | Admitting: Infectious Disease

## 2012-09-17 LAB — HIV-1 RNA QUANT-NO REFLEX-BLD: HIV-1 RNA Viral Load: <40

## 2012-09-17 LAB — CD4/CD8 (T-HELPER/T-SUPPRESSOR CELL): CD4%: 26.7

## 2012-09-30 ENCOUNTER — Encounter: Payer: Self-pay | Admitting: Infectious Disease

## 2012-10-03 ENCOUNTER — Other Ambulatory Visit: Payer: Self-pay | Admitting: Infectious Disease

## 2012-10-22 ENCOUNTER — Other Ambulatory Visit: Payer: Medicare Other

## 2012-11-03 ENCOUNTER — Other Ambulatory Visit: Payer: Self-pay | Admitting: Infectious Disease

## 2012-11-04 ENCOUNTER — Other Ambulatory Visit: Payer: Self-pay | Admitting: *Deleted

## 2012-11-04 DIAGNOSIS — E119 Type 2 diabetes mellitus without complications: Secondary | ICD-10-CM

## 2012-11-04 DIAGNOSIS — J302 Other seasonal allergic rhinitis: Secondary | ICD-10-CM

## 2012-11-04 MED ORDER — CETIRIZINE HCL 10 MG PO TABS: 10.0000 mg | ORAL_TABLET | Freq: Every day | ORAL | Status: DC

## 2012-11-04 MED ORDER — METFORMIN HCL 1000 MG PO TABS: 1000.0000 mg | ORAL_TABLET | Freq: Two times a day (BID) | ORAL | Status: DC

## 2012-11-05 ENCOUNTER — Ambulatory Visit: Payer: Medicare Other | Admitting: Infectious Disease

## 2012-11-10 ENCOUNTER — Ambulatory Visit (INDEPENDENT_AMBULATORY_CARE_PROVIDER_SITE_OTHER): Payer: Medicare Other | Admitting: Infectious Disease

## 2012-11-10 ENCOUNTER — Ambulatory Visit (INDEPENDENT_AMBULATORY_CARE_PROVIDER_SITE_OTHER): Payer: Self-pay | Admitting: *Deleted

## 2012-11-10 ENCOUNTER — Encounter: Payer: Self-pay | Admitting: Infectious Disease

## 2012-11-10 VITALS — BP 107/70 | HR 69 | Temp 97.6°F | Ht 69.0 in | Wt 190.0 lb

## 2012-11-10 VITALS — BP 107/70 | HR 69 | Temp 97.6°F | Wt 190.0 lb

## 2012-11-10 DIAGNOSIS — Z21 Asymptomatic human immunodeficiency virus [HIV] infection status: Secondary | ICD-10-CM

## 2012-11-10 DIAGNOSIS — I25810 Atherosclerosis of coronary artery bypass graft(s) without angina pectoris: Secondary | ICD-10-CM

## 2012-11-10 DIAGNOSIS — E039 Hypothyroidism, unspecified: Secondary | ICD-10-CM

## 2012-11-10 DIAGNOSIS — Z23 Encounter for immunization: Secondary | ICD-10-CM

## 2012-11-10 DIAGNOSIS — I10 Essential (primary) hypertension: Secondary | ICD-10-CM

## 2012-11-10 DIAGNOSIS — B20 Human immunodeficiency virus [HIV] disease: Secondary | ICD-10-CM

## 2012-11-10 DIAGNOSIS — E119 Type 2 diabetes mellitus without complications: Secondary | ICD-10-CM

## 2012-11-10 NOTE — Progress Notes (Signed)
Pt here for A5322 study, week 24. Assessment unchanged since last study visit. Adhering to his ARV regimen. Vital signs are stable. He received $50 gift card for visit. An RPR draw was declined by participant. Stated he did not need one since he is not sexually active. Next appt scheduled for Monday, February 02, 2013 @ 9am.

## 2012-11-10 NOTE — Progress Notes (Signed)
  Subjective:    Patient ID: Jonathan Estes, male    DOB: Sep 09, 1955, 57 y.o.   MRN: [id number]  HPI   Jonathan Estes is a 57 y.o. male who is doing superbly well on their antiviral regimen of isentress and truvada, with undetectable viral load and health cd4 count. He also does have hypothyroidism and diabetes mellitus on metformin--though last a1c was 6. He has had repeat eye exam which went very well. He has no complaints. He missed one Am dose of isentress once hin the past 3 months.    Review of Systems  Constitutional: Negative for fever, chills, diaphoresis, activity change, appetite change, fatigue and unexpected weight change.  HENT: Negative for congestion, sore throat, rhinorrhea, sneezing, trouble swallowing and sinus pressure.   Eyes: Negative for photophobia and visual disturbance.  Respiratory: Negative for cough, chest tightness, shortness of breath, wheezing and stridor.   Cardiovascular: Negative for chest pain, palpitations and leg swelling.  Gastrointestinal: Negative for nausea, vomiting, abdominal pain, diarrhea, constipation, blood in stool, abdominal distention and anal bleeding.  Genitourinary: Negative for dysuria, hematuria, flank pain and difficulty urinating.  Musculoskeletal: Negative for myalgias, back pain, joint swelling, arthralgias and gait problem.  Skin: Negative for color change, pallor, rash and wound.  Neurological: Negative for dizziness, tremors, weakness and light-headedness.  Hematological: Negative for adenopathy. Does not bruise/bleed easily.  Psychiatric/Behavioral: Negative for behavioral problems, confusion, sleep disturbance, dysphoric mood, decreased concentration and agitation.       Objective:   Physical Exam  Constitutional: He is oriented to person, place, and time. He appears well-developed and well-nourished. No distress.  HENT:  Head: Normocephalic and atraumatic.  Mouth/Throat: Oropharynx is clear and moist. No oropharyngeal  exudate.  Eyes: Conjunctivae and EOM are normal. Pupils are equal, round, and reactive to light. No scleral icterus.  Neck: Normal range of motion. Neck supple. No JVD present.  Cardiovascular: Normal rate, regular rhythm and normal heart sounds.  Exam reveals no gallop and no friction rub.   No murmur heard. Pulmonary/Chest: Effort normal and breath sounds normal. No respiratory distress. He has no wheezes. He has no rales. He exhibits no tenderness.  Abdominal: He exhibits no distension and no mass. There is no tenderness. There is no rebound and no guarding.  Musculoskeletal: He exhibits no edema and no tenderness.  Lymphadenopathy:    He has no cervical adenopathy.  Neurological: He is alert and oriented to person, place, and time. He has normal reflexes. He exhibits normal muscle tone. Coordination normal.  Skin: Skin is warm and dry. He is not diaphoretic. No erythema. No pallor.  Psychiatric: He has a normal mood and affect. His behavior is normal. Judgment and thought content normal.          Assessment & Plan:  HIV: continue current regimen, rtc in 6 months  Hypothyroidism: recheck today  DM: reasonable control, check today  HTN: welll controlled  CAD: seen on CT angio. No need for new interventions, no ssx

## 2012-11-11 LAB — TSH: TSH: 1.517 u[IU]/mL (ref 0.350–4.500)

## 2012-12-04 ENCOUNTER — Other Ambulatory Visit: Payer: Self-pay | Admitting: Infectious Disease

## 2012-12-25 ENCOUNTER — Other Ambulatory Visit: Payer: Self-pay | Admitting: Infectious Disease

## 2012-12-25 DIAGNOSIS — F411 Generalized anxiety disorder: Secondary | ICD-10-CM

## 2013-01-03 ENCOUNTER — Other Ambulatory Visit: Payer: Self-pay | Admitting: Infectious Disease

## 2013-01-31 ENCOUNTER — Other Ambulatory Visit: Payer: Self-pay | Admitting: Infectious Disease

## 2013-02-01 ENCOUNTER — Other Ambulatory Visit: Payer: Self-pay | Admitting: Infectious Disease

## 2013-02-02 ENCOUNTER — Ambulatory Visit (INDEPENDENT_AMBULATORY_CARE_PROVIDER_SITE_OTHER): Payer: Self-pay | Admitting: *Deleted

## 2013-02-02 ENCOUNTER — Other Ambulatory Visit: Payer: Medicare Other

## 2013-02-02 VITALS — BP 115/69 | HR 69 | Temp 97.8°F | Resp 16 | Wt 192.0 lb

## 2013-02-02 DIAGNOSIS — Z21 Asymptomatic human immunodeficiency virus [HIV] infection status: Secondary | ICD-10-CM

## 2013-02-02 DIAGNOSIS — B20 Human immunodeficiency virus [HIV] disease: Secondary | ICD-10-CM

## 2013-02-02 LAB — COMPREHENSIVE METABOLIC PANEL
ALT: 9 U/L (ref 0–53)
AST: 16 U/L (ref 0–37)
Albumin: 4.7 g/dL (ref 3.5–5.2)
CO2: 24 mEq/L (ref 19–32)
Calcium: 9.8 mg/dL (ref 8.4–10.5)
Chloride: 105 mEq/L (ref 96–112)
Creat: 1.22 mg/dL (ref 0.50–1.35)
Potassium: 4.8 mEq/L (ref 3.5–5.3)
Total Protein: 7.6 g/dL (ref 6.0–8.3)

## 2013-02-02 LAB — CBC WITH DIFFERENTIAL/PLATELET
Basophils Absolute: 0.1 10*3/uL (ref 0.0–0.1)
Basophils Relative: 1 % (ref 0–1)
Eosinophils Absolute: 0.4 10*3/uL (ref 0.0–0.7)
HCT: 38.1 % — ABNORMAL LOW (ref 39.0–52.0)
Hemoglobin: 13.9 g/dL (ref 13.0–17.0)
Lymphocytes Relative: 29 % (ref 12–46)
MCH: 32.3 pg (ref 26.0–34.0)
MCHC: 36.5 g/dL — ABNORMAL HIGH (ref 30.0–36.0)
Monocytes Absolute: 0.6 10*3/uL (ref 0.1–1.0)
Neutro Abs: 3.7 10*3/uL (ref 1.7–7.7)
Platelets: 290 10*3/uL (ref 150–400)
RDW: 13 % (ref 11.5–15.5)
WBC: 6.7 10*3/uL (ref 4.0–10.5)

## 2013-02-02 NOTE — Progress Notes (Signed)
Patient here for week 24 A5321 study visit. He denies any new problems or concerns. He will return in March for study Z6109 the same day he is seeing Dr. Daiva Eves.

## 2013-02-03 LAB — T-HELPER CELL (CD4) - (RCID CLINIC ONLY): CD4 % Helper T Cell: 26 % — ABNORMAL LOW (ref 33–55)

## 2013-02-20 ENCOUNTER — Encounter: Payer: Self-pay | Admitting: Infectious Disease

## 2013-03-18 ENCOUNTER — Encounter: Payer: Self-pay | Admitting: Infectious Disease

## 2013-04-06 ENCOUNTER — Ambulatory Visit: Payer: Self-pay | Admitting: *Deleted

## 2013-04-06 ENCOUNTER — Other Ambulatory Visit: Payer: Self-pay | Admitting: Licensed Clinical Social Worker

## 2013-04-06 DIAGNOSIS — B20 Human immunodeficiency virus [HIV] disease: Secondary | ICD-10-CM

## 2013-04-06 MED ORDER — RALTEGRAVIR POTASSIUM 400 MG PO TABS: 400.0000 mg | ORAL_TABLET | Freq: Two times a day (BID) | ORAL | Status: DC

## 2013-04-06 MED ORDER — EMTRICITABINE-TENOFOVIR DF 200-300 MG PO TABS: 1.0000 | ORAL_TABLET | Freq: Every day | ORAL | Status: DC

## 2013-05-04 ENCOUNTER — Ambulatory Visit (INDEPENDENT_AMBULATORY_CARE_PROVIDER_SITE_OTHER): Payer: Medicare Other | Admitting: Infectious Disease

## 2013-05-04 ENCOUNTER — Ambulatory Visit (INDEPENDENT_AMBULATORY_CARE_PROVIDER_SITE_OTHER): Payer: Self-pay | Admitting: *Deleted

## 2013-05-04 ENCOUNTER — Encounter: Payer: Self-pay | Admitting: Infectious Disease

## 2013-05-04 VITALS — BP 105/71 | HR 60 | Temp 97.8°F | Resp 16 | Ht 69.0 in | Wt 195.0 lb

## 2013-05-04 VITALS — BP 105/71 | HR 60 | Resp 16 | Ht 69.0 in | Wt 195.0 lb

## 2013-05-04 DIAGNOSIS — Z21 Asymptomatic human immunodeficiency virus [HIV] infection status: Secondary | ICD-10-CM

## 2013-05-04 DIAGNOSIS — E785 Hyperlipidemia, unspecified: Secondary | ICD-10-CM

## 2013-05-04 DIAGNOSIS — I25810 Atherosclerosis of coronary artery bypass graft(s) without angina pectoris: Secondary | ICD-10-CM

## 2013-05-04 DIAGNOSIS — B20 Human immunodeficiency virus [HIV] disease: Secondary | ICD-10-CM

## 2013-05-04 DIAGNOSIS — E1159 Type 2 diabetes mellitus with other circulatory complications: Secondary | ICD-10-CM

## 2013-05-04 LAB — LIPID PANEL
Cholesterol: 127 mg/dL (ref 0–200)
HDL: 39 mg/dL — ABNORMAL LOW (ref >39)
LDL Cholesterol: 64 mg/dL (ref 0–99)
TRIGLYCERIDES: 120 mg/dL (ref <150)
Total CHOL/HDL Ratio: 3.3 Ratio
VLDL: 24 mg/dL (ref 0–40)

## 2013-05-04 LAB — CBC WITH DIFFERENTIAL/PLATELET
BASOS ABS: 0.1 10*3/uL (ref 0.0–0.1)
Basophils Relative: 1 % (ref 0–1)
EOS PCT: 5 % (ref 0–5)
Eosinophils Absolute: 0.4 10*3/uL (ref 0.0–0.7)
HEMATOCRIT: 41.9 % (ref 39.0–52.0)
HEMOGLOBIN: 14.8 g/dL (ref 13.0–17.0)
LYMPHS ABS: 2.2 10*3/uL (ref 0.7–4.0)
Lymphocytes Relative: 28 % (ref 12–46)
MCH: 32.1 pg (ref 26.0–34.0)
MCHC: 35.3 g/dL (ref 30.0–36.0)
MCV: 90.9 fL (ref 78.0–100.0)
MONO ABS: 0.6 10*3/uL (ref 0.1–1.0)
Monocytes Relative: 7 % (ref 3–12)
Neutro Abs: 4.7 10*3/uL (ref 1.7–7.7)
Neutrophils Relative %: 59 % (ref 43–77)
Platelets: 292 10*3/uL (ref 150–400)
RBC: 4.61 MIL/uL (ref 4.22–5.81)
RDW: 13.3 % (ref 11.5–15.5)
WBC: 7.9 10*3/uL (ref 4.0–10.5)

## 2013-05-04 LAB — COMPREHENSIVE METABOLIC PANEL
ALBUMIN: 4.9 g/dL (ref 3.5–5.2)
ALT: 11 U/L (ref 0–53)
AST: 17 U/L (ref 0–37)
Alkaline Phosphatase: 102 U/L (ref 39–117)
BUN: 17 mg/dL (ref 6–23)
CALCIUM: 9.8 mg/dL (ref 8.4–10.5)
CHLORIDE: 100 meq/L (ref 96–112)
CO2: 29 mEq/L (ref 19–32)
CREATININE: 1.07 mg/dL (ref 0.50–1.35)
Glucose, Bld: 85 mg/dL (ref 70–99)
POTASSIUM: 4.4 meq/L (ref 3.5–5.3)
Sodium: 137 mEq/L (ref 135–145)
Total Bilirubin: 0.3 mg/dL (ref 0.2–1.2)
Total Protein: 7.7 g/dL (ref 6.0–8.3)

## 2013-05-04 LAB — HEMOGLOBIN A1C
HEMOGLOBIN A1C: 5.8 % — AB (ref <5.7)
Mean Plasma Glucose: 120 mg/dL — ABNORMAL HIGH (ref <117)

## 2013-05-04 MED ORDER — PRAVASTATIN SODIUM 20 MG PO TABS: 20.0000 mg | ORAL_TABLET | Freq: Every day | ORAL | Status: DC

## 2013-05-04 NOTE — Progress Notes (Signed)
Patient here for week 48 A5322 visit. He denies any new problems or concerns. He continues to have some numbness in both feet. He will also see Dr. [place] today. He will return the first of June for A5321 study visit.

## 2013-05-04 NOTE — Progress Notes (Signed)
  Subjective:    Patient ID: Jonathan Estes, male    DOB: [birth year], 58 y.o.   MRN: [id number]  HPI   Jonathan Estes is a 58 y.o. male who is doing superbly well on their antiviral regimen of isentress and truvada, with undetectable viral load and health cd4 count. He also does have hypothyroidism and diabetes mellitus on metformin--though last a1c was 6. He has had repeat eye exam which went very well.   Review of Systems  Constitutional: Negative for fever, chills, diaphoresis, activity change, appetite change, fatigue and unexpected weight change.  HENT: Negative for congestion, rhinorrhea, sinus pressure, sneezing, sore throat and trouble swallowing.   Eyes: Negative for photophobia and visual disturbance.  Respiratory: Negative for cough, chest tightness, shortness of breath, wheezing and stridor.   Cardiovascular: Negative for chest pain, palpitations and leg swelling.  Gastrointestinal: Negative for nausea, vomiting, abdominal pain, diarrhea, constipation, blood in stool, abdominal distention and anal bleeding.  Genitourinary: Negative for dysuria, hematuria, flank pain and difficulty urinating.  Musculoskeletal: Negative for arthralgias, back pain, gait problem, joint swelling and myalgias.  Skin: Negative for color change, pallor, rash and wound.  Neurological: Negative for dizziness, tremors, weakness and light-headedness.  Hematological: Negative for adenopathy. Does not bruise/bleed easily.  Psychiatric/Behavioral: Negative for behavioral problems, confusion, sleep disturbance, dysphoric mood, decreased concentration and agitation.       Objective:   Physical Exam  Constitutional: He is oriented to person, place, and time. He appears well-developed and well-nourished. No distress.  HENT:  Head: Normocephalic and atraumatic.  Mouth/Throat: Oropharynx is clear and moist. No oropharyngeal exudate.  Eyes: Conjunctivae and EOM are normal. Pupils are equal, round, and reactive  to light. No scleral icterus.  Neck: Normal range of motion. Neck supple. No JVD present.  Cardiovascular: Normal rate, regular rhythm and normal heart sounds.  Exam reveals no gallop and no friction rub.   No murmur heard. Pulmonary/Chest: Effort normal and breath sounds normal. No respiratory distress. He has no wheezes. He has no rales. He exhibits no tenderness.  Abdominal: He exhibits no distension and no mass. There is no tenderness. There is no rebound and no guarding.  Musculoskeletal: He exhibits no edema and no tenderness.  Lymphadenopathy:    He has no cervical adenopathy.  Neurological: He is alert and oriented to person, place, and time. He has normal reflexes. He exhibits normal muscle tone. Coordination normal.  Skin: Skin is warm and dry. He is not diaphoretic. No erythema. No pallor.  Psychiatric: He has a normal mood and affect. His behavior is normal. Judgment and thought content normal.          Assessment & Plan:  HIV: continue current regimen, rtc in 6 months  CAD: dc his lopid and place him on a low dose statin for anti-inflammatory properties and to bring LDL less than 70, daily aspirin, consider swapping out his diuretic for beta blocker in future. He is on ACEI.   Hypothyroidism: recheck today  DM: reasonable control, check today  HTN: welll controlled, see above  Hyerlipidemia: see above. DC lopid, low dose statin and recheck lipids in several months  CAD: seen on CT angio. No need for new interventions, no ssx  Anxiety and depression: on xanax prn anxiety. Revisit in several months

## 2013-05-05 LAB — PROTEIN, URINE, RANDOM: Total Protein, Urine: 8 mg/dL

## 2013-05-05 LAB — CREATININE, URINE, RANDOM: CREATININE, URINE: 45.5 mg/dL

## 2013-05-07 ENCOUNTER — Telehealth: Payer: Self-pay

## 2013-05-07 NOTE — Telephone Encounter (Signed)
Patient states  Dr Daiva EvesVan Dam wanted to know the name of his eye doctor.  It is DR Wynona LunaSteven [name] on [address], [place], [place] .

## 2013-05-19 ENCOUNTER — Other Ambulatory Visit: Payer: Self-pay | Admitting: Infectious Disease

## 2013-05-20 ENCOUNTER — Other Ambulatory Visit: Payer: Self-pay | Admitting: *Deleted

## 2013-05-20 DIAGNOSIS — F411 Generalized anxiety disorder: Secondary | ICD-10-CM

## 2013-05-20 MED ORDER — ALPRAZOLAM 0.5 MG PO TABS: ORAL_TABLET | ORAL | Status: DC

## 2013-06-25 ENCOUNTER — Other Ambulatory Visit: Payer: Self-pay | Admitting: Infectious Disease

## 2013-06-25 DIAGNOSIS — B20 Human immunodeficiency virus [HIV] disease: Secondary | ICD-10-CM

## 2013-07-02 ENCOUNTER — Other Ambulatory Visit: Payer: Self-pay | Admitting: Infectious Disease

## 2013-07-31 ENCOUNTER — Other Ambulatory Visit: Payer: Self-pay | Admitting: Infectious Disease

## 2013-08-01 ENCOUNTER — Other Ambulatory Visit: Payer: Self-pay | Admitting: Infectious Disease

## 2013-08-03 ENCOUNTER — Telehealth: Payer: Self-pay | Admitting: *Deleted

## 2013-08-03 ENCOUNTER — Ambulatory Visit (INDEPENDENT_AMBULATORY_CARE_PROVIDER_SITE_OTHER): Payer: Self-pay | Admitting: *Deleted

## 2013-08-03 ENCOUNTER — Other Ambulatory Visit: Payer: Self-pay | Admitting: *Deleted

## 2013-08-03 VITALS — BP 111/74 | HR 60 | Temp 97.6°F | Resp 16 | Wt 195.2 lb

## 2013-08-03 DIAGNOSIS — Z21 Asymptomatic human immunodeficiency virus [HIV] infection status: Secondary | ICD-10-CM

## 2013-08-03 DIAGNOSIS — B20 Human immunodeficiency virus [HIV] disease: Secondary | ICD-10-CM

## 2013-08-03 DIAGNOSIS — J309 Allergic rhinitis, unspecified: Secondary | ICD-10-CM

## 2013-08-03 LAB — COMPREHENSIVE METABOLIC PANEL
ALK PHOS: 74 U/L (ref 39–117)
ALT: 10 U/L (ref 0–53)
AST: 14 U/L (ref 0–37)
Albumin: 4.3 g/dL (ref 3.5–5.2)
BILIRUBIN TOTAL: 0.3 mg/dL (ref 0.2–1.2)
BUN: 14 mg/dL (ref 6–23)
CO2: 26 mEq/L (ref 19–32)
Calcium: 9.5 mg/dL (ref 8.4–10.5)
Chloride: 103 mEq/L (ref 96–112)
Creat: 1.1 mg/dL (ref 0.50–1.35)
Glucose, Bld: 93 mg/dL (ref 70–99)
Potassium: 4.1 mEq/L (ref 3.5–5.3)
Sodium: 136 mEq/L (ref 135–145)
Total Protein: 7.3 g/dL (ref 6.0–8.3)

## 2013-08-03 MED ORDER — CETIRIZINE HCL 10 MG PO TABS: ORAL_TABLET | ORAL | Status: DC

## 2013-08-03 NOTE — Progress Notes (Signed)
Jonathan Estes is here for his week 48 A5321 study visit. He is complaining of lower back pain and stiffness. He said it has been bothering him ever since he did some shoveling in his garden in March. It is getting better though. He has been taking aleve off and on to help with pain. He will return in September for the next study visit and to see Dr. Daiva Eves.

## 2013-08-03 NOTE — Telephone Encounter (Signed)
Called patient to advise that his insurance will not authorize any more refills on his Zyrtec as it is over the counter and he can get it that way. They will approve other allergy medications that are not. Had to leave a message for the patient to call the clinic if he has any questions.

## 2013-08-04 LAB — HEPATITIS C ANTIBODY: HCV Ab: NEGATIVE

## 2013-08-31 ENCOUNTER — Other Ambulatory Visit: Payer: Self-pay | Admitting: Infectious Disease

## 2013-09-01 ENCOUNTER — Telehealth: Payer: Self-pay | Admitting: *Deleted

## 2013-09-01 NOTE — Telephone Encounter (Signed)
Jonathan Estes called and said he has been aching all over since he started taking the pravachol for over a month now. He says his muscles and joints have been aching bad and he doesn't feel like doing much because of it. He thinks the pravachol is causing it. I suggested he stop taking it for awhile and see if there was a difference, but he wants to check with Dr. Daiva EvesVan Dam first.

## 2013-09-01 NOTE — Telephone Encounter (Signed)
THAT IS OK TO TRY, IT MIGHT HELP IF HE would have a CPK checked too

## 2013-09-02 ENCOUNTER — Other Ambulatory Visit: Payer: Self-pay | Admitting: *Deleted

## 2013-09-02 ENCOUNTER — Other Ambulatory Visit: Payer: Medicare Other

## 2013-09-02 DIAGNOSIS — M791 Myalgia, unspecified site: Secondary | ICD-10-CM

## 2013-09-02 LAB — CK: CK TOTAL: 31 U/L (ref 7–232)

## 2013-09-02 NOTE — Progress Notes (Signed)
Should stop the lopid too

## 2013-09-02 NOTE — Progress Notes (Signed)
Talked with Friend today. He is going to come in and get his CPK drawn today. He said he didn't take the pravachol this morning and will stop taking it for now. He did restart the lopid this morning though.

## 2013-09-03 ENCOUNTER — Telehealth: Payer: Self-pay | Admitting: *Deleted

## 2013-09-03 NOTE — Telephone Encounter (Addendum)
Called Jonathan Estes today and he said he felt a little better today than he did. He agrees to hold off on taking any pravachol or lopid for the next few weeks and then rechallenge with the pravachol and see if he starts aching again. Discussed this with Jonathan Estes who suggested he try doing this.

## 2013-09-08 ENCOUNTER — Encounter: Payer: Self-pay | Admitting: Infectious Disease

## 2013-09-08 LAB — CD4/CD8 (T-HELPER/T-SUPPRESSOR CELL)
CD4%: 26.1
CD4: 626
CD8 T CELL SUPPRESSOR: 48.3
CD8: 1159

## 2013-09-08 LAB — HIV-1 RNA QUANT-NO REFLEX-BLD: HIV-1 RNA Viral Load: <40

## 2013-09-09 ENCOUNTER — Other Ambulatory Visit: Payer: Self-pay | Admitting: *Deleted

## 2013-09-09 DIAGNOSIS — B20 Human immunodeficiency virus [HIV] disease: Secondary | ICD-10-CM

## 2013-09-09 MED ORDER — EMTRICITABINE-TENOFOVIR DF 200-300 MG PO TABS: ORAL_TABLET | ORAL | Status: DC

## 2013-09-09 MED ORDER — RALTEGRAVIR POTASSIUM 400 MG PO TABS: ORAL_TABLET | ORAL | Status: DC

## 2013-09-09 NOTE — Telephone Encounter (Signed)
ADAP application 

## 2013-10-21 ENCOUNTER — Other Ambulatory Visit: Payer: Self-pay | Admitting: Infectious Disease

## 2013-10-22 ENCOUNTER — Other Ambulatory Visit: Payer: Self-pay | Admitting: Infectious Disease

## 2013-10-22 ENCOUNTER — Telehealth: Payer: Self-pay | Admitting: *Deleted

## 2013-10-22 DIAGNOSIS — F411 Generalized anxiety disorder: Secondary | ICD-10-CM

## 2013-10-22 NOTE — Telephone Encounter (Signed)
MD please advise about refilling this medication.  Number of tablets and # of refills.

## 2013-10-23 NOTE — Telephone Encounter (Signed)
Yes please refill with 4 refills

## 2013-10-26 NOTE — Telephone Encounter (Signed)
Dr. Daiva Eves, # of Xanax tablets?

## 2013-10-27 NOTE — Telephone Encounter (Signed)
I believe he was getting 90 so he could take up to three times daily and that is fine since it has been chronic script. We may want to move to haivng a psychiatrist prescribe this long term per our policy but I am not wanting to push this with Jonathan Estes. He has had a hard time and I odn think this is  right time to push this on him

## 2013-11-02 NOTE — Telephone Encounter (Signed)
Error

## 2013-11-04 ENCOUNTER — Ambulatory Visit: Payer: Medicare Other

## 2013-11-04 ENCOUNTER — Ambulatory Visit (INDEPENDENT_AMBULATORY_CARE_PROVIDER_SITE_OTHER): Payer: Medicare Other | Admitting: Infectious Disease

## 2013-11-04 ENCOUNTER — Ambulatory Visit (INDEPENDENT_AMBULATORY_CARE_PROVIDER_SITE_OTHER): Payer: Medicare Other | Admitting: *Deleted

## 2013-11-04 ENCOUNTER — Encounter: Payer: Self-pay | Admitting: Infectious Disease

## 2013-11-04 VITALS — BP 117/76 | HR 76 | Temp 98.3°F | Wt 195.2 lb

## 2013-11-04 VITALS — Wt 195.2 lb

## 2013-11-04 DIAGNOSIS — E119 Type 2 diabetes mellitus without complications: Secondary | ICD-10-CM

## 2013-11-04 DIAGNOSIS — Z23 Encounter for immunization: Secondary | ICD-10-CM

## 2013-11-04 DIAGNOSIS — I10 Essential (primary) hypertension: Secondary | ICD-10-CM

## 2013-11-04 DIAGNOSIS — Z006 Encounter for examination for normal comparison and control in clinical research program: Secondary | ICD-10-CM

## 2013-11-04 DIAGNOSIS — B20 Human immunodeficiency virus [HIV] disease: Secondary | ICD-10-CM

## 2013-11-04 DIAGNOSIS — I2510 Atherosclerotic heart disease of native coronary artery without angina pectoris: Secondary | ICD-10-CM

## 2013-11-04 LAB — HEMOGLOBIN A1C
HEMOGLOBIN A1C: 6 % — AB (ref <5.7)
Mean Plasma Glucose: 126 mg/dL — ABNORMAL HIGH (ref <117)

## 2013-11-04 NOTE — Progress Notes (Signed)
Jonathan Estes is here for his week 72 A5322 study visit. He denies any new problems or concerns. He is also seeing Dr. Daiva Eves today.

## 2013-11-04 NOTE — Progress Notes (Signed)
  Subjective:    Patient ID: Jonathan Estes, male    DOB: 05/26/1955, 58 y.o.   MRN: [id number]  HPI   Jonathan Estes is a 58 y.o. male who is doing superbly well on their antiviral regimen of isentress and truvada, with undetectable viral load and health cd4 count. He also does have hypothyroidism and diabetes mellitus on metformin--though last a1c was at 5.8  . He has had repeat eye exam which went very well. He has no complaints. He is happy with his regimen.  His lower back pain improved with copper containing brace dramatically.    Review of Systems  Constitutional: Negative for fever, chills, diaphoresis, activity change, appetite change, fatigue and unexpected weight change.  HENT: Negative for congestion, rhinorrhea, sinus pressure, sneezing, sore throat and trouble swallowing.   Eyes: Negative for photophobia and visual disturbance.  Respiratory: Negative for cough, chest tightness, shortness of breath, wheezing and stridor.   Cardiovascular: Negative for chest pain, palpitations and leg swelling.  Gastrointestinal: Negative for nausea, vomiting, abdominal pain, diarrhea, constipation, blood in stool, abdominal distention and anal bleeding.  Genitourinary: Negative for dysuria, hematuria, flank pain and difficulty urinating.  Musculoskeletal: Negative for arthralgias, back pain, gait problem, joint swelling and myalgias.  Skin: Negative for color change, pallor, rash and wound.  Neurological: Negative for dizziness, tremors, weakness and light-headedness.  Hematological: Negative for adenopathy. Does not bruise/bleed easily.  Psychiatric/Behavioral: Negative for behavioral problems, confusion, sleep disturbance, dysphoric mood, decreased concentration and agitation.       Objective:   Physical Exam  Constitutional: He is oriented to person, place, and time. He appears well-developed and well-nourished. No distress.  HENT:  Head: Normocephalic and atraumatic.   Mouth/Throat: Oropharynx is clear and moist. No oropharyngeal exudate.  Eyes: Conjunctivae and EOM are normal. Pupils are equal, round, and reactive to light. No scleral icterus.  Neck: Normal range of motion. Neck supple. No JVD present.  Cardiovascular: Normal rate, regular rhythm and normal heart sounds.  Exam reveals no gallop and no friction rub.   No murmur heard. Pulmonary/Chest: Effort normal and breath sounds normal. No respiratory distress. He has no wheezes. He has no rales. He exhibits no tenderness.  Abdominal: He exhibits no distension and no mass. There is no tenderness. There is no rebound and no guarding.  Musculoskeletal: He exhibits no edema and no tenderness.  Lymphadenopathy:    He has no cervical adenopathy.  Neurological: He is alert and oriented to person, place, and time. He has normal reflexes. He exhibits normal muscle tone. Coordination normal.  Skin: Skin is warm and dry. He is not diaphoretic. No erythema. No pallor.  Psychiatric: He has a normal mood and affect. His behavior is normal. Judgment and thought content normal.          Assessment & Plan:  HIV: continue current regimen, rtc in 6 months  Hypothyroidism: continue synthroid  DM: very good control, A1c pending.   HTN: welll controlled  CAD: seen on CT angio. No need for new interventions, no ssx

## 2013-11-20 ENCOUNTER — Telehealth: Payer: Self-pay | Admitting: *Deleted

## 2013-11-20 NOTE — Telephone Encounter (Signed)
Patient called stating he needs a form for DMV filled out and that Dr. Daiva Eves has done this for him previously. Dr. Daiva Eves listed as patient's PCP. He had not received the form in the mail prior to his visit on 11/04/13. He needs this by 12/13/13; and advised him to bring it in to the clinic. He will bring in 11/23/13. Jonathan Estes

## 2013-11-23 NOTE — Telephone Encounter (Signed)
Very good I had not seen the form yer,

## 2013-12-02 ENCOUNTER — Other Ambulatory Visit: Payer: Self-pay | Admitting: Infectious Disease

## 2013-12-02 DIAGNOSIS — B20 Human immunodeficiency virus [HIV] disease: Secondary | ICD-10-CM

## 2013-12-21 ENCOUNTER — Other Ambulatory Visit: Payer: Self-pay | Admitting: *Deleted

## 2013-12-21 DIAGNOSIS — M3390 Dermatopolymyositis, unspecified, organ involvement unspecified: Secondary | ICD-10-CM

## 2013-12-21 DIAGNOSIS — E1110 Type 2 diabetes mellitus with ketoacidosis without coma: Secondary | ICD-10-CM

## 2013-12-21 MED ORDER — CVS LANCETS ORIGINAL MISC: 1.0000 "application " | Freq: Every day | Status: AC

## 2013-12-21 MED ORDER — GLUCOSE BLOOD VI STRP: ORAL_STRIP | Status: AC

## 2014-01-04 ENCOUNTER — Ambulatory Visit (INDEPENDENT_AMBULATORY_CARE_PROVIDER_SITE_OTHER): Payer: Self-pay | Admitting: *Deleted

## 2014-01-04 ENCOUNTER — Encounter: Payer: Self-pay | Admitting: *Deleted

## 2014-01-04 VITALS — BP 104/72 | HR 74 | Temp 98.0°F | Resp 16 | Wt 192.0 lb

## 2014-01-04 DIAGNOSIS — Z006 Encounter for examination for normal comparison and control in clinical research program: Secondary | ICD-10-CM

## 2014-01-04 DIAGNOSIS — B20 Human immunodeficiency virus [HIV] disease: Secondary | ICD-10-CM

## 2014-01-04 LAB — COMPREHENSIVE METABOLIC PANEL
ALBUMIN: 4.9 g/dL (ref 3.5–5.2)
ALT: 11 U/L (ref 0–53)
AST: 14 U/L (ref 0–37)
Alkaline Phosphatase: 71 U/L (ref 39–117)
BUN: 16 mg/dL (ref 6–23)
CHLORIDE: 99 meq/L (ref 96–112)
CO2: 29 meq/L (ref 19–32)
CREATININE: 1.1 mg/dL (ref 0.50–1.35)
Calcium: 9.5 mg/dL (ref 8.4–10.5)
Glucose, Bld: 85 mg/dL (ref 70–99)
POTASSIUM: 4.4 meq/L (ref 3.5–5.3)
Sodium: 139 mEq/L (ref 135–145)
Total Bilirubin: 0.4 mg/dL (ref 0.2–1.2)
Total Protein: 7.5 g/dL (ref 6.0–8.3)

## 2014-01-04 LAB — HIV-1 RNA QUANT-NO REFLEX-BLD

## 2014-01-04 NOTE — Progress Notes (Signed)
Jonathan Estes is here for A5321, week 72 visit. He denies any new problems or symptoms. Questionnaire completed and fasting labs were drawn. His vitals are stable and within normal limits. He received $50 gift card for visit. Next appt is 02/18/2014 @ 9am for A5322, week 96. Jonathan HeapElisha Epperson RN

## 2014-01-19 ENCOUNTER — Encounter: Payer: Self-pay | Admitting: Infectious Disease

## 2014-01-20 ENCOUNTER — Other Ambulatory Visit: Payer: Self-pay | Admitting: *Deleted

## 2014-01-20 DIAGNOSIS — I10 Essential (primary) hypertension: Secondary | ICD-10-CM

## 2014-01-20 MED ORDER — LISINOPRIL 40 MG PO TABS: 40.0000 mg | ORAL_TABLET | Freq: Every day | ORAL | Status: DC

## 2014-02-03 ENCOUNTER — Telehealth: Payer: Self-pay

## 2014-02-03 NOTE — Telephone Encounter (Signed)
Patient called c/o received letter stating his drivers license has been revoked since form from physician was not received.  Patient states forms have been here since November, 2015.   Dr Daiva EvesVan Dam completed forms and they were on Kim Epperson's desk since the patient asked her to keep them until he picked them up.  Patient does not remember asking her to keep the forms.  [facility] today.   Forms faxed to [phone]  Case No. [id number]    Patient aware forms sent.  Confirmation received.    Laurell Josephsammy K Zykeriah Mathia, RN

## 2014-02-18 ENCOUNTER — Ambulatory Visit (INDEPENDENT_AMBULATORY_CARE_PROVIDER_SITE_OTHER): Payer: Self-pay | Admitting: *Deleted

## 2014-02-18 VITALS — BP 109/71 | HR 73 | Temp 98.0°F | Resp 16 | Ht 68.8 in | Wt 184.5 lb

## 2014-02-18 DIAGNOSIS — B20 Human immunodeficiency virus [HIV] disease: Secondary | ICD-10-CM

## 2014-02-18 DIAGNOSIS — Z006 Encounter for examination for normal comparison and control in clinical research program: Secondary | ICD-10-CM

## 2014-02-18 LAB — COMPREHENSIVE METABOLIC PANEL
ALT: 9 U/L (ref 0–53)
AST: 16 U/L (ref 0–37)
Albumin: 4.6 g/dL (ref 3.5–5.2)
Alkaline Phosphatase: 77 U/L (ref 39–117)
BILIRUBIN TOTAL: 0.4 mg/dL (ref 0.2–1.2)
BUN: 18 mg/dL (ref 6–23)
CO2: 30 meq/L (ref 19–32)
Calcium: 9.9 mg/dL (ref 8.4–10.5)
Chloride: 99 mEq/L (ref 96–112)
Creat: 1.12 mg/dL (ref 0.50–1.35)
Glucose, Bld: 84 mg/dL (ref 70–99)
Potassium: 4.6 mEq/L (ref 3.5–5.3)
SODIUM: 137 meq/L (ref 135–145)
TOTAL PROTEIN: 7.9 g/dL (ref 6.0–8.3)

## 2014-02-18 LAB — PROTEIN, URINE, RANDOM: Total Protein, Urine: 8 mg/dL (ref 5–25)

## 2014-02-18 LAB — HEPATITIS C ANTIBODY: HCV AB: NEGATIVE

## 2014-02-18 LAB — LIPID PANEL
CHOLESTEROL: 130 mg/dL (ref 0–200)
HDL: 41 mg/dL (ref >39)
LDL CALC: 46 mg/dL (ref 0–99)
Total CHOL/HDL Ratio: 3.2 Ratio
Triglycerides: 213 mg/dL — ABNORMAL HIGH (ref <150)
VLDL: 43 mg/dL — ABNORMAL HIGH (ref 0–40)

## 2014-02-18 LAB — CREATININE, URINE, RANDOM: Creatinine, Urine: 39.9 mg/dL

## 2014-02-18 LAB — HEMOGLOBIN A1C
HEMOGLOBIN A1C: 5.8 % — AB (ref <5.7)
Mean Plasma Glucose: 120 mg/dL — ABNORMAL HIGH (ref <117)

## 2014-02-18 LAB — HEPATITIS B SURFACE ANTIBODY,QUALITATIVE: HEP B S AB: NEGATIVE

## 2014-02-18 LAB — HEPATITIS B SURFACE ANTIGEN: Hepatitis B Surface Ag: NEGATIVE

## 2014-02-18 NOTE — Progress Notes (Signed)
Jonathan Estes was here for his week 96 Hailo study visit. He denies any new problems or concerns. He continues to have numbness in both feet, he graded at 8 out of 10. He will return in march for study and to see Dr. Daiva EvesVan Dam.

## 2014-03-17 ENCOUNTER — Ambulatory Visit: Payer: Self-pay

## 2014-03-23 ENCOUNTER — Other Ambulatory Visit: Payer: Self-pay | Admitting: *Deleted

## 2014-03-23 DIAGNOSIS — B20 Human immunodeficiency virus [HIV] disease: Secondary | ICD-10-CM

## 2014-03-23 MED ORDER — RALTEGRAVIR POTASSIUM 400 MG PO TABS: 400.0000 mg | ORAL_TABLET | Freq: Two times a day (BID) | ORAL | Status: DC

## 2014-03-23 MED ORDER — EMTRICITABINE-TENOFOVIR DF 200-300 MG PO TABS: 1.0000 | ORAL_TABLET | Freq: Every day | ORAL | Status: DC

## 2014-03-23 NOTE — Telephone Encounter (Signed)
ADAP Application 

## 2014-03-29 ENCOUNTER — Encounter: Payer: Self-pay | Admitting: Infectious Disease

## 2014-03-29 LAB — CD4/CD8 (T-HELPER/T-SUPPRESSOR CELL)
CD4%: 26.4
CD4: 713
CD8 T CELL SUPPRESSOR: 49.3
CD8: 1331

## 2014-04-27 ENCOUNTER — Other Ambulatory Visit: Payer: Self-pay | Admitting: Licensed Clinical Social Worker

## 2014-04-27 DIAGNOSIS — F411 Generalized anxiety disorder: Secondary | ICD-10-CM

## 2014-04-27 MED ORDER — ALPRAZOLAM 0.5 MG PO TABS: ORAL_TABLET | ORAL | Status: DC

## 2014-05-05 ENCOUNTER — Other Ambulatory Visit: Payer: Self-pay | Admitting: Internal Medicine

## 2014-05-24 ENCOUNTER — Ambulatory Visit: Payer: Medicare Other | Admitting: Infectious Disease

## 2014-05-25 ENCOUNTER — Ambulatory Visit: Payer: Self-pay | Admitting: Pharmacist Clinician (PhC)/ Clinical Pharmacy Specialist

## 2014-05-25 ENCOUNTER — Ambulatory Visit (INDEPENDENT_AMBULATORY_CARE_PROVIDER_SITE_OTHER): Payer: Self-pay | Admitting: *Deleted

## 2014-05-25 VITALS — BP 113/76 | HR 62 | Temp 97.5°F | Resp 17 | Wt 192.5 lb

## 2014-05-25 DIAGNOSIS — Z006 Encounter for examination for normal comparison and control in clinical research program: Secondary | ICD-10-CM

## 2014-05-25 LAB — COMPREHENSIVE METABOLIC PANEL
ALK PHOS: 77 U/L (ref 39–117)
ALT: 9 U/L (ref 0–53)
AST: 16 U/L (ref 0–37)
Albumin: 5.1 g/dL (ref 3.5–5.2)
BILIRUBIN TOTAL: 0.4 mg/dL (ref 0.2–1.2)
BUN: 14 mg/dL (ref 6–23)
CALCIUM: 9.5 mg/dL (ref 8.4–10.5)
CO2: 30 mEq/L (ref 19–32)
Chloride: 99 mEq/L (ref 96–112)
Creat: 1.08 mg/dL (ref 0.50–1.35)
Glucose, Bld: 84 mg/dL (ref 70–99)
POTASSIUM: 3.8 meq/L (ref 3.5–5.3)
Sodium: 135 mEq/L (ref 135–145)
Total Protein: 8 g/dL (ref 6.0–8.3)

## 2014-05-25 LAB — HIV-1 RNA QUANT-NO REFLEX-BLD: HIV-1 RNA Viral Load: <40

## 2014-05-25 LAB — CD4/CD8 (T-HELPER/T-SUPPRESSOR CELL)
CD4%: 26.8
CD4: 670
CD8 % Suppressor T Cell: 50.8
CD8: 1270

## 2014-05-25 LAB — HEPATITIS C ANTIBODY: HCV Ab: NEGATIVE

## 2014-05-25 NOTE — Progress Notes (Signed)
Jonathan Estes is here for A5321, week 96. He denies any new problems. Continues to have lower back aches. Medications have not changed. Vital signs are stable and fasting blood drawn. Hair sample collected. He received $50 gift card for visit. Next appointment is Jul 12, 2014 @ 9am and will see Dr. Daiva EvesVan Dam that day as well. Tacey HeapElisha Cayle Cordoba RN

## 2014-06-02 ENCOUNTER — Other Ambulatory Visit: Payer: Self-pay | Admitting: Infectious Disease

## 2014-06-02 DIAGNOSIS — B20 Human immunodeficiency virus [HIV] disease: Secondary | ICD-10-CM

## 2014-06-21 ENCOUNTER — Encounter: Payer: Self-pay | Admitting: Infectious Disease

## 2014-07-12 ENCOUNTER — Encounter: Payer: Self-pay | Admitting: *Deleted

## 2014-07-12 ENCOUNTER — Ambulatory Visit (INDEPENDENT_AMBULATORY_CARE_PROVIDER_SITE_OTHER): Payer: Medicare Other | Admitting: Infectious Disease

## 2014-07-12 ENCOUNTER — Encounter: Payer: Self-pay | Admitting: Infectious Disease

## 2014-07-12 VITALS — BP 101/68 | HR 73 | Temp 97.4°F | Wt 194.0 lb

## 2014-07-12 DIAGNOSIS — I2510 Atherosclerotic heart disease of native coronary artery without angina pectoris: Secondary | ICD-10-CM

## 2014-07-12 DIAGNOSIS — E119 Type 2 diabetes mellitus without complications: Secondary | ICD-10-CM

## 2014-07-12 DIAGNOSIS — B20 Human immunodeficiency virus [HIV] disease: Secondary | ICD-10-CM | POA: Diagnosis not present

## 2014-07-12 DIAGNOSIS — I10 Essential (primary) hypertension: Secondary | ICD-10-CM | POA: Diagnosis not present

## 2014-07-12 DIAGNOSIS — F419 Anxiety disorder, unspecified: Secondary | ICD-10-CM

## 2014-07-12 HISTORY — DX: Type 2 diabetes mellitus without complications: E11.9

## 2014-07-12 HISTORY — DX: Human immunodeficiency virus (HIV) disease: B20

## 2014-07-12 LAB — HEMOGLOBIN A1C
Hgb A1c MFr Bld: 5.6 % (ref <5.7)
MEAN PLASMA GLUCOSE: 114 mg/dL (ref <117)

## 2014-07-12 LAB — TSH: TSH: 1.972 u[IU]/mL (ref 0.350–4.500)

## 2014-07-12 NOTE — Progress Notes (Signed)
  Subjective:    Patient ID: Jonathan Estes, male    DOB: 21-Jun-1955, 59 y.o.   MRN: [id number]  HPI   Jonathan Estes is a 59 y.o. male who is doing superbly well on their antiviral regimen of isentress and truvada, with undetectable viral load and health cd4 count. He also does have hypothyroidism and diabetes mellitus on metformin--though last a1c was at 5.8  He has regained weight since we last checked his A1c.     Review of Systems  Constitutional: Negative for fever, chills, diaphoresis, activity change, appetite change, fatigue and unexpected weight change.  HENT: Negative for congestion, rhinorrhea, sinus pressure, sneezing, sore throat and trouble swallowing.   Eyes: Negative for photophobia and visual disturbance.  Respiratory: Negative for cough, chest tightness, shortness of breath, wheezing and stridor.   Cardiovascular: Negative for chest pain, palpitations and leg swelling.  Gastrointestinal: Negative for nausea, vomiting, abdominal pain, diarrhea, constipation, blood in stool, abdominal distention and anal bleeding.  Genitourinary: Negative for dysuria, hematuria, flank pain and difficulty urinating.  Musculoskeletal: Negative for myalgias, back pain, joint swelling, arthralgias and gait problem.  Skin: Negative for color change, pallor, rash and wound.  Neurological: Negative for dizziness, tremors, weakness and light-headedness.  Hematological: Negative for adenopathy. Does not bruise/bleed easily.  Psychiatric/Behavioral: Negative for behavioral problems, confusion, sleep disturbance, dysphoric mood, decreased concentration and agitation.       Objective:   Physical Exam  Constitutional: He is oriented to person, place, and time. He appears well-developed and well-nourished. No distress.  HENT:  Head: Normocephalic and atraumatic.  Mouth/Throat: Oropharynx is clear and moist. No oropharyngeal exudate.  Eyes: Conjunctivae and EOM are normal. Pupils are equal,  round, and reactive to light. No scleral icterus.  Neck: Normal range of motion. Neck supple. No JVD present.  Cardiovascular: Normal rate, regular rhythm and normal heart sounds.  Exam reveals no gallop and no friction rub.   No murmur heard. Pulmonary/Chest: Effort normal and breath sounds normal. No respiratory distress. He has no wheezes. He has no rales. He exhibits no tenderness.  Abdominal: He exhibits no distension and no mass. There is no tenderness. There is no rebound and no guarding.  Musculoskeletal: He exhibits no edema or tenderness.  Lymphadenopathy:    He has no cervical adenopathy.  Neurological: He is alert and oriented to person, place, and time. He has normal reflexes. He exhibits normal muscle tone. Coordination normal.  Skin: Skin is warm and dry. He is not diaphoretic. No erythema. No pallor.  Psychiatric: He has a normal mood and affect. His behavior is normal. Judgment and thought content normal.          Assessment & Plan:  HIV: change to Isentress BID and once daily DESCOVY, rtc in 6 months. I spent greater than 25 minutes with the patient including greater than 50% of time in face to face counsel of the patient re his new HIV regimen and treatment of his DM, thyroid disease, CAD and in coordination of their care.   Hypothyroidism: continue synthroid, check TSH today  DM: very good control, A1c repeat pending from today  HTN: welll controlled  CAD: seen on CT angio. No need for new interventions, no ssx, on statin, Asa  Hyperlipidemia: on statin  Anxiety , generalized: continue prn xanax, needs to sign contract

## 2014-08-02 ENCOUNTER — Other Ambulatory Visit: Payer: Self-pay | Admitting: Infectious Disease

## 2014-08-04 ENCOUNTER — Other Ambulatory Visit: Payer: Self-pay | Admitting: Infectious Disease

## 2014-08-27 ENCOUNTER — Other Ambulatory Visit: Payer: Self-pay | Admitting: Infectious Disease

## 2014-09-03 ENCOUNTER — Other Ambulatory Visit: Payer: Self-pay | Admitting: Internal Medicine

## 2014-09-03 DIAGNOSIS — E119 Type 2 diabetes mellitus without complications: Secondary | ICD-10-CM

## 2014-09-15 ENCOUNTER — Other Ambulatory Visit: Payer: Self-pay | Admitting: Infectious Disease

## 2014-10-02 ENCOUNTER — Other Ambulatory Visit: Payer: Self-pay | Admitting: Internal Medicine

## 2014-11-10 ENCOUNTER — Other Ambulatory Visit: Payer: Self-pay | Admitting: Infectious Disease

## 2014-11-10 ENCOUNTER — Encounter (INDEPENDENT_AMBULATORY_CARE_PROVIDER_SITE_OTHER): Payer: Self-pay | Admitting: *Deleted

## 2014-11-10 ENCOUNTER — Other Ambulatory Visit: Payer: Self-pay | Admitting: *Deleted

## 2014-11-10 VITALS — BP 97/62 | HR 66 | Temp 98.1°F | Resp 16 | Wt 195.0 lb

## 2014-11-10 DIAGNOSIS — Z006 Encounter for examination for normal comparison and control in clinical research program: Secondary | ICD-10-CM

## 2014-11-10 DIAGNOSIS — B20 Human immunodeficiency virus [HIV] disease: Secondary | ICD-10-CM

## 2014-11-10 DIAGNOSIS — Z21 Asymptomatic human immunodeficiency virus [HIV] infection status: Secondary | ICD-10-CM

## 2014-11-10 LAB — COMPREHENSIVE METABOLIC PANEL
ALBUMIN: 4.4 g/dL (ref 3.6–5.1)
ALK PHOS: 61 U/L (ref 40–115)
ALT: 9 U/L (ref 9–46)
AST: 14 U/L (ref 10–35)
BUN: 20 mg/dL (ref 7–25)
CO2: 28 mmol/L (ref 20–31)
Calcium: 9.6 mg/dL (ref 8.6–10.3)
Chloride: 102 mmol/L (ref 98–110)
Creat: 1.18 mg/dL (ref 0.70–1.33)
Glucose, Bld: 90 mg/dL (ref 65–99)
POTASSIUM: 4.4 mmol/L (ref 3.5–5.3)
Sodium: 137 mmol/L (ref 135–146)
TOTAL PROTEIN: 7.2 g/dL (ref 6.1–8.1)
Total Bilirubin: 0.3 mg/dL (ref 0.2–1.2)

## 2014-11-10 MED ORDER — EMTRICITABINE-TENOFOVIR AF 200-25 MG PO TABS: 1.0000 | ORAL_TABLET | Freq: Every day | ORAL | Status: DC

## 2014-11-10 MED ORDER — RALTEGRAVIR POTASSIUM 400 MG PO TABS: 400.0000 mg | ORAL_TABLET | Freq: Two times a day (BID) | ORAL | Status: DC

## 2014-11-10 NOTE — Progress Notes (Signed)
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 We discussed the option to enroll on the substudy, A5341 but he declined due to having to go to [facility] for procedures. He denies any new problems or concerns. He switched his Truvada to Descovy in June. He will return in November for the A5322 study visit.

## 2014-12-15 ENCOUNTER — Encounter: Payer: Self-pay | Admitting: Infectious Disease

## 2014-12-15 LAB — HIV-1 RNA QUANT-NO REFLEX-BLD

## 2015-01-10 ENCOUNTER — Other Ambulatory Visit: Payer: Self-pay | Admitting: *Deleted

## 2015-01-10 ENCOUNTER — Other Ambulatory Visit: Payer: Self-pay | Admitting: Infectious Disease

## 2015-01-10 MED ORDER — LISINOPRIL 40 MG PO TABS: 40.0000 mg | ORAL_TABLET | Freq: Every day | ORAL | Status: DC

## 2015-01-12 ENCOUNTER — Ambulatory Visit (INDEPENDENT_AMBULATORY_CARE_PROVIDER_SITE_OTHER): Payer: Medicare Other | Admitting: Infectious Disease

## 2015-01-12 ENCOUNTER — Other Ambulatory Visit (HOSPITAL_COMMUNITY)
Admission: RE | Admit: 2015-01-12 | Discharge: 2015-01-12 | Disposition: A | Discharge status: Home / Self Care | Payer: Medicare Other | Source: Ambulatory Visit | Attending: Infectious Disease | Admitting: Infectious Disease

## 2015-01-12 ENCOUNTER — Encounter: Payer: Self-pay | Admitting: Infectious Disease

## 2015-01-12 ENCOUNTER — Encounter (INDEPENDENT_AMBULATORY_CARE_PROVIDER_SITE_OTHER): Payer: Self-pay | Admitting: *Deleted

## 2015-01-12 VITALS — BP 107/71 | HR 74 | Temp 98.3°F | Wt 195.0 lb

## 2015-01-12 VITALS — Temp 98.3°F | Wt 195.0 lb

## 2015-01-12 DIAGNOSIS — E039 Hypothyroidism, unspecified: Secondary | ICD-10-CM

## 2015-01-12 DIAGNOSIS — I10 Essential (primary) hypertension: Secondary | ICD-10-CM | POA: Diagnosis not present

## 2015-01-12 DIAGNOSIS — Z006 Encounter for examination for normal comparison and control in clinical research program: Secondary | ICD-10-CM

## 2015-01-12 DIAGNOSIS — B20 Human immunodeficiency virus [HIV] disease: Secondary | ICD-10-CM

## 2015-01-12 DIAGNOSIS — E119 Type 2 diabetes mellitus without complications: Secondary | ICD-10-CM | POA: Diagnosis not present

## 2015-01-12 DIAGNOSIS — Z113 Encounter for screening for infections with a predominantly sexual mode of transmission: Secondary | ICD-10-CM | POA: Diagnosis present

## 2015-01-12 DIAGNOSIS — I2510 Atherosclerotic heart disease of native coronary artery without angina pectoris: Secondary | ICD-10-CM

## 2015-01-12 HISTORY — DX: Type 2 diabetes mellitus without complications: E11.9

## 2015-01-12 HISTORY — DX: Atherosclerotic heart disease of native coronary artery without angina pectoris: I25.10

## 2015-01-12 LAB — HEMOGLOBIN A1C
HEMOGLOBIN A1C: 5.7 % — AB (ref <5.7)
MEAN PLASMA GLUCOSE: 117 mg/dL — AB (ref <117)

## 2015-01-12 LAB — COMPREHENSIVE METABOLIC PANEL
ALK PHOS: 76 U/L (ref 40–115)
ALT: 11 U/L (ref 9–46)
AST: 14 U/L (ref 10–35)
Albumin: 4.6 g/dL (ref 3.6–5.1)
BILIRUBIN TOTAL: 0.3 mg/dL (ref 0.2–1.2)
BUN: 19 mg/dL (ref 7–25)
CALCIUM: 9.4 mg/dL (ref 8.6–10.3)
CO2: 27 mmol/L (ref 20–31)
CREATININE: 1.11 mg/dL (ref 0.70–1.33)
Chloride: 102 mmol/L (ref 98–110)
GLUCOSE: 91 mg/dL (ref 65–99)
Potassium: 4.7 mmol/L (ref 3.5–5.3)
SODIUM: 139 mmol/L (ref 135–146)
Total Protein: 7.8 g/dL (ref 6.1–8.1)

## 2015-01-12 LAB — LIPID PANEL
Cholesterol: 146 mg/dL (ref 125–200)
HDL: 39 mg/dL — ABNORMAL LOW (ref >=40)
LDL CALC: 60 mg/dL (ref <130)
Total CHOL/HDL Ratio: 3.7 Ratio (ref <=5.0)
Triglycerides: 235 mg/dL — ABNORMAL HIGH (ref <150)
VLDL: 47 mg/dL — ABNORMAL HIGH (ref <30)

## 2015-01-12 LAB — CD4/CD8 (T-HELPER/T-SUPPRESSOR CELL)
CD4%: 26.7
CD4: 641
CD8 T CELL SUPPRESSOR: 49.9
CD8: 1198

## 2015-01-12 LAB — HIV-1 RNA QUANT-NO REFLEX-BLD

## 2015-01-12 NOTE — Progress Notes (Signed)
"chief complaint: follow-up for HIV, DM, and hypothyroidism Subjective:    Patient ID: Jonathan Estes, male    DOB: [birth year], 57 y.o.   MRN: [id number]  HPI   Jonathan Estes is a 59 y.o. male who is doing superbly well on their antiviral regimen of isentress and truvada, with undetectable viral load and health cd4 count.    He has comorbid DM controlled and hypothyroidism on synthroid at with normal TSH.  Past Medical History  Diagnosis Date  . Pneumocystis carinii pneumonia (HCC)   . Human immunodeficiency virus (HIV) disease (HCC)   . HTN (hypertension)   . Diabetes mellitus   . Hypothyroid   . Hyperlipidemia   . Bilateral bunions     left foot worse  . Anxiety   . AIDS (HCC) 07/12/2014  . Diabetes type 2, controlled (HCC) 07/12/2014    Past Surgical History  Procedure Laterality Date  . None    . Lung biopsy      Family History  Problem Relation Age of Onset  . Cancer Father     Colon  . Colon cancer Father       Social History   Social History  . Marital Status: Single    Spouse Name: N/A  . Number of Children: N/A  . Years of Education: N/A   Social History Main Topics  . Smoking status: Never Smoker   . Smokeless tobacco: Never Used  . Alcohol Use: 0.5 oz/week    1 drink(s) per week  . Drug Use: 5.00 per week    Special: Marijuana     Comment: Smokes marijuana daily!  . Sexual Activity:    Partners: Female   Other Topics Concern  . None   Social History Narrative   Lives alone.          Allergies  Allergen Reactions  . Penicillins   . Sulfamethoxazole-Trimethoprim     REACTION: severe rash     Current outpatient prescriptions:  .  ALPRAZolam (XANAX) 0.5 MG tablet, TAKE ONE TABLET 3 TIMES DAILY AS NEEDED FOR ANXIETY AND SLEEP, Disp: 90 tablet, Rfl: 3 .  cetirizine (ZYRTEC) 10 MG tablet, TAKE 1 TABLET (10 MG TOTAL) BY MOUTH DAILY., Disp: 90 tablet, Rfl: 2 .  [employer] LANCETS ORIGINAL MISC, 1 application by Does not apply route daily.,  Disp: 90 each, Rfl: 4 .  emtricitabine-tenofovir AF (DESCOVY) 200-25 MG per tablet, Take 1 tablet by mouth daily., Disp: 90 tablet, Rfl: 3 .  glucose blood (ACCU-CHEK AVIVA) test strip, Use as instructed, Disp: 100 each, Rfl: 12 .  hydrochlorothiazide (MICROZIDE) 12.5 MG capsule, TAKE ONE CAPSULE BY MOUTH EVERY DAY, Disp: 90 capsule, Rfl: 3 .  levothyroxine (SYNTHROID, LEVOTHROID) 25 MCG tablet, TAKE 1 TABLET BY MOUTH EVERY DAY ON AN EMPTY STOMACH, Disp: 90 tablet, Rfl: 3 .  lisinopril (PRINIVIL,ZESTRIL) 40 MG tablet, Take 1 tablet (40 mg total) by mouth daily., Disp: 90 tablet, Rfl: 3 .  metFORMIN (GLUCOPHAGE) 1000 MG tablet, TAKE 1 TABLET (1,000 MG TOTAL) BY MOUTH 2 (TWO) TIMES DAILY. WITH BREAKFAST AND DINNER., Disp: 60 tablet, Rfl: 5 .  pravastatin (PRAVACHOL) 20 MG tablet, TAKE 1 TABLET (20 MG TOTAL) BY MOUTH DAILY., Disp: 90 tablet, Rfl: 3 .  raltegravir (ISENTRESS) 400 MG tablet, Take 1 tablet (400 mg total) by mouth 2 (two) times daily., Disp: 180 tablet, Rfl: 3    Review of Systems  Constitutional: Negative for fever, chills, diaphoresis, activity change, appetite change, fatigue and unexpected weight  change.  HENT: Negative for congestion, rhinorrhea, sinus pressure, sneezing, sore throat and trouble swallowing.   Eyes: Negative for photophobia and visual disturbance.  Respiratory: Negative for cough, chest tightness, shortness of breath, wheezing and stridor.   Cardiovascular: Negative for chest pain, palpitations and leg swelling.  Gastrointestinal: Negative for nausea, vomiting, abdominal pain, diarrhea, constipation, blood in stool, abdominal distention and anal bleeding.  Genitourinary: Negative for dysuria, hematuria, flank pain and difficulty urinating.  Musculoskeletal: Negative for myalgias, back pain, joint swelling, arthralgias and gait problem.  Skin: Negative for color change, pallor, rash and wound.  Neurological: Negative for dizziness, tremors, weakness and  light-headedness.  Hematological: Negative for adenopathy. Does not bruise/bleed easily.  Psychiatric/Behavioral: Negative for behavioral problems, confusion, sleep disturbance, dysphoric mood, decreased concentration and agitation.       Objective:   Physical Exam  Constitutional: He is oriented to person, place, and time. He appears well-developed and well-nourished. No distress.  HENT:  Head: Normocephalic and atraumatic.  Mouth/Throat: Oropharynx is clear and moist. No oropharyngeal exudate.  Eyes: Conjunctivae and EOM are normal. Pupils are equal, round, and reactive to light. No scleral icterus.  Neck: Normal range of motion. Neck supple. No JVD present.  Cardiovascular: Normal rate, regular rhythm and normal heart sounds.  Exam reveals no gallop and no friction rub.   No murmur heard. Pulmonary/Chest: Effort normal and breath sounds normal. No respiratory distress. He has no wheezes. He has no rales. He exhibits no tenderness.  Abdominal: He exhibits no distension and no mass. There is no tenderness. There is no rebound and no guarding.  Musculoskeletal: He exhibits no edema or tenderness.  Lymphadenopathy:    He has no cervical adenopathy.  Neurological: He is alert and oriented to person, place, and time. He has normal reflexes. He exhibits normal muscle tone. Coordination normal.  Skin: Skin is warm and dry. He is not diaphoretic. No erythema. No pallor.  Psychiatric: He has a normal mood and affect. His behavior is normal. Judgment and thought content normal.          Assessment & Plan:  HIV: cotninue Isentress BID and once daily DESCOVY rtc in1 year  Hypothyroidism: continue synthroid, recheck TSH regularly  DM: very good control, A1c again at next visit  HTN: welll controlled  CAD: seen on CT angio. No need for new interventions, no ssx, on statin, Asa  Hyperlipidemia: on statin  Anxiety , generalized: continue prn xanax  I spent greater than 40 minutes with  the patient including greater than 50% of time in face to face counsel of the patient re his HIV, his DM, hypothyroidism, CAD and in coordination of his care.

## 2015-01-13 LAB — RPR

## 2015-01-13 LAB — URINE CYTOLOGY ANCILLARY ONLY
Chlamydia: NEGATIVE
Neisseria Gonorrhea: NEGATIVE

## 2015-01-13 LAB — MICROALBUMIN / CREATININE URINE RATIO
CREATININE, URINE: 90 mg/dL (ref 20–370)
MICROALB UR: 1 mg/dL
MICROALB/CREAT RATIO: 11 ug/mg{creat} (ref <30)

## 2015-01-13 LAB — CREATININE, URINE, RANDOM: CREATININE, URINE: 90 mg/dL (ref 20–370)

## 2015-01-13 LAB — PROTEIN, URINE, RANDOM: TOTAL PROTEIN, URINE: 21 mg/dL (ref 5–25)

## 2015-01-14 ENCOUNTER — Encounter: Payer: Self-pay | Admitting: *Deleted

## 2015-01-14 NOTE — Progress Notes (Signed)
Jonathan Estes is here for A5322, week 144. No new issues. Switched from Truvada to Descovy in June. Neuro and questionnaires completed. He received $50 gift card and next appt is Tuesday, June 07, 2015 @ 9am. Tacey HeapElisha Epperson RN

## 2015-01-20 ENCOUNTER — Encounter: Payer: Self-pay | Admitting: Infectious Disease

## 2015-02-02 ENCOUNTER — Encounter: Payer: Self-pay | Admitting: Infectious Disease

## 2015-02-09 ENCOUNTER — Other Ambulatory Visit: Payer: Self-pay | Admitting: Infectious Disease

## 2015-03-02 ENCOUNTER — Other Ambulatory Visit: Payer: Self-pay | Admitting: Infectious Disease

## 2015-03-10 ENCOUNTER — Ambulatory Visit: Payer: Self-pay | Admitting: Infectious Disease

## 2015-05-02 ENCOUNTER — Other Ambulatory Visit: Payer: Self-pay | Admitting: Infectious Disease

## 2015-05-04 ENCOUNTER — Other Ambulatory Visit: Payer: Self-pay | Admitting: *Deleted

## 2015-05-04 DIAGNOSIS — F419 Anxiety disorder, unspecified: Secondary | ICD-10-CM

## 2015-05-04 MED ORDER — ALPRAZOLAM 0.5 MG PO TABS: ORAL_TABLET | ORAL | Status: DC

## 2015-05-05 ENCOUNTER — Other Ambulatory Visit: Payer: Self-pay | Admitting: *Deleted

## 2015-05-05 DIAGNOSIS — Z006 Encounter for examination for normal comparison and control in clinical research program: Secondary | ICD-10-CM

## 2015-05-05 DIAGNOSIS — B20 Human immunodeficiency virus [HIV] disease: Secondary | ICD-10-CM

## 2015-05-05 DIAGNOSIS — Z21 Asymptomatic human immunodeficiency virus [HIV] infection status: Secondary | ICD-10-CM

## 2015-05-05 MED ORDER — EMTRICITABINE-TENOFOVIR AF 200-25 MG PO TABS: 1.0000 | ORAL_TABLET | Freq: Every day | ORAL | Status: DC

## 2015-05-05 MED ORDER — RALTEGRAVIR POTASSIUM 400 MG PO TABS: 400.0000 mg | ORAL_TABLET | Freq: Two times a day (BID) | ORAL | Status: DC

## 2015-06-07 ENCOUNTER — Encounter (INDEPENDENT_AMBULATORY_CARE_PROVIDER_SITE_OTHER): Payer: Medicare Other | Admitting: *Deleted

## 2015-06-07 VITALS — BP 107/74 | HR 63 | Temp 98.0°F | Resp 16 | Wt 201.0 lb

## 2015-06-07 DIAGNOSIS — Z006 Encounter for examination for normal comparison and control in clinical research program: Secondary | ICD-10-CM

## 2015-06-07 LAB — COMPREHENSIVE METABOLIC PANEL
ALK PHOS: 58 U/L (ref 40–115)
ALT: 12 U/L (ref 9–46)
AST: 15 U/L (ref 10–35)
Albumin: 4.4 g/dL (ref 3.6–5.1)
BILIRUBIN TOTAL: 0.5 mg/dL (ref 0.2–1.2)
BUN: 15 mg/dL (ref 7–25)
CALCIUM: 9.1 mg/dL (ref 8.6–10.3)
CO2: 26 mmol/L (ref 20–31)
Chloride: 103 mmol/L (ref 98–110)
Creat: 0.92 mg/dL (ref 0.70–1.33)
GLUCOSE: 92 mg/dL (ref 65–99)
POTASSIUM: 4.2 mmol/L (ref 3.5–5.3)
Sodium: 139 mmol/L (ref 135–146)
TOTAL PROTEIN: 7.2 g/dL (ref 6.1–8.1)

## 2015-06-09 NOTE — Progress Notes (Signed)
Jonathan Estes is here for [phone] and [phone]. Assessment unchanged since last study visit. Questionnaires completed. Blood obtained. Vitals stable. He received $100 gift card for study. Next appointment scheduled in September. Tacey HeapElisha Epperson RN

## 2015-07-15 ENCOUNTER — Other Ambulatory Visit: Payer: Self-pay | Admitting: Infectious Disease

## 2015-07-18 ENCOUNTER — Telehealth: Payer: Self-pay | Admitting: *Deleted

## 2015-07-18 DIAGNOSIS — F419 Anxiety disorder, unspecified: Secondary | ICD-10-CM

## 2015-07-18 NOTE — Telephone Encounter (Signed)
Dr. Daiva EvesVan Dam, pt is requesting refill for Xanax.  OK to refill and how many?

## 2015-07-18 NOTE — Telephone Encounter (Signed)
OK to refill and with 4 refills and however mahy tablets were on the last script

## 2015-07-19 MED ORDER — ALPRAZOLAM 0.5 MG PO TABS: ORAL_TABLET | ORAL | Status: DC

## 2015-07-19 NOTE — Addendum Note (Signed)
Addended by: Jennet MaduroESTRIDGE, Venisha Boehning D on: 07/19/2015 10:59 AM   Modules accepted: Orders

## 2015-07-20 ENCOUNTER — Encounter: Payer: Self-pay | Admitting: Infectious Disease

## 2015-07-20 LAB — HIV-1 RNA QUANT-NO REFLEX-BLD

## 2015-07-25 ENCOUNTER — Encounter: Payer: Self-pay | Admitting: Infectious Disease

## 2015-07-29 ENCOUNTER — Other Ambulatory Visit: Payer: Self-pay | Admitting: Infectious Disease

## 2015-08-01 ENCOUNTER — Other Ambulatory Visit: Payer: Self-pay | Admitting: Infectious Disease

## 2015-08-01 DIAGNOSIS — E119 Type 2 diabetes mellitus without complications: Secondary | ICD-10-CM

## 2015-08-03 ENCOUNTER — Other Ambulatory Visit: Payer: Self-pay | Admitting: *Deleted

## 2015-08-03 DIAGNOSIS — E785 Hyperlipidemia, unspecified: Secondary | ICD-10-CM

## 2015-08-03 MED ORDER — PRAVASTATIN SODIUM 20 MG PO TABS: ORAL_TABLET | ORAL | Status: DC

## 2015-08-15 ENCOUNTER — Other Ambulatory Visit: Payer: Self-pay | Admitting: Infectious Disease

## 2015-08-23 ENCOUNTER — Other Ambulatory Visit: Payer: Self-pay | Admitting: *Deleted

## 2015-08-23 DIAGNOSIS — E119 Type 2 diabetes mellitus without complications: Secondary | ICD-10-CM

## 2015-08-23 MED ORDER — METFORMIN HCL 1000 MG PO TABS: 1000.0000 mg | ORAL_TABLET | Freq: Two times a day (BID) | ORAL | Status: DC

## 2015-12-01 ENCOUNTER — Encounter (INDEPENDENT_AMBULATORY_CARE_PROVIDER_SITE_OTHER): Payer: Medicare Other | Admitting: *Deleted

## 2015-12-01 VITALS — BP 106/73 | HR 76 | Temp 98.1°F | Resp 16 | Ht 68.6 in | Wt 197.5 lb

## 2015-12-01 DIAGNOSIS — Z299 Encounter for prophylactic measures, unspecified: Secondary | ICD-10-CM

## 2015-12-01 DIAGNOSIS — Z006 Encounter for examination for normal comparison and control in clinical research program: Secondary | ICD-10-CM

## 2015-12-01 DIAGNOSIS — Z23 Encounter for immunization: Secondary | ICD-10-CM

## 2015-12-01 LAB — COMPREHENSIVE METABOLIC PANEL
ALBUMIN: 4.4 g/dL (ref 3.6–5.1)
ALK PHOS: 71 U/L (ref 40–115)
ALT: 9 U/L (ref 9–46)
AST: 15 U/L (ref 10–35)
BUN: 15 mg/dL (ref 7–25)
CHLORIDE: 102 mmol/L (ref 98–110)
CO2: 23 mmol/L (ref 20–31)
Calcium: 9.4 mg/dL (ref 8.6–10.3)
Creat: 1.17 mg/dL (ref 0.70–1.33)
Glucose, Bld: 89 mg/dL (ref 65–99)
POTASSIUM: 4 mmol/L (ref 3.5–5.3)
Sodium: 137 mmol/L (ref 135–146)
TOTAL PROTEIN: 7.3 g/dL (ref 6.1–8.1)
Total Bilirubin: 0.4 mg/dL (ref 0.2–1.2)

## 2015-12-01 LAB — LIPID PANEL
CHOL/HDL RATIO: 3.6 ratio (ref <=5.0)
CHOLESTEROL: 128 mg/dL (ref 125–200)
HDL: 36 mg/dL — ABNORMAL LOW (ref >=40)
LDL Cholesterol: 31 mg/dL (ref <130)
TRIGLYCERIDES: 307 mg/dL — AB (ref <150)
VLDL: 61 mg/dL — AB (ref <30)

## 2015-12-01 NOTE — Progress Notes (Signed)
Jonathan Estes is here for his week 192 visit for A5322 and week 168 for A5321. He denies any new problems or concerns. 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 His Hep B immunity response to the last vaccines did not take, so I started a new series of the Hep B vax as weel as give him his flushot today. He will see Dr. Daiva EvesVan Dam in 1 month and then come back for research again in 6 months.

## 2015-12-02 LAB — HEMOGLOBIN A1C
Hgb A1c MFr Bld: 5.5 % (ref <5.7)
Mean Plasma Glucose: 111 mg/dL

## 2015-12-02 LAB — CREATININE, URINE, RANDOM: Creatinine, Urine: 157 mg/dL (ref 20–370)

## 2015-12-02 LAB — HEPATITIS B SURFACE ANTIBODY,QUALITATIVE: Hep B S Ab: NEGATIVE

## 2015-12-02 LAB — PROTEIN, URINE, RANDOM: Total Protein, Urine: 21 mg/dL (ref 5–25)

## 2015-12-02 LAB — HEPATITIS B SURFACE ANTIGEN: Hepatitis B Surface Ag: NEGATIVE

## 2015-12-02 LAB — HEPATITIS C ANTIBODY: HCV AB: NEGATIVE

## 2015-12-13 ENCOUNTER — Other Ambulatory Visit: Payer: Self-pay | Admitting: Infectious Disease

## 2015-12-13 DIAGNOSIS — F419 Anxiety disorder, unspecified: Secondary | ICD-10-CM

## 2015-12-16 ENCOUNTER — Other Ambulatory Visit: Payer: Self-pay | Admitting: Infectious Disease

## 2015-12-16 DIAGNOSIS — F419 Anxiety disorder, unspecified: Secondary | ICD-10-CM

## 2015-12-19 ENCOUNTER — Encounter: Payer: Self-pay | Admitting: *Deleted

## 2015-12-19 LAB — HIV-1 RNA QUANT-NO REFLEX-BLD

## 2015-12-30 ENCOUNTER — Encounter: Payer: Self-pay | Admitting: *Deleted

## 2015-12-30 LAB — HIV-1 RNA QUANT-NO REFLEX-BLD: HIV 1 RNA Quant: <40

## 2015-12-30 LAB — CD4/CD8 (T-HELPER/T-SUPPRESSOR CELL)
CD4 absolute: 772
CD4%: 28.6
CD8 % Suppressor T Cell: 49.6
CD8 T CELL ABS: 1339

## 2016-01-04 ENCOUNTER — Other Ambulatory Visit: Payer: Self-pay | Admitting: Infectious Disease

## 2016-01-04 ENCOUNTER — Encounter: Payer: Self-pay | Admitting: Infectious Disease

## 2016-01-04 ENCOUNTER — Ambulatory Visit: Payer: Medicare Other | Admitting: *Deleted

## 2016-01-04 ENCOUNTER — Ambulatory Visit (INDEPENDENT_AMBULATORY_CARE_PROVIDER_SITE_OTHER): Payer: Medicare Other | Admitting: Infectious Disease

## 2016-01-04 VITALS — BP 130/86 | HR 71 | Temp 98.0°F | Wt 192.0 lb

## 2016-01-04 DIAGNOSIS — F1910 Other psychoactive substance abuse, uncomplicated: Secondary | ICD-10-CM

## 2016-01-04 DIAGNOSIS — Z23 Encounter for immunization: Secondary | ICD-10-CM

## 2016-01-04 DIAGNOSIS — B20 Human immunodeficiency virus [HIV] disease: Secondary | ICD-10-CM

## 2016-01-04 DIAGNOSIS — I2510 Atherosclerotic heart disease of native coronary artery without angina pectoris: Secondary | ICD-10-CM

## 2016-01-04 DIAGNOSIS — I10 Essential (primary) hypertension: Secondary | ICD-10-CM

## 2016-01-04 DIAGNOSIS — E039 Hypothyroidism, unspecified: Secondary | ICD-10-CM

## 2016-01-04 DIAGNOSIS — E119 Type 2 diabetes mellitus without complications: Secondary | ICD-10-CM | POA: Diagnosis not present

## 2016-01-04 LAB — TSH: TSH: 1.9 m[IU]/L (ref 0.40–4.50)

## 2016-01-04 MED ORDER — METFORMIN HCL 500 MG PO TABS: 500.0000 mg | ORAL_TABLET | Freq: Two times a day (BID) | ORAL | 11 refills | Status: DC

## 2016-01-04 NOTE — Patient Instructions (Signed)
I am dropping your dose of metfomin to 500 mg TWICE Daily  Ask Selena BattenKim to have your A1c and TSH rechecked in about 3 months  We need a THIRD HEP B in next 5 months given  We will also be giving you meningococcal vaccine but we dont have them in yet  RTC to see me in one year

## 2016-01-04 NOTE — Addendum Note (Signed)
Addended by: Rejeana BrockMURRAY, CANDACE A on: 01/04/2016 04:22 PM   Modules accepted: Orders

## 2016-01-04 NOTE — Progress Notes (Signed)
"chief complaint: follow-up for HIV, DM, and hypothyroidism Subjective:    Patient ID: Jonathan Estes, male    DOB: 1955-04-10, 60 y.o.   MRN: [id number]  HPI  Lakota B Rocco Serenemick is a 60 y.o. male who is doing superbly well on their antiviral regimen of isentress and truvada, with undetectable viral load and health cd4 count.    He has comorbid DM controlled and hypothyroidism on synthroid at with normal TSH.  Past Medical History:  Diagnosis Date  . AIDS (HCC) 07/12/2014  . Anxiety   . Bilateral bunions    left foot worse  . Coronary artery disease involving native coronary artery 01/12/2015  . Diabetes mellitus   . Diabetes mellitus type 2, controlled, without complications (HCC) 01/12/2015  . Diabetes type 2, controlled (HCC) 07/12/2014  . HTN (hypertension)   . Human immunodeficiency virus (HIV) disease   . Hyperlipidemia   . Hypothyroid   . Pneumocystis carinii pneumonia [facility](HCC)     Past Surgical History:  Procedure Laterality Date  . LUNG BIOPSY    . None      Family History  Problem Relation Age of Onset  . Cancer Father     Colon  . Colon cancer Father       Social History   Social History  . Marital status: Single    Spouse name: N/A  . Number of children: N/A  . Years of education: N/A   Social History Main Topics  . Smoking status: Never Smoker  . Smokeless tobacco: Never Used  . Alcohol use 0.5 oz/week    1 drink(s) per week  . Drug use:     Frequency: 5.0 times per week    Types: Marijuana     Comment: Smokes marijuana daily!  . Sexual activity: Yes    Partners: Female   Other Topics Concern  . None   Social History Narrative   Lives alone.          Allergies  Allergen Reactions  . Penicillins   . Sulfamethoxazole-Trimethoprim     REACTION: severe rash     Current Outpatient Prescriptions:  .  ALPRAZolam (XANAX) 0.5 MG tablet, TAKE 1 TABLET BY MOUTH 3 TIMES A DAY FOR ANXIETY/SLEEP, Disp: 90 tablet, Rfl: 1 .  cetirizine (ZYRTEC) 10 MG  tablet, TAKE 1 TABLET (10 MG TOTAL) BY MOUTH DAILY., Disp: 90 tablet, Rfl: 2 .  [employer] LANCETS ORIGINAL MISC, 1 application by Does not apply route daily., Disp: 90 each, Rfl: 4 .  emtricitabine-tenofovir AF (DESCOVY) 200-25 MG tablet, Take 1 tablet by mouth daily., Disp: 30 tablet, Rfl: 11 .  glucose blood (ACCU-CHEK AVIVA) test strip, Use as instructed, Disp: 100 each, Rfl: 12 .  hydrochlorothiazide (MICROZIDE) 12.5 MG capsule, TAKE ONE CAPSULE BY MOUTH EVERY DAY, Disp: 90 capsule, Rfl: 1 .  levothyroxine (SYNTHROID, LEVOTHROID) 25 MCG tablet, TAKE 1 TABLET BY MOUTH EVERY DAY ON AN EMPTY STOMACH, Disp: 90 tablet, Rfl: 1 .  lisinopril (PRINIVIL,ZESTRIL) 40 MG tablet, Take 1 tablet (40 mg total) by mouth daily., Disp: 90 tablet, Rfl: 3 .  metFORMIN (GLUCOPHAGE) 1000 MG tablet, Take 1 tablet (1,000 mg total) by mouth 2 (two) times daily with a meal., Disp: 180 tablet, Rfl: 2 .  pravastatin (PRAVACHOL) 20 MG tablet, TAKE 1 TABLET (20 MG TOTAL) BY MOUTH DAILY., Disp: 90 tablet, Rfl: 3 .  raltegravir (ISENTRESS) 400 MG tablet, Take 1 tablet (400 mg total) by mouth 2 (two) times daily., Disp: 60 tablet, Rfl: 11  Review of Systems  Constitutional: Negative for activity change, appetite change, chills, diaphoresis, fatigue, fever and unexpected weight change.  HENT: Negative for congestion, rhinorrhea, sinus pressure, sneezing, sore throat and trouble swallowing.   Eyes: Negative for photophobia and visual disturbance.  Respiratory: Negative for cough, chest tightness, shortness of breath, wheezing and stridor.   Cardiovascular: Negative for chest pain, palpitations and leg swelling.  Gastrointestinal: Negative for abdominal distention, abdominal pain, anal bleeding, blood in stool, constipation, diarrhea, nausea and vomiting.  Genitourinary: Negative for difficulty urinating, dysuria, flank pain and hematuria.  Musculoskeletal: Negative for arthralgias, back pain, gait problem, joint swelling and  myalgias.  Skin: Negative for color change, pallor, rash and wound.  Neurological: Negative for dizziness, tremors, weakness and light-headedness.  Hematological: Negative for adenopathy. Does not bruise/bleed easily.  Psychiatric/Behavioral: Negative for agitation, behavioral problems, confusion, decreased concentration, dysphoric mood and sleep disturbance.       Objective:   Physical Exam  Constitutional: He is oriented to person, place, and time. He appears well-developed and well-nourished. No distress.  HENT:  Head: Normocephalic and atraumatic.  Mouth/Throat: Oropharynx is clear and moist. No oropharyngeal exudate.  Eyes: Conjunctivae and EOM are normal. Pupils are equal, round, and reactive to light. No scleral icterus.  Neck: Normal range of motion. Neck supple. No JVD present.  Cardiovascular: Normal rate, regular rhythm and normal heart sounds.  Exam reveals no gallop and no friction rub.   No murmur heard. Pulmonary/Chest: Effort normal and breath sounds normal. No respiratory distress. He has no wheezes. He has no rales. He exhibits no tenderness.  Abdominal: He exhibits no distension and no mass. There is no tenderness. There is no rebound and no guarding.  Musculoskeletal: He exhibits no edema or tenderness.  Lymphadenopathy:    He has no cervical adenopathy.  Neurological: He is alert and oriented to person, place, and time. He has normal reflexes. He exhibits normal muscle tone. Coordination normal.  Skin: Skin is warm and dry. He is not diaphoretic. No erythema. No pallor.  Psychiatric: He has a normal mood and affect. His behavior is normal. Judgment and thought content normal.          Assessment & Plan:  HIV: cotninue Isentress BID and once daily DESCOVY. He was uninterested in [facility] since he would still have to take 2 tablets. He might be interested potentially in the new single tablet regimen that is set to come out in February through  [facility]  Hypothyroidism: continue synthroid, recheck TSH today  DM: very good control. I will drop his metformin to 500 mg twice daily since he was started on these medications when he weighed him was 50 pounds more than he does now and is not clear to me that he needs this drug.  HTN: welll controlled  CAD: seen on CT angio. No need for new interventions, no ssx, on statin, Asa  Hyperlipidemia: on statin  Anxiety , generalized: continue prn xanax  I spent greater than 25 minutes with the patient including greater than 50% of time in face to face counsel of the patient re his HIV, his DM, hypothyroidism, CAD and in coordination of his care.

## 2016-01-04 NOTE — BH Specialist Note (Signed)
Counselor met with Jonathan Estes today in the exam room for a warm hand off.  Patient was oriented times four with good affect and dress.  Patient alert and talkative.  Patient shared that he has no problems right now and that his anxiety comes and goes.  Patient said that he would make an appointment when the need arose.  Counselor provided support for patient accordingly and supplied him with a business card containing contact information. Rolena Infante, MA, LPC [facility]

## 2016-01-06 ENCOUNTER — Encounter: Payer: Self-pay | Admitting: Infectious Disease

## 2016-02-17 ENCOUNTER — Other Ambulatory Visit: Payer: Self-pay | Admitting: Infectious Disease

## 2016-02-29 ENCOUNTER — Other Ambulatory Visit: Payer: Self-pay | Admitting: Infectious Disease

## 2016-02-29 DIAGNOSIS — F419 Anxiety disorder, unspecified: Secondary | ICD-10-CM

## 2016-03-26 ENCOUNTER — Other Ambulatory Visit: Payer: Self-pay | Admitting: Infectious Disease

## 2016-03-26 DIAGNOSIS — B20 Human immunodeficiency virus [HIV] disease: Secondary | ICD-10-CM

## 2016-03-26 DIAGNOSIS — Z21 Asymptomatic human immunodeficiency virus [HIV] infection status: Secondary | ICD-10-CM

## 2016-03-26 DIAGNOSIS — Z006 Encounter for examination for normal comparison and control in clinical research program: Secondary | ICD-10-CM

## 2016-05-14 ENCOUNTER — Other Ambulatory Visit: Payer: Self-pay | Admitting: Infectious Disease

## 2016-05-14 DIAGNOSIS — F419 Anxiety disorder, unspecified: Secondary | ICD-10-CM

## 2016-05-17 ENCOUNTER — Other Ambulatory Visit: Payer: Self-pay | Admitting: *Deleted

## 2016-05-17 DIAGNOSIS — F419 Anxiety disorder, unspecified: Secondary | ICD-10-CM

## 2016-05-17 MED ORDER — ALPRAZOLAM 0.5 MG PO TABS: ORAL_TABLET | ORAL | 1 refills | Status: DC

## 2016-05-30 ENCOUNTER — Encounter (INDEPENDENT_AMBULATORY_CARE_PROVIDER_SITE_OTHER): Payer: Medicare Other | Admitting: *Deleted

## 2016-05-30 VITALS — BP 110/63 | HR 77 | Temp 98.3°F | Wt 211.8 lb

## 2016-05-30 DIAGNOSIS — Z23 Encounter for immunization: Secondary | ICD-10-CM

## 2016-05-30 DIAGNOSIS — Z006 Encounter for examination for normal comparison and control in clinical research program: Secondary | ICD-10-CM

## 2016-05-30 DIAGNOSIS — B20 Human immunodeficiency virus [HIV] disease: Secondary | ICD-10-CM

## 2016-05-30 DIAGNOSIS — E119 Type 2 diabetes mellitus without complications: Secondary | ICD-10-CM

## 2016-05-30 DIAGNOSIS — E1022 Type 1 diabetes mellitus with diabetic chronic kidney disease: Secondary | ICD-10-CM

## 2016-05-30 LAB — COMPREHENSIVE METABOLIC PANEL
ALBUMIN: 4 g/dL (ref 3.6–5.1)
ALK PHOS: 70 U/L (ref 40–115)
ALT: 8 U/L — AB (ref 9–46)
AST: 13 U/L (ref 10–35)
BUN: 17 mg/dL (ref 7–25)
CALCIUM: 9.4 mg/dL (ref 8.6–10.3)
CHLORIDE: 102 mmol/L (ref 98–110)
CO2: 26 mmol/L (ref 20–31)
Creat: 1.09 mg/dL (ref 0.70–1.33)
Glucose, Bld: 101 mg/dL — ABNORMAL HIGH (ref 65–99)
POTASSIUM: 4.3 mmol/L (ref 3.5–5.3)
Sodium: 139 mmol/L (ref 135–146)
TOTAL PROTEIN: 7.2 g/dL (ref 6.1–8.1)
Total Bilirubin: 0.3 mg/dL (ref 0.2–1.2)

## 2016-05-30 NOTE — Progress Notes (Signed)
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 He has put on about 15 lbs since last visit and is concerned his hgb A1c will be elevated. I gave him his 3rd Hep B vaccine at this visit. He will be coming back in August for the next study visit and then will schedule a visit to see Dr. Daiva EvesVan Dam.

## 2016-05-31 LAB — HEMOGLOBIN A1C
HEMOGLOBIN A1C: 5.7 % — AB (ref <5.7)
Mean Plasma Glucose: 117 mg/dL

## 2016-05-31 LAB — HEPATITIS C ANTIBODY: HCV Ab: NEGATIVE

## 2016-06-18 ENCOUNTER — Telehealth: Payer: Self-pay | Admitting: *Deleted

## 2016-06-18 NOTE — Telephone Encounter (Signed)
Patient called, asked for something prescribed for allergies. He states allegra and zyrtec are not working. Andree Coss, RN

## 2016-06-18 NOTE — Telephone Encounter (Signed)
Only thing left would be prednisone  for 5 days which will make his diabetes worse. Is he with PCP now?

## 2016-06-19 NOTE — Telephone Encounter (Signed)
Patient declined steroids. Patient would like to try flonase if appropriate.   Not currently with PCP. States no one in [place] will see him because he is positive. [facility] has opened a primary care office close by - RN referred patient for care. Andree Coss, RN

## 2016-06-20 ENCOUNTER — Encounter: Payer: Self-pay | Admitting: *Deleted

## 2016-06-20 LAB — CD4/CD8 (T-HELPER/T-SUPPRESSOR CELL)
CD4 Count: 759
CD4 T CELL HELPER: 29.2
CD8 % Suppressor T Cell: 46.9
CD8 T Cell Abs: 1219

## 2016-06-20 LAB — HIV-1 RNA QUANT-NO REFLEX-BLD: HIV-1 RNA Viral Load: <40

## 2016-07-23 ENCOUNTER — Encounter: Payer: Self-pay | Admitting: Family Medicine

## 2016-07-23 ENCOUNTER — Ambulatory Visit (INDEPENDENT_AMBULATORY_CARE_PROVIDER_SITE_OTHER): Payer: Medicare Other | Admitting: Family Medicine

## 2016-07-23 VITALS — BP 112/71 | HR 76 | Wt 205.2 lb

## 2016-07-23 DIAGNOSIS — I10 Essential (primary) hypertension: Secondary | ICD-10-CM

## 2016-07-23 DIAGNOSIS — F341 Dysthymic disorder: Secondary | ICD-10-CM | POA: Diagnosis not present

## 2016-07-23 DIAGNOSIS — I2510 Atherosclerotic heart disease of native coronary artery without angina pectoris: Secondary | ICD-10-CM | POA: Diagnosis not present

## 2016-07-23 DIAGNOSIS — B20 Human immunodeficiency virus [HIV] disease: Secondary | ICD-10-CM | POA: Diagnosis not present

## 2016-07-23 DIAGNOSIS — E119 Type 2 diabetes mellitus without complications: Secondary | ICD-10-CM

## 2016-07-23 DIAGNOSIS — J3089 Other allergic rhinitis: Secondary | ICD-10-CM

## 2016-07-23 DIAGNOSIS — E1121 Type 2 diabetes mellitus with diabetic nephropathy: Secondary | ICD-10-CM

## 2016-07-23 DIAGNOSIS — E782 Mixed hyperlipidemia: Secondary | ICD-10-CM | POA: Diagnosis not present

## 2016-07-23 DIAGNOSIS — E039 Hypothyroidism, unspecified: Secondary | ICD-10-CM | POA: Diagnosis not present

## 2016-07-23 DIAGNOSIS — E781 Pure hyperglyceridemia: Secondary | ICD-10-CM | POA: Diagnosis not present

## 2016-07-23 DIAGNOSIS — E1169 Type 2 diabetes mellitus with other specified complication: Secondary | ICD-10-CM | POA: Insufficient documentation

## 2016-07-23 LAB — POCT UA - MICROALBUMIN
CREATININE, POC: 300 mg/dL
Microalbumin Ur, POC: 80 mg/L

## 2016-07-23 NOTE — Progress Notes (Signed)
New patient office visit note:  Impression and Recommendations:    1. Controlled type 2 diabetes mellitus without complication, without long-term current use of insulin ([place])   2. Diabetic nephropathy associated with type 2 diabetes mellitus: Microalbuminuria   3. Essential hypertension, benign   4. Mixed hyperlipidemia   5. Hypertriglyceridemia   6. Hypothyroidism, unspecified type   7. Environmental and seasonal allergies   8. h/o Coronary artery disease involving native vessel- med mgt; last seen cards 2013   9. DEPRESSION/ANXIETY   10. Human immunodeficiency virus (HIV) disease ([place])       h/o COCAINE ABUSE, EPISODIC No use since 2009  DEPRESSION/ANXIETY Txed for anxiety and diff sleep by ID- Dr.Van Dam with benzos--> pt understands he will cont to get from them      CAD (coronary artery disease) Coronary calcifications seen on CT Chest.  Thus->  Sent to Cards for eval- recommended Medical Mgt only ( chol control to goal LDL less than 70; BP control, tight control BS/ DM; ASA.   PR ELECTROCARDIOGRAM, COMPLETE 04/12/2011 Minus Breeding, MD    Environmental and seasonal allergies Well controlled currently- zyrtec  Controlled type 2 diabetes mellitus without complication, without long-term current use of insulin (HCC) yrly eye exams  ylrly Urine Micoralb:Crt ratio  - A1c well controlled; ( 5.7 in March, 2018) cont monitor q 3-81mo - cont Ace Inh, monitor renal fxn  - BP under good control   Diabetic nephropathy associated with type 2 diabetes mellitus: Microalbuminuria urine showed microalbuminuria - translates to vascular damage to his kidneys- and controlling his BP and BS are extremely important to slow progression of disease.   - He needs to cont his ACE inh and, monitor his BS and BP at home  - will check GFR next bld draw  HLD (hyperlipidemia) Cont statin  - check in lipids in 3 mo- Sept or so  - check q 6-12 mo  - goal ldl less than  70  Hypertriglyceridemia Goal--> try to get less than 150.  - check and may add fibrates, lovaza, or niacin in future    Human [place] virus (HIV) disease [place]  Dr VTommy Medal Hypothyroidism Will obtain TSH, T4 next bld draw in Sept.   Asx   cont meds at current dose   The patient was counseled, risk factors were discussed, anticipatory guidance given.   No orders of the defined types were placed in this encounter.    Discontinued Medications   No medications on file      Orders Placed This Encounter  Procedures  . POCT UA - Microalbumin     Gross side effects, risk and benefits, and alternatives of medications discussed with patient.  Patient is aware that all medications have potential side effects and we are unable to predict every side effect or drug-drug interaction that may occur.  Expresses verbal understanding and consents to current therapy plan and treatment regimen.  Return for mid Sept- Come fasting- for Complete physical.  Please see AVS handed out to patient at the end of our visit for further patient instructions/ counseling done pertaining to today's office visit.    Note: This document was prepared using Dragon voice recognition software and may include unintentional dictation errors.  ----------------------------------------------------------------------------------------------------------------------    Subjective:    Chief complaint:   Chief Complaint  Patient presents with  . Establish Care     HPI: Jonathan CROMARTIEis a pleasant 61y.o.  male who presents to [place] [facility] today to review their medical history with me and establish care.   Pt's HIV doc---> Dr Tommy Medal told him he needed a PCP-   All meds from him prior.   I asked the patient to review their chronic problem list with me to ensure everything was updated and accurate.    All recent office visits with other providers, any  medical records that patient brought in etc  - I reviewed today.     Also asked pt to get me medical records from [facility] providers/ specialists that they had seen within the past 3-5 years- if they are in private practice and/or do not work for a [employer], [facility], [place], [place] or [employer] owned practice.  Told them to call their specialists to clarify this if they are not sure.    1)   ----->  Lives by self.  Not working-- ON disability-for HIV 20+ yrs-  Unprotected sex many yrs ago; no IVDrugs.   on medicare for 8-9 yrs.   Truck driver prior.   2)  Tob- never on reg basis,   cannabis daily for 40 yrs or so.   Last use of drugs-->  Over 10 yrs ago.   Fam Hx:   Father- dx colon ca age 31- diagnosed and gone 2 wks later.    3)   DM:   BS---> never really checks them at home.  On metformin No sx, feels good.   A1c around 6 or less last several times Last A1C in the office was:  Lab Results  Component Value Date   HGBA1C 5.7 (H) 05/30/2016   HGBA1C 5.5 12/01/2015   HGBA1C 5.7 (H) 01/12/2015    Lab Results  Component Value Date   MICROALBUR 80 07/23/2016   [place] 31 12/01/2015   CREATININE 1.09 05/30/2016      Wt Readings from Last 3 Encounters:  07/23/16 205 lb 3.2 oz (93.1 kg)  05/30/16 211 lb 12 oz (96 kg)  01/04/16 192 lb (87.1 kg)    4) HTN:  On HCTZ and lisinopril.  Tol meds well.   Well controlled.   No Sx/ complaints.    BP Readings from Last 3 Encounters:  07/23/16 112/71  05/30/16 110/63  01/04/16 130/86    Pulse Readings from Last 3 Encounters:  07/23/16 76  05/30/16 77  01/04/16 71    5)   HLD:   Last Sept- 2017--> had lipid panel. On Pravochol.  tol well.    Ref Range & Units 84moago 165yrgo 2y74yro    Cholesterol 125 - 200 mg/dL 128  146  130R, CM    Triglycerides <150 mg/dL 307   <150 mg/dL" class="rz_a hlt1024"> 235   <150 mg/dL" class="rz_b hlt1024"> 213     HDL >=40 mg/dL 36   39   41R    Total CHOL/HDL Ratio <=5.0 Ratio 3.6  <=5.0 Ratio"  class="rz_a hlt1024"> 3.7  3.2R    VLDL <30 mg/dL 61   <30 mg/dL" class="rz_a hlt1024"> 47   43R     LDL Cholesterol <130 mg/dL 31  <130 mg/dL" class="rz_a hlt1024"> 60CM  46R, CM       Wt Readings from Last 3 Encounters:  07/23/16 205 lb 3.2 oz (93.1 kg)  05/30/16 211 lb 12 oz (96 kg)  01/04/16 192 lb (87.1 kg)   BP Readings from Last 3 Encounters:  07/23/16 112/71  05/30/16 110/63  01/04/16  130/86   Pulse Readings from Last 3 Encounters:  07/23/16 76  05/30/16 77  01/04/16 71   BMI Readings from Last 3 Encounters:  07/23/16 30.66 kg/m  05/30/16 31.64 kg/m  01/04/16 28.69 kg/m    Patient Care Team    Relationship Specialty Notifications Start End  Tommy Medal, Lavell Islam, MD PCP - General Infectious Diseases  08/07/11   Tommy Medal, Lavell Islam, MD PCP - Infectious Diseases Infectious Diseases All results, Admissions 09/16/10   Inda Castle, MD Consulting Physician Gastroenterology  07/23/16     Patient Active Problem List   Diagnosis Date Noted  . HLD (hyperlipidemia) 07/23/2016    Priority: High  . Diabetic nephropathy associated with type 2 diabetes mellitus: Microalbuminuria 07/23/2016    Priority: High  . Hypertriglyceridemia 07/11/2010    Priority: High  . Controlled type 2 diabetes mellitus without complication, without long-term current use of insulin ([place]) 02/07/2009    Priority: High  . Essential hypertension, benign 04/12/2008    Priority: High  . AIDS ([place]) 07/12/2014    Priority: Medium  . CAD (coronary artery disease) 07/16/2011    Priority: Medium  . Hypothyroidism 04/24/2008    Priority: Medium  . Human immunodeficiency virus (HIV) disease ([place]) 10/14/2007    Priority: Medium  . Environmental and seasonal allergies 07/30/2016    Priority: Low  . DEPRESSION/ANXIETY 06/27/2009    Priority: Low  . h/o COCAINE ABUSE, EPISODIC 10/14/2007    Priority: Low  . CANNABIS ABUSE, HX OF 10/14/2007    Priority: Low  . Dyspnea 05/10/2011  . Abnormal  chest CT 04/12/2011  . HTN (hypertension)   . Anxiety 01/29/2011  . PULMONARY NODULE 02/21/2010  . ORBITAL FLOOR , CLOSED FRACTURE 02/21/2010  . UNSPECIFIED VISUAL LOSS 12/26/2009  . TRANSIENT GLOBAL AMNESIA 12/26/2009  . SHOULDER STRAIN 06/13/2009  . FATIGUE 04/12/2008  . METHICILLIN SUSCEPTIBLE STAPH INF CCE & UNS SITE 10/14/2007  . CANDIDIASIS OF MOUTH 10/14/2007  . PNEUMOCYSTIS PNEUMONIA 10/14/2007  . CUTANEOUS ERUPTIONS, DRUG-INDUCED 10/14/2007     Past Medical History:  Diagnosis Date  . AIDS ([place]) 07/12/2014  . Anxiety   . Bilateral bunions    left foot worse  . Coronary artery disease involving native coronary artery 01/12/2015  . Diabetes mellitus   . Diabetes mellitus type 2, controlled, without complications ([place]) 13/0/8657  . Diabetes type 2, controlled ([place]) 07/12/2014  . HTN (hypertension)   . Human immunodeficiency virus (HIV) disease ([place])   . Hyperlipidemia   . Hypothyroid   . Pneumocystis carinii pneumonia [facility])      Past Medical History:  Diagnosis Date  . AIDS ([place]) 07/12/2014  . Anxiety   . Bilateral bunions    left foot worse  . Coronary artery disease involving native coronary artery 01/12/2015  . Diabetes mellitus   . Diabetes mellitus type 2, controlled, without complications ([place]) 84/08/9627  . Diabetes type 2, controlled ([place]) 07/12/2014  . HTN (hypertension)   . Human immunodeficiency virus (HIV) disease ([place])   . Hyperlipidemia   . Hypothyroid   . Pneumocystis carinii pneumonia [facility])      Past Surgical History:  Procedure Laterality Date  . LUNG BIOPSY    . None       Family History  Problem Relation Age of Onset  . Cancer Father        Colon  . Colon cancer Father   . Leukemia Maternal Grandfather      History  Drug Use  . Frequency:  5.0 times per week  . Types: Marijuana    Comment: Smokes marijuana daily!     History  Alcohol Use  . 0.5 oz/week  . 1 Standard drinks or equivalent per week     History  Smoking  Status  . Never Smoker  Smokeless Tobacco  . Never Used     Outpatient Encounter Prescriptions as of 07/23/2016  Medication Sig  . ALPRAZolam (XANAX) 0.5 MG tablet TAKE 1 TABLET BY MOUTH 3 TIMES A DAY AS NEEDED ANXIETY/SLEEP  . cetirizine (ZYRTEC) 10 MG tablet TAKE 1 TABLET (10 MG TOTAL) BY MOUTH DAILY.  . [employer] LANCETS ORIGINAL MISC 1 application by Does not apply route daily.  . DESCOVY 200-25 MG tablet TAKE 1 TABLET BY MOUTH DAILY  . glucose blood (ACCU-CHEK AVIVA) test strip Use as instructed  . hydrochlorothiazide (MICROZIDE) 12.5 MG capsule TAKE ONE CAPSULE BY MOUTH EVERY DAY  . ISENTRESS 400 MG tablet TAKE 1 TABLET BY MOUTH TWICE DAILY  . levothyroxine (SYNTHROID, LEVOTHROID) 25 MCG tablet TAKE 1 TABLET BY MOUTH EVERY DAY ON AN EMPTY STOMACH  . lisinopril (PRINIVIL,ZESTRIL) 40 MG tablet TAKE 1 TABLET (40 MG TOTAL) BY MOUTH DAILY.  . metFORMIN (GLUCOPHAGE) 500 MG tablet Take 1 tablet (500 mg total) by mouth 2 (two) times daily with a meal.  . [DISCONTINUED] pravastatin (PRAVACHOL) 20 MG tablet TAKE 1 TABLET (20 MG TOTAL) BY MOUTH DAILY.   No facility-administered encounter medications on file as of 07/23/2016.     Allergies: Penicillins and Sulfamethoxazole-trimethoprim   Review of Systems  Constitutional: Negative for chills, diaphoresis, fever, malaise/fatigue and weight loss.  HENT: Negative for congestion, sore throat and tinnitus.   Eyes: Negative for blurred vision, double vision and photophobia.  Respiratory: Negative for cough and wheezing.   Cardiovascular: Negative for chest pain and palpitations.  Gastrointestinal: Negative for blood in stool, diarrhea, nausea and vomiting.  Genitourinary: Negative for dysuria, frequency and urgency.  Musculoskeletal: Negative for joint pain and myalgias.  Skin: Negative for itching and rash.  Neurological: Negative for dizziness, focal weakness, weakness and headaches.  Endo/Heme/Allergies: Positive for environmental allergies.  Negative for polydipsia. Does not bruise/bleed easily.  Psychiatric/Behavioral: Negative for depression, memory loss, substance abuse and suicidal ideas. The patient is nervous/anxious. The patient does not have insomnia.        Txed for anxiety and diff sleep by Tommy Medal with benzos--> will cont to get from him     Objective:   Blood pressure 112/71, pulse 76, weight 205 lb 3.2 oz (93.1 kg). Body mass index is 30.66 kg/m. General: Well Developed, well nourished, and in no acute distress.  Neuro: Alert and oriented x3, extra-ocular muscles intact, sensation grossly intact.  HEENT:[place]/AT, PERRLA, neck supple, No carotid bruits Skin: no gross rashes  Cardiac: Regular rate and rhythm Respiratory: Essentially clear to auscultation bilaterally. Not using accessory muscles, speaking in full sentences.  Abdominal: not grossly distended Musculoskeletal: Ambulates w/o diff, FROM * 4 ext.  Vasc: less 2 sec cap RF, warm and pink  Psych:  No HI/SI, judgement and insight good, Euthymic mood. Full Affect.    Recent Results (from the past 2160 hour(s))  HIV 1 RNA quant-no reflex-bld     Status: None   Collection Time: 05/30/16 12:00 AM  Result Value Ref Range   HIV-1 RNA Viral Load <40   Cd4/cd8 (t-helper/t-suppressor cell)     Status: None   Collection Time: 05/30/16 12:00 AM  Result Value Ref Range   CD4 Count  759    CD4 % Helper T Cell 29.2    CD8 T Cell Abs 1,219    CD8 % Suppressor T Cell 46.9   Comp Met (CMET)     Status: Abnormal   Collection Time: 05/30/16  9:20 AM  Result Value Ref Range   Sodium 139 135 - 146 mmol/L   Potassium 4.3 3.5 - 5.3 mmol/L   Chloride 102 98 - 110 mmol/L   CO2 26 20 - 31 mmol/L   Glucose, Bld 101 (H) 65 - 99 mg/dL   BUN 17 7 - 25 mg/dL   Creat 1.09 0.70 - 1.33 mg/dL    Comment:   For patients > or = 59 years of age: The upper reference limit for Creatinine is approximately 13% higher for people identified as African-American.      Total Bilirubin  0.3 0.2 - 1.2 mg/dL   Alkaline Phosphatase 70 40 - 115 U/L   AST 13 10 - 35 U/L   ALT 8 (L) 9 - 46 U/L   Total Protein 7.2 6.1 - 8.1 g/dL   Albumin 4.0 3.6 - 5.1 g/dL   Calcium 9.4 8.6 - 10.3 mg/dL  Hepatitis C Antibody     Status: None   Collection Time: 05/30/16  9:20 AM  Result Value Ref Range   HCV Ab NEGATIVE NEGATIVE  HgB A1c     Status: Abnormal   Collection Time: 05/30/16  9:20 AM  Result Value Ref Range   Hgb A1c MFr Bld 5.7 (H) <5.7 %    Comment:   For someone without known diabetes, a hemoglobin A1c value between 5.7% and 6.4% is consistent with prediabetes and should be confirmed with a follow-up test.   For someone with known diabetes, a value <7% indicates that their diabetes is well controlled. A1c targets should be individualized based on duration of diabetes, age, co-morbid conditions and other considerations.   This assay result is consistent with an increased risk of diabetes.   Currently, no consensus exists regarding use of hemoglobin A1c for diagnosis of diabetes in children.      Mean Plasma Glucose 117 mg/dL  POCT UA - Microalbumin     Status: None   Collection Time: 07/23/16  4:13 PM  Result Value Ref Range   Microalbumin Ur, POC 80 mg/L   Creatinine, POC 300 mg/dL   Albumin/Creatinine Ratio, Urine, POC 30-300

## 2016-07-23 NOTE — Assessment & Plan Note (Signed)
No use since 2009

## 2016-07-23 NOTE — Patient Instructions (Signed)
12/01/15 was last full  bldwrk-- [facility]    Cont to monitor BS if feels badly  Monitor BP- if out at store     Guidelines for a Low Cholesterol, Low Saturated Fat Diet   Fats - Limit total intake of fats and oils. - Avoid butter, stick margarine, shortening, lard, palm and coconut oils. - Limit mayonnaise, salad dressings, gravies and sauces, unless they are homemade with low-fat ingredients. - Limit chocolate. - Choose low-fat and nonfat products, such as low-fat mayonnaise, low-fat or non-hydrogenated peanut butter, low-fat or fat-free salad dressings and nonfat gravy. - Use vegetable oil, such as canola or olive oil. - Look for margarine that does not contain trans fatty acids. - Use nuts in moderate amounts. - Read ingredient labels carefully to determine both amount and type of fat present in foods. Limit saturated and trans fats! - Avoid high-fat processed and convenience foods.  Meats and Meat Alternatives - Choose fish, chicken, [place] and lean meats. - Use dried beans, peas, lentils and tofu. - Limit egg yolks to three to four per week. - If you eat red meat, limit to no more than three servings per week and choose loin or round cuts. - Avoid fatty meats, such as bacon, sausage, franks, luncheon meats and ribs. - Avoid all organ meats, including liver.  Dairy - Choose nonfat or low-fat milk, yogurt and cottage cheese. - Most cheeses are high in fat. Choose cheeses made from non-fat milk, such as mozzarella and ricotta cheese. - Choose light or fat-free cream cheese and sour cream. - Avoid cream and sauces made with cream.  Fruits and Vegetables - Eat a wide variety of fruits and vegetables. - Use lemon juice, vinegar or "mist" olive oil on vegetables. - Avoid adding sauces, fat or oil to vegetables.  Breads, Cereals and Grains - Choose whole-grain breads, cereals, pastas and rice. - Avoid high-fat snack foods, such as granola, cookies,  pies, pastries, doughnuts and croissants.  Cooking Tips - Avoid deep fried foods. - Trim visible fat off meats and remove skin from poultry before cooking. - Bake, broil, boil, poach or roast poultry, fish and lean meats. - Drain and discard fat that drains out of meat as you cook it. - Add little or no fat to foods. - Use vegetable oil sprays to grease pans for cooking or baking. - Steam vegetables. - Use herbs or no-oil marinades to flavor foods.

## 2016-07-23 NOTE — Assessment & Plan Note (Addendum)
Txed for anxiety and diff sleep by ID- Dr.Van Dam with benzos--> pt understands he will cont to get from them

## 2016-07-25 ENCOUNTER — Other Ambulatory Visit: Payer: Self-pay | Admitting: Infectious Disease

## 2016-07-25 DIAGNOSIS — E785 Hyperlipidemia, unspecified: Secondary | ICD-10-CM

## 2016-07-26 ENCOUNTER — Other Ambulatory Visit: Payer: Self-pay | Admitting: *Deleted

## 2016-07-30 DIAGNOSIS — J3089 Other allergic rhinitis: Secondary | ICD-10-CM | POA: Insufficient documentation

## 2016-07-30 NOTE — Assessment & Plan Note (Signed)
Txmnt per ID-  Dr Daiva EvesVan Dam

## 2016-07-30 NOTE — Assessment & Plan Note (Signed)
yrly eye exams  ylrly Urine Micoralb:Crt ratio  - A1c well controlled; ( 5.7 in March, 2018) cont monitor q 3-4916mo  - cont Ace Inh, monitor renal fxn  - BP under good control

## 2016-07-30 NOTE — Assessment & Plan Note (Signed)
Goal--> try to get less than 150.  - check and may add fibrates, lovaza, or niacin in future

## 2016-07-30 NOTE — Assessment & Plan Note (Signed)
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   - He needs to cont his ACE inh and, monitor his BS and BP at home  - will check GFR next bld draw

## 2016-07-30 NOTE — Assessment & Plan Note (Addendum)
Cont statin  - check in lipids in 3 mo- Sept or so  - check q 6-12 mo  - goal ldl less than 70

## 2016-07-30 NOTE — Assessment & Plan Note (Signed)
Well controlled currently- zyrtec

## 2016-07-30 NOTE — Assessment & Plan Note (Signed)
Coronary calcifications seen on CT Chest.  Thus->  Sent to Cards for eval- recommended Medical Mgt only ( chol control to goal LDL less than 70; BP control, tight control BS/ DM; ASA.   PR ELECTROCARDIOGRAM, COMPLETE 04/12/2011 Rollene RotundaHochrein, James, MD

## 2016-07-30 NOTE — Assessment & Plan Note (Addendum)
Will obtain TSH, T4 next bld draw in Sept.   Asx   cont meds at current dose

## 2016-07-31 ENCOUNTER — Other Ambulatory Visit: Payer: Self-pay | Admitting: Infectious Disease

## 2016-07-31 DIAGNOSIS — F419 Anxiety disorder, unspecified: Secondary | ICD-10-CM

## 2016-09-05 ENCOUNTER — Other Ambulatory Visit: Payer: Self-pay | Admitting: Infectious Disease

## 2016-09-10 ENCOUNTER — Other Ambulatory Visit: Payer: Self-pay | Admitting: Infectious Disease

## 2016-09-10 DIAGNOSIS — B20 Human immunodeficiency virus [HIV] disease: Secondary | ICD-10-CM

## 2016-09-10 DIAGNOSIS — Z21 Asymptomatic human immunodeficiency virus [HIV] infection status: Secondary | ICD-10-CM

## 2016-09-10 DIAGNOSIS — Z006 Encounter for examination for normal comparison and control in clinical research program: Secondary | ICD-10-CM

## 2016-10-04 ENCOUNTER — Other Ambulatory Visit: Payer: Self-pay | Admitting: Infectious Disease

## 2016-10-04 ENCOUNTER — Encounter (INDEPENDENT_AMBULATORY_CARE_PROVIDER_SITE_OTHER): Payer: Self-pay | Admitting: *Deleted

## 2016-10-04 VITALS — BP 108/73 | HR 81 | Temp 98.5°F | Ht 68.5 in | Wt 194.5 lb

## 2016-10-04 DIAGNOSIS — Z006 Encounter for examination for normal comparison and control in clinical research program: Secondary | ICD-10-CM

## 2016-10-04 DIAGNOSIS — B20 Human immunodeficiency virus [HIV] disease: Secondary | ICD-10-CM

## 2016-10-04 LAB — LIPID PANEL
CHOLESTEROL: 125 mg/dL (ref <200)
HDL: 15 mg/dL — ABNORMAL LOW (ref >40)
LDL CALC: 59 mg/dL (ref <100)
TRIGLYCERIDES: 253 mg/dL — AB (ref <150)
Total CHOL/HDL Ratio: 8.3 Ratio — ABNORMAL HIGH (ref <5.0)
VLDL: 51 mg/dL — ABNORMAL HIGH (ref <30)

## 2016-10-04 LAB — COMPREHENSIVE METABOLIC PANEL
ALBUMIN: 4 g/dL (ref 3.6–5.1)
ALK PHOS: 117 U/L — AB (ref 40–115)
ALT: 13 U/L (ref 9–46)
AST: 16 U/L (ref 10–35)
BUN: 25 mg/dL (ref 7–25)
CALCIUM: 9.5 mg/dL (ref 8.6–10.3)
CO2: 24 mmol/L (ref 20–31)
Chloride: 98 mmol/L (ref 98–110)
Creat: 1.36 mg/dL — ABNORMAL HIGH (ref 0.70–1.33)
Glucose, Bld: 98 mg/dL (ref 65–99)
Potassium: 4.7 mmol/L (ref 3.5–5.3)
Sodium: 135 mmol/L (ref 135–146)
TOTAL PROTEIN: 8 g/dL (ref 6.1–8.1)
Total Bilirubin: 0.3 mg/dL (ref 0.2–1.2)

## 2016-10-04 NOTE — Progress Notes (Signed)
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 He has a fading rash all over his trunk, back, upper legs and arms which he says started about 3 weeks ago. It does not itch and he has been using hydrocortisone cream and taking benadryl for it. He denies any sexual contact in the past several months that he can remember and thinks that the rash may be related to a new T-shirt that he started wearing the week it started. He was also working in a garage the day it started. We will check RPR just to be sure and I will let Jonathan Estes know about it. He denies any other new problems or medications. He will be  Returning in December in for the next study visit and to see Jonathan Estes.

## 2016-10-05 LAB — RPR

## 2016-10-05 LAB — HEMOGLOBIN A1C
HEMOGLOBIN A1C: 6 % — AB (ref <5.7)
MEAN PLASMA GLUCOSE: 126 mg/dL

## 2016-10-05 LAB — PROTEIN, URINE, RANDOM: Total Protein, Urine: 28 mg/dL — ABNORMAL HIGH (ref 5–25)

## 2016-10-05 LAB — CREATININE, URINE, RANDOM: CREATININE, URINE: 135 mg/dL (ref 20–370)

## 2016-10-24 ENCOUNTER — Encounter: Payer: Self-pay | Admitting: *Deleted

## 2016-10-24 LAB — HIV-1 RNA QUANT-NO REFLEX-BLD

## 2016-10-24 LAB — CD4/CD8 (T-HELPER/T-SUPPRESSOR CELL)
CD4 COUNT: 660
CD4 T CELL HELPER: 27.5
CD8 % Suppressor T Cell: 46.9
CD8 T Cell Abs: 1126

## 2016-11-26 ENCOUNTER — Ambulatory Visit: Payer: Medicare Other | Admitting: Family Medicine

## 2016-11-26 ENCOUNTER — Encounter: Payer: Self-pay | Admitting: Family Medicine

## 2016-11-26 VITALS — BP 121/85 | HR 79 | Ht 69.5 in | Wt 190.1 lb

## 2016-11-26 DIAGNOSIS — E1129 Type 2 diabetes mellitus with other diabetic kidney complication: Secondary | ICD-10-CM | POA: Diagnosis not present

## 2016-11-26 DIAGNOSIS — R748 Abnormal levels of other serum enzymes: Secondary | ICD-10-CM

## 2016-11-26 DIAGNOSIS — E1169 Type 2 diabetes mellitus with other specified complication: Secondary | ICD-10-CM

## 2016-11-26 DIAGNOSIS — E786 Lipoprotein deficiency: Secondary | ICD-10-CM

## 2016-11-26 DIAGNOSIS — B20 Human immunodeficiency virus [HIV] disease: Secondary | ICD-10-CM

## 2016-11-26 DIAGNOSIS — I10 Essential (primary) hypertension: Secondary | ICD-10-CM

## 2016-11-26 DIAGNOSIS — E1121 Type 2 diabetes mellitus with diabetic nephropathy: Secondary | ICD-10-CM

## 2016-11-26 DIAGNOSIS — Z0000 Encounter for general adult medical examination without abnormal findings: Secondary | ICD-10-CM

## 2016-11-26 DIAGNOSIS — E782 Mixed hyperlipidemia: Secondary | ICD-10-CM

## 2016-11-26 DIAGNOSIS — Z1211 Encounter for screening for malignant neoplasm of colon: Secondary | ICD-10-CM

## 2016-11-26 DIAGNOSIS — Z800 Family history of malignant neoplasm of digestive organs: Secondary | ICD-10-CM | POA: Insufficient documentation

## 2016-11-26 DIAGNOSIS — R809 Proteinuria, unspecified: Secondary | ICD-10-CM

## 2016-11-26 DIAGNOSIS — E1159 Type 2 diabetes mellitus with other circulatory complications: Secondary | ICD-10-CM

## 2016-11-26 DIAGNOSIS — K644 Residual hemorrhoidal skin tags: Secondary | ICD-10-CM

## 2016-11-26 LAB — POCT UA - MICROALBUMIN
Albumin/Creatinine Ratio, Urine, POC: <30
Creatinine, POC: 100 mg/dL
MICROALBUMIN (UR) POC: 30 mg/L

## 2016-11-26 LAB — POC HEMOCCULT BLD/STL (OFFICE/1-CARD/DIAGNOSTIC): FECAL OCCULT BLD: NEGATIVE

## 2016-11-26 NOTE — Patient Instructions (Addendum)
Please remember that you're due for repeat colonoscopyin March 2019 which would be exactly 5 years from one prior done.  This is done through Dr. Kelby Fam office of gastroenterology.   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 Pleas make sure you are using stool softeners to keep your stool soft to help prevent a flare up as well.    Preventive Care for Adults, Male A healthy lifestyle and preventive care can promote health and wellness. Preventive health guidelines for men include the following key practices:  A routine yearly physical is a good way to check with your health care provider about your health and preventative screening. It is a chance to share any concerns and updates on your health and to receive a thorough exam.  Visit your dentist for a routine exam and preventative care every 6 months. Brush your teeth twice a day and floss once a day. Good oral hygiene prevents tooth decay and gum disease.  The frequency of eye exams is based on your age, health, family medical history, use of contact lenses, and other factors. Follow your health care provider's recommendations for frequency of eye exams.  Eat a healthy diet. Foods such as vegetables, fruits, whole grains, low-fat dairy products, and lean protein foods contain the nutrients you need without too many calories. Decrease your intake of foods high in solid fats, added sugars, and salt. Eat the right amount of calories for you.Get information about a proper diet from your health care provider, if necessary.  Regular physical exercise is one of the most important things you can do for your health. Most adults should get at least 150 minutes of moderate-intensity exercise (any activity that increases your heart rate and causes you to sweat) each week. In addition, most adults need muscle-strengthening exercises on 2 or more days a  week.  Maintain a healthy weight. The body mass index (BMI) is a screening tool to identify possible weight problems. It provides an estimate of body fat based on height and weight. Your health care provider can find your BMI and can help you achieve or maintain a healthy weight.For adults 20 years and older:  A BMI below 18.5 is considered underweight.  A BMI of 18.5 to 24.9 is normal.  A BMI of 25 to 29.9 is considered overweight.  A BMI of 30 and above is considered obese.  Maintain normal blood lipids and cholesterol levels by exercising and minimizing your intake of saturated fat. Eat a balanced diet with plenty of fruit and vegetables. Blood tests for lipids and cholesterol should begin at age 37 and be repeated every 5 years. If your lipid or cholesterol levels are high, you are over 50, or you are at high risk for heart disease, you may need your cholesterol levels checked more frequently.Ongoing high lipid and cholesterol levels should be treated with medicines if diet and exercise are not working.  If you smoke, find out from your health care provider how to quit. If you do not use tobacco, do not start.  Lung cancer screening is recommended for adults aged 90-80 years who are at high risk for developing lung cancer because of a history of smoking. A yearly low-dose CT scan of the lungs is recommended for people who have at least a 30-pack-year history of smoking and are a current smoker or have quit within the past 15 years. A pack year of smoking  is smoking an average of 1 pack of cigarettes a day for 1 year (for example: 1 pack a day for 30 years or 2 packs a day for 15 years). Yearly screening should continue until the smoker has stopped smoking for at least 15 years. Yearly screening should be stopped for people who develop a health problem that would prevent them from having lung cancer treatment.  If you choose to drink alcohol, do not have more than 2 drinks per day. One drink  is considered to be 12 ounces (355 mL) of beer, 5 ounces (148 mL) of wine, or 1.5 ounces (44 mL) of liquor.  Avoid use of street drugs. Do not share needles with anyone. Ask for help if you need support or instructions about stopping the use of drugs.  High blood pressure causes heart disease and increases the risk of stroke. Your blood pressure should be checked at least every 1-2 years. Ongoing high blood pressure should be treated with medicines, if weight loss and exercise are not effective.  If you are 24-72 years old, ask your health care provider if you should take aspirin to prevent heart disease.  Diabetes screening is done by taking a blood sample to check your blood glucose level after you have not eaten for a certain period of time (fasting). If you are not overweight and you do not have risk factors for diabetes, you should be screened once every 3 years starting at age 68. If you are overweight or obese and you are 44-36 years of age, you should be screened for diabetes every year as part of your cardiovascular risk assessment.  Colorectal cancer can be detected and often prevented. Most routine colorectal cancer screening begins at the age of 60 and continues through age 56. However, your health care provider may recommend screening at an earlier age if you have risk factors for colon cancer. On a yearly basis, your health care provider may provide home test kits to check for hidden blood in the stool. Use of a small camera at the end of a tube to directly examine the colon (sigmoidoscopy or colonoscopy) can detect the earliest forms of colorectal cancer. Talk to your health care provider about this at age 46, when routine screening begins. Direct exam of the colon should be repeated every 5-10 years through age 58, unless early forms of precancerous polyps or small growths are found.  People who are at an increased risk for hepatitis B should be screened for this virus. You are considered  at high risk for hepatitis B if:  You were born in a country where hepatitis B occurs often. Talk with your health care provider about which countries are considered high risk.  Your parents were born in a high-risk country and you have not received a shot to protect against hepatitis B (hepatitis B vaccine).  You have HIV or AIDS.  You use needles to inject street drugs.  You live with, or have sex with, someone who has hepatitis B.  You are a man who has sex with other men (MSM).  You get hemodialysis treatment.  You take certain medicines for conditions such as cancer, organ transplantation, and autoimmune conditions.  Hepatitis C blood testing is recommended for all people born from [birth year] through 1965 and any individual with known risks for hepatitis C.  Practice safe sex. Use condoms and avoid high-risk sexual practices to reduce the spread of sexually transmitted infections (STIs). STIs include gonorrhea, chlamydia, syphilis, trichomonas, herpes,  HPV, and human immunodeficiency virus (HIV). Herpes, HIV, and HPV are viral illnesses that have no cure. They can result in disability, cancer, and death.  If you are a man who has sex with other men, you should be screened at least once per year for:  HIV.  Urethral, rectal, and pharyngeal infection of gonorrhea, chlamydia, or both.  If you are at risk of being infected with HIV, it is recommended that you take a prescription medicine daily to prevent HIV infection. This is called preexposure prophylaxis (PrEP). You are considered at risk if:  You are a man who has sex with other men (MSM) and have other risk factors.  You are a heterosexual man, are sexually active, and are at increased risk for HIV infection.  You take drugs by injection.  You are sexually active with a partner who has HIV.  Talk with your health care provider about whether you are at high risk of being infected with HIV. If you choose to begin PrEP, you should  first be tested for HIV. You should then be tested every 3 months for as long as you are taking PrEP.  A one-time screening for abdominal aortic aneurysm (AAA) and surgical repair of large AAAs by ultrasound are recommended for men ages 7 to 84 years who are current or former smokers.  Healthy men should no longer receive prostate-specific antigen (PSA) blood tests as part of routine cancer screening. Talk with your health care provider about prostate cancer screening.  Testicular cancer screening is not recommended for adult males who have no symptoms. Screening includes self-exam, a health care provider exam, and other screening tests. Consult with your health care provider about any symptoms you have or any concerns you have about testicular cancer.  Use sunscreen. Apply sunscreen liberally and repeatedly throughout the day. You should seek shade when your shadow is shorter than you. Protect yourself by wearing long sleeves, pants, a wide-brimmed hat, and sunglasses year round, whenever you are outdoors.  Once a month, do a whole-body skin exam, using a mirror to look at the skin on your back. Tell your health care provider about new moles, moles that have irregular borders, moles that are larger than a pencil eraser, or moles that have changed in shape or color.  Stay current with required vaccines (immunizations).  Influenza vaccine. All adults should be immunized every year.  Tetanus, diphtheria, and acellular pertussis (Td, Tdap) vaccine. An adult who has not previously received Tdap or who does not know his vaccine status should receive 1 dose of Tdap. This initial dose should be followed by tetanus and diphtheria toxoids (Td) booster doses every 10 years. Adults with an unknown or incomplete history of completing a 3-dose immunization series with Td-containing vaccines should begin or complete a primary immunization series including a Tdap dose. Adults should receive a Td booster every 10  years.  Varicella vaccine. An adult without evidence of immunity to varicella should receive 2 doses or a second dose if he has previously received 1 dose.  Human papillomavirus (HPV) vaccine. Males aged 11-21 years who have not received the vaccine previously should receive the 3-dose series. Males aged 22-26 years may be immunized. Immunization is recommended through the age of 26 years for any male who has sex with males and did not get any or all doses earlier. Immunization is recommended for any person with an immunocompromised condition through the age of 61 years if he did not get any or all doses  earlier. During the 3-dose series, the second dose should be obtained 4-8 weeks after the first dose. The third dose should be obtained 24 weeks after the first dose and 16 weeks after the second dose.  Zoster vaccine. One dose is recommended for adults aged 66 years or older unless certain conditions are present.  Measles, mumps, and rubella (MMR) vaccine. Adults born before 60 generally are considered immune to measles and mumps. Adults born in [birth year] or later should have 1 or more doses of MMR vaccine unless there is a contraindication to the vaccine or there is laboratory evidence of immunity to each of the three diseases. A routine second dose of MMR vaccine should be obtained at least 28 days after the first dose for students attending postsecondary schools, health care workers, or international travelers. People who received inactivated measles vaccine or an unknown type of measles vaccine during 1963-1967 should receive 2 doses of MMR vaccine. People who received inactivated mumps vaccine or an unknown type of mumps vaccine before 1979 and are at high risk for mumps infection should consider immunization with 2 doses of MMR vaccine. Unvaccinated health care workers born before [birth year] who lack laboratory evidence of measles, mumps, or rubella immunity or laboratory confirmation of disease should  consider measles and mumps immunization with 2 doses of MMR vaccine or rubella immunization with 1 dose of MMR vaccine.  Pneumococcal 13-valent conjugate (PCV13) vaccine. When indicated, a person who is uncertain of his immunization history and has no record of immunization should receive the PCV13 vaccine. All adults 50 years of age and older should receive this vaccine. An adult aged 68 years or older who has certain medical conditions and has not been previously immunized should receive 1 dose of PCV13 vaccine. This PCV13 should be followed with a dose of pneumococcal polysaccharide (PPSV23) vaccine. Adults who are at high risk for pneumococcal disease should obtain the PPSV23 vaccine at least 8 weeks after the dose of PCV13 vaccine. Adults older than 60 years of age who have normal immune system function should obtain the PPSV23 vaccine dose at least 1 year after the dose of PCV13 vaccine.  Pneumococcal polysaccharide (PPSV23) vaccine. When PCV13 is also indicated, PCV13 should be obtained first. All adults aged 98 years and older should be immunized. An adult younger than age 15 years who has certain medical conditions should be immunized. Any person who resides in a nursing home or long-term care facility should be immunized. An adult smoker should be immunized. People with an immunocompromised condition and certain other conditions should receive both PCV13 and PPSV23 vaccines. People with human immunodeficiency virus (HIV) infection should be immunized as soon as possible after diagnosis. Immunization during chemotherapy or radiation therapy should be avoided. Routine use of PPSV23 vaccine is not recommended for American Indians, [address], or people younger than 65 years unless there are medical conditions that require PPSV23 vaccine. When indicated, people who have unknown immunization and have no record of immunization should receive PPSV23 vaccine. One-time revaccination 5 years after the first  dose of PPSV23 is recommended for people aged 19-64 years who have chronic kidney failure, nephrotic syndrome, asplenia, or immunocompromised conditions. People who received 1-2 doses of PPSV23 before age 86 years should receive another dose of PPSV23 vaccine at age 61 years or later if at least 5 years have passed since the previous dose. Doses of PPSV23 are not needed for people immunized with PPSV23 at or after age 67 years.  Meningococcal vaccine. Adults  with asplenia or persistent complement component deficiencies should receive 2 doses of quadrivalent meningococcal conjugate (MenACWY-D) vaccine. The doses should be obtained at least 2 months apart. Microbiologists working with certain meningococcal bacteria, [employer] recruits, people at risk during an outbreak, and people who travel to or live in countries with a high rate of meningitis should be immunized. A first-year college student up through age 67 years who is living in a residence hall should receive a dose if he did not receive a dose on or after his 16th birthday. Adults who have certain high-risk conditions should receive one or more doses of vaccine.  Hepatitis A vaccine. Adults who wish to be protected from this disease, have chronic liver disease, work with hepatitis A-infected animals, work in hepatitis A research labs, or travel to or work in countries with a high rate of hepatitis A should be immunized. Adults who were previously unvaccinated and who anticipate close contact with an international adoptee during the first 60 days after arrival in the [place] from a country with a high rate of hepatitis A should be immunized.  Hepatitis B vaccine. Adults should be immunized if they wish to be protected from this disease, are under age 79 years and have diabetes, have chronic liver disease, have had more than one sex partner in the past 6 months, may be exposed to blood or other infectious body fluids, are household contacts or sex  partners of hepatitis B positive people, are clients or workers in certain care facilities, or travel to or work in countries with a high rate of hepatitis B.  Haemophilus influenzae type b (Hib) vaccine. A previously unvaccinated person with asplenia or sickle cell disease or having a scheduled splenectomy should receive 1 dose of Hib vaccine. Regardless of previous immunization, a recipient of a hematopoietic stem cell transplant should receive a 3-dose series 6-12 months after his successful transplant. Hib vaccine is not recommended for adults with HIV infection. Preventive Service / Frequency Ages 36 to 39  Blood pressure check.** / Every 3-5 years.  Lipid and cholesterol check.** / Every 5 years beginning at age 70.  Hepatitis C blood test.** / For any individual with known risks for hepatitis C.  Skin self-exam. / Monthly.  Influenza vaccine. / Every year.  Tetanus, diphtheria, and acellular pertussis (Tdap, Td) vaccine.** / Consult your health care provider. 1 dose of Td every 10 years.  Varicella vaccine.** / Consult your health care provider.  HPV vaccine. / 3 doses over 6 months, if 12 or younger.  Measles, mumps, rubella (MMR) vaccine.** / You need at least 1 dose of MMR if you were born in 1957 or later. You may also need a second dose.  Pneumococcal 13-valent conjugate (PCV13) vaccine.** / Consult your health care provider.  Pneumococcal polysaccharide (PPSV23) vaccine.** / 1 to 2 doses if you smoke cigarettes or if you have certain conditions.  Meningococcal vaccine.** / 1 dose if you are age 36 to 68 years and a Market researcher living in a residence hall, or have one of several medical conditions. You may also need additional booster doses.  Hepatitis A vaccine.** / Consult your health care provider.  Hepatitis B vaccine.** / Consult your health care provider.  Haemophilus influenzae type b (Hib) vaccine.** / Consult your health care provider. Ages 55 to  3  Blood pressure check.** / Every year.  Lipid and cholesterol check.** / Every 5 years beginning at age 53.  Lung cancer screening. / Every year  if you are aged 8-80 years and have a 30-pack-year history of smoking and currently smoke or have quit within the past 15 years. Yearly screening is stopped once you have quit smoking for at least 15 years or develop a health problem that would prevent you from having lung cancer treatment.  Fecal occult blood test (FOBT) of stool. / Every year beginning at age 31 and continuing until age 13. You may not have to do this test if you get a colonoscopy every 10 years.  Flexible sigmoidoscopy** or colonoscopy.** / Every 5 years for a flexible sigmoidoscopy or every 10 years for a colonoscopy beginning at age 65 and continuing until age 39.  Hepatitis C blood test.** / For all people born from [birth year] through 1963 and any individual with known risks for hepatitis C.  Skin self-exam. / Monthly.  Influenza vaccine. / Every year.  Tetanus, diphtheria, and acellular pertussis (Tdap/Td) vaccine.** / Consult your health care provider. 1 dose of Td every 10 years.  Varicella vaccine.** / Consult your health care provider.  Zoster vaccine.** / 1 dose for adults aged 52 years or older.  Measles, mumps, rubella (MMR) vaccine.** / You need at least 1 dose of MMR if you were born in 1955 or later. You may also need a second dose.  Pneumococcal 13-valent conjugate (PCV13) vaccine.** / Consult your health care provider.  Pneumococcal polysaccharide (PPSV23) vaccine.** / 1 to 2 doses if you smoke cigarettes or if you have certain conditions.  Meningococcal vaccine.** / Consult your health care provider.  Hepatitis A vaccine.** / Consult your health care provider.  Hepatitis B vaccine.** / Consult your health care provider.  Haemophilus influenzae type b (Hib) vaccine.** / Consult your health care provider. Ages 19 and over  Blood pressure check.** /  Every year.  Lipid and cholesterol check.**/ Every 5 years beginning at age 50.  Lung cancer screening. / Every year if you are aged 37-80 years and have a 30-pack-year history of smoking and currently smoke or have quit within the past 15 years. Yearly screening is stopped once you have quit smoking for at least 15 years or develop a health problem that would prevent you from having lung cancer treatment.  Fecal occult blood test (FOBT) of stool. / Every year beginning at age 60 and continuing until age 73. You may not have to do this test if you get a colonoscopy every 10 years.  Flexible sigmoidoscopy** or colonoscopy.** / Every 5 years for a flexible sigmoidoscopy or every 10 years for a colonoscopy beginning at age 71 and continuing until age 32.  Hepatitis C blood test.** / For all people born from [birth year] through 1963 and any individual with known risks for hepatitis C.  Abdominal aortic aneurysm (AAA) screening.** / A one-time screening for ages 6 to 42 years who are current or former smokers.  Skin self-exam. / Monthly.  Influenza vaccine. / Every year.  Tetanus, diphtheria, and acellular pertussis (Tdap/Td) vaccine.** / 1 dose of Td every 10 years.  Varicella vaccine.** / Consult your health care provider.  Zoster vaccine.** / 1 dose for adults aged 53 years or older.  Pneumococcal 13-valent conjugate (PCV13) vaccine.** / 1 dose for all adults aged 64 years and older.  Pneumococcal polysaccharide (PPSV23) vaccine.** / 1 dose for all adults aged 58 years and older.  Meningococcal vaccine.** / Consult your health care provider.  Hepatitis A vaccine.** / Consult your health care provider.  Hepatitis B vaccine.** / Consult your health  care provider.  Haemophilus influenzae type b (Hib) vaccine.** / Consult your health care provider. **Family history and personal history of risk and conditions may change your health care provider's recommendations.   This information is not  intended to replace advice given to you by your health care provider. Make sure you discuss any questions you have with your health care provider.   Document Released: 04/17/2001 Document Revised: 03/12/2014 Document Reviewed: 07/17/2010 Elsevier Interactive Patient Education [employer].

## 2016-11-26 NOTE — Progress Notes (Signed)
Male physical  Impression and Recommendations:    1. Encounter for wellness examination   2. Family history of colon cancer in father- in 64's   3. Type 2 diabetes mellitus with microalbuminuria, without long-term current use of insulin ([place])   4. Diabetic nephropathy associated with type 2 diabetes mellitus: Microalbuminuria   5. AIDS ([place])   6. Hypertension associated with diabetes ([place])   7. Mixed diabetic hyperlipidemia associated with type 2 diabetes mellitus ([place])   8. Dyslipidemia with low high density lipoprotein (HDL) cholesterol with hypertriglyceridemia due to type 2 diabetes mellitus ([place])   9. External hemorrhoids without complication   10. Abnormal alkaline phosphatase test   11. Encounter for screening fecal occult blood testing     1) Anticipatory Guidance:   Discussed importance of wearing a seatbelt while driving, not texting while driving;   sunscreen when outside along with skin surveillance; eating a balanced and modest diet;    physical activity at least 25 minutes per day or 150 min/ week moderate to intense activity.  2) Immunizations / Screenings / Labs:  All immunizations are up-to-date per recommendations or will be updated today. Patient is due for dental and vision screens which pt will schedule independently. Will obtain CBC, CMP, HgA1c, Lipid panel, TSH and vit D when fasting, if not already done recently.   3) Weight:  BMI meaning discussed with patient- overwt..  Discussed goal of losing 5-10% of current body weight which would improve overall feelings of well being and improve objective health data. Improve nutrient density of diet through increasing intake of fruits and vegetables and decreasing saturated fats, white flour products and refined sugars.   Follow-up preventative CPE in 1 year. Follow-up office visit pending lab work.   - F/up sooner for chronic care management DM, etc q 4 mo and/or prn    Orders Placed This Encounter  Procedures    . VITAMIN D 25 Hydroxy (Vit-D Deficiency, Fractures)  . TSH  . T4, free  . Phosphorus  . Magnesium  . Lipid panel    Order Specific Question:   Has the patient fasted?    Answer:   Yes  . Comprehensive metabolic panel    Order Specific Question:   Has the patient fasted?    Answer:   Yes  . CBC with Differential/Platelet  . POCT UA - Microalbumin  . POC Hemoccult Bld/Stl (1-Cd Office Dx)     Patient's Medications  New Prescriptions   No medications on file  Previous Medications   ALPRAZOLAM (XANAX) 0.5 MG TABLET    TAKE 1 TABLET BY MOUTH 3 TIMES A DAY AS NEEDED FOR ANXIETY/SLEEP   CETIRIZINE (ZYRTEC) 10 MG TABLET    TAKE 1 TABLET (10 MG TOTAL) BY MOUTH DAILY.   [facility] LANCETS ORIGINAL MISC    1 application by Does not apply route daily.   DESCOVY 200-25 MG TABLET    TAKE ONE TABLET BY MOUTH ONCE DAILY WITH OR WITHOUT FOOD.   GLUCOSE BLOOD (ACCU-CHEK AVIVA) TEST STRIP    Use as instructed   HYDROCHLOROTHIAZIDE (MICROZIDE) 12.5 MG CAPSULE    TAKE ONE CAPSULE BY MOUTH EVERY DAY   ISENTRESS 400 MG TABLET    TAKE ONE TABLET (400 MG) BY MOUTH TWICE DAILY WITH OR WITHOUT FOOD. SWALLOW TABLET WHOLE. STORE AT ROOM TEMPERATURE.   LEVOTHYROXINE (SYNTHROID, LEVOTHROID) 25 MCG TABLET    TAKE 1 TABLET BY MOUTH EVERY DAY ON AN EMPTY STOMACH   LISINOPRIL (PRINIVIL,ZESTRIL) 40  MG TABLET    TAKE 1 TABLET (40 MG TOTAL) BY MOUTH DAILY.   METFORMIN (GLUCOPHAGE) 500 MG TABLET    Take 1 tablet (500 mg total) by mouth 2 (two) times daily with a meal.   PRAVASTATIN (PRAVACHOL) 20 MG TABLET    TAKE 1 TABLET BY MOUTH EVERY DAY  Modified Medications   No medications on file  Discontinued Medications   No medications on file     Please see AVS handed out to patient at the end of our visit for further patient instructions/ counseling done pertaining to today's office visit.    Subjective:    CC: CPE  HPI: Jonathan Estes is a 61 y.o. male who presents to [place] [facility] today  for a yearly health maintenance exam.     Health Maintenance Summary Reviewed and updated, unless pt declines services.  ID -  Dr Tommy Medal- pt UTD on immunizations etc.   Colonoscopy:     05/22/12--> negative scope but has + fam hx--> repeat 5 yrs per Dr Deatra Ina of GI Tdap: Up to date Pneumovax/PPSV23:  UTD Zostavax:    declined Tobacco History Reviewed:   Never smoked CT scan for screening lung CA:  n/a Abdominal Ultrasound:     ( Unnecessary secondary to < 15 or > 20 years old) Alcohol:    No concerns, no excessive use;  none since May Exercise Habits:   Pharmacologist 3d/wk STD concerns:   No sexual activity since Oct 2005 Drug Use:   None since yesterday- marijuana,  Cocaine- none for about 2 yrs.  Birth control method:   n/a Testicular/penile concerns:     No  Immunization History   Hepatitis A   09/13/2008   04/12/2008   Hepatitis B   12/17/2011   02/07/2009   09/13/2008   04/12/2008   Hepatitis B, adult   05/30/2016   01/04/2016   12/01/2015   Influenza Split   12/17/2011   11/27/2010   Influenza Whole   04/17/2010   01/24/2009   12/29/2007   Influenza,inj,Quad PF,6+ Mos   12/01/2015   11/04/2013   11/10/2012   Influenza,inj,Quad PF,6-35 Mos   12/16/2014   Pneumococcal Polysaccharide-23   01/30/2010   10/07/2007   Td   02/07/2009   Tdap   01/29/2010      Diabetic eye exam this Thursday  Lives off of 1370/mo and pays for car at 700/mo.  Health Maintenance  Topic Date Due  . INFLUENZA VACCINE  12/03/2016 (Originally 10/03/2016)  . OPHTHALMOLOGY EXAM  12/03/2016 (Originally 12/30/1965)  . PNEUMOCOCCAL POLYSACCHARIDE VACCINE (2) 03/04/2017 (Originally 10/06/2012)  . HEMOGLOBIN A1C  04/06/2017  . COLONOSCOPY  05/22/2017  . FOOT EXAM  07/23/2017  . TETANUS/TDAP  01/30/2020  . Hepatitis C Screening  Completed  . HIV Screening  Completed      Wt Readings from Last 3 Encounters:  11/26/16 190 lb 1.6 oz (86.2 kg)  10/04/16 194 lb 8 oz (88.2 kg)    07/23/16 205 lb 3.2 oz (93.1 kg)   BP Readings from Last 3 Encounters:  11/26/16 121/85  10/04/16 108/73  07/23/16 112/71   Pulse Readings from Last 3 Encounters:  11/26/16 79  10/04/16 81  07/23/16 76    Patient Active Problem List   Diagnosis Date Noted  . Dyslipidemia with low high density lipoprotein (HDL) cholesterol with hypertriglyceridemia due to type 2 diabetes mellitus ([place]) 07/23/2016    Priority: High  .  Diabetic nephropathy associated with type 2 diabetes mellitus: Microalbuminuria 07/23/2016    Priority: High  . Mixed diabetic hyperlipidemia associated with type 2 diabetes mellitus ([place]) 07/11/2010    Priority: High  . Controlled type 2 diabetes mellitus without complication, without long-term current use of insulin ([place]) 02/07/2009    Priority: High  . Hypertension associated with diabetes ([place]) 04/12/2008    Priority: High  . AIDS ([place]) 07/12/2014    Priority: Medium  . CAD (coronary artery disease) 07/16/2011    Priority: Medium  . Hypothyroidism 04/24/2008    Priority: Medium  . Human immunodeficiency virus (HIV) disease ([place]) 10/14/2007    Priority: Medium  . Environmental and seasonal allergies 07/30/2016    Priority: Low  . DEPRESSION/ANXIETY 06/27/2009    Priority: Low  . h/o COCAINE ABUSE, EPISODIC 10/14/2007    Priority: Low  . CANNABIS ABUSE, HX OF 10/14/2007    Priority: Low  . Family history of colon cancer in father- in 45's 11/26/2016  . External hemorrhoids without complication 03/00/9233  . Dyspnea 05/10/2011  . Abnormal chest CT 04/12/2011  . HTN (hypertension)   . Anxiety 01/29/2011  . PULMONARY NODULE 02/21/2010  . ORBITAL FLOOR , CLOSED FRACTURE 02/21/2010  . UNSPECIFIED VISUAL LOSS 12/26/2009  . TRANSIENT GLOBAL AMNESIA 12/26/2009  . SHOULDER STRAIN 06/13/2009  . FATIGUE 04/12/2008  . METHICILLIN SUSCEPTIBLE STAPH INF CCE & UNS SITE 10/14/2007  . CANDIDIASIS OF MOUTH 10/14/2007  . PNEUMOCYSTIS PNEUMONIA 10/14/2007  .  CUTANEOUS ERUPTIONS, DRUG-INDUCED 10/14/2007    Past Medical History:  Diagnosis Date  . AIDS ([place]) 07/12/2014  . Anxiety   . Bilateral bunions    left foot worse  . Coronary artery disease involving native coronary artery 01/12/2015  . Diabetes mellitus   . Diabetes mellitus type 2, controlled, without complications ([place]) 00/09/6224  . Diabetes type 2, controlled ([place]) 07/12/2014  . HTN (hypertension)   . Human immunodeficiency virus (HIV) disease ([place])   . Hyperlipidemia   . Hypothyroid   . 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 LUNG BIOPSY    . None      Family History  Problem Relation Age of Onset  . Cancer Father        Colon  . Colon cancer Father   . Leukemia Maternal Grandfather     History  Drug Use  . Frequency: 5.0 times per week  . Types: Marijuana    Comment: Smokes marijuana daily!  ,  History  Alcohol Use  . 0.5 oz/week  . 1 Standard drinks or equivalent per week  ,  History  Smoking Status  . Never Smoker  Smokeless Tobacco  . Never Used  ,  History  Sexual Activity  . Sexual activity: Not Currently  . Partners: Female    Patient's Medications  New Prescriptions   No medications on file  Previous Medications   ALPRAZOLAM (XANAX) 0.5 MG TABLET    TAKE 1 TABLET BY MOUTH 3 TIMES A DAY AS NEEDED FOR ANXIETY/SLEEP   CETIRIZINE (ZYRTEC) 10 MG TABLET    TAKE 1 TABLET (10 MG TOTAL) BY MOUTH DAILY.   [facility] LANCETS ORIGINAL MISC    1 application by Does not apply route daily.   DESCOVY 200-25 MG TABLET    TAKE ONE TABLET BY MOUTH ONCE DAILY WITH OR WITHOUT FOOD.   GLUCOSE BLOOD (ACCU-CHEK AVIVA) TEST STRIP    Use as instructed   HYDROCHLOROTHIAZIDE (MICROZIDE)  12.5 MG CAPSULE    TAKE ONE CAPSULE BY MOUTH EVERY DAY   ISENTRESS 400 MG TABLET    TAKE ONE TABLET (400 MG) BY MOUTH TWICE DAILY WITH OR WITHOUT FOOD. SWALLOW TABLET WHOLE. STORE AT ROOM TEMPERATURE.   LEVOTHYROXINE (SYNTHROID, LEVOTHROID)  25 MCG TABLET    TAKE 1 TABLET BY MOUTH EVERY DAY ON AN EMPTY STOMACH   LISINOPRIL (PRINIVIL,ZESTRIL) 40 MG TABLET    TAKE 1 TABLET (40 MG TOTAL) BY MOUTH DAILY.   METFORMIN (GLUCOPHAGE) 500 MG TABLET    Take 1 tablet (500 mg total) by mouth 2 (two) times daily with a meal.   PRAVASTATIN (PRAVACHOL) 20 MG TABLET    TAKE 1 TABLET BY MOUTH EVERY DAY  Modified Medications   No medications on file  Discontinued Medications   No medications on file    Penicillins and Sulfamethoxazole-trimethoprim  Review of Systems: General:   Denies fever, chills, unexplained weight loss.  Optho/Auditory:   Denies visual changes, blurred vision/LOV Respiratory:   Denies SOB, DOE more than baseline levels.  Cardiovascular:   Denies chest pain, palpitations, new onset peripheral edema  Gastrointestinal:   Denies nausea, vomiting, diarrhea.  Genitourinary: Denies dysuria, freq/ urgency, flank pain or discharge from genitals.  Endocrine:     Denies hot or cold intolerance, polyuria, polydipsia. Musculoskeletal:   Denies unexplained myalgias, joint swelling, unexplained arthralgias, gait problems.  Skin:  Denies rash, suspicious lesions Neurological:     Denies dizziness, unexplained weakness, numbness  Psychiatric/Behavioral:   Denies mood changes, suicidal or homicidal ideations, hallucinations    Objective:     Blood pressure 121/85, pulse 79, height 5' 9.5" (1.765 m), weight 190 lb 1.6 oz (86.2 kg). Body mass index is 27.67 kg/m. General Appearance:    Alert, cooperative, no distress, appears stated age  Head:    Normocephalic, without obvious abnormality, atraumatic  Eyes:    PERRL, conjunctiva/corneas clear, EOM's intact, fundi    benign, both eyes  Ears:    Normal TM's and external ear canals, both ears  Nose:   Nares normal, septum midline, mucosa normal, no drainage    or sinus tenderness  Throat:   Lips w/o lesion, mucosa moist, and tongue normal; teeth and gums Poor dentition   Neck:    Supple, symmetrical, trachea midline, no adenopathy;   no carotid bruit or JVD  Back:     Symmetric, no curvature, ROM normal, no CVA tenderness  Lungs:     Clear to auscultation bilaterally, respirations unlabored, no Wh/ R/ R  Chest Wall:    No tenderness or gross deformity; normal excursion   Heart:    Regular rate and rhythm, S1 and S2 normal, no murmur, rub   or gallop  Abdomen:     Soft, non-tender, bowel sounds active all four quadrants, NO G/R/R, no masses, no organomegaly  Genitalia:    Ext genitalia: without lesion, no penile rash or discharge, no hernias appreciated   Rectal:    Normal tone, prostate WNL's and equal b/l, no tenderness; guaiac negative stool  Extremities:   Extremities normal, atraumatic, no cyanosis or gross edema  Pulses:   2+ and symmetric all extremities  Skin:   Warm, dry, Skin color, texture, turgor normal, no obvious rashes  M-Sk:   Ambulates * 4 w/o difficulty, no gross deformities, tone WNL  Neurologic:   CNII-XII intact, normal strength, sensation and reflexes    Throughout Psych:  No HI/SI, judgement and insight good, Euthymic mood. Full Affect.  Recent Results (from the past 2160 hour(s))  HIV 1 RNA quant-no reflex-bld     Status: None   Collection Time: 10/04/16 12:00 AM  Result Value Ref Range   HIV-1 RNA Viral Load <40   Cd4/cd8 (t-helper/t-suppressor cell)     Status: None   Collection Time: 10/04/16 12:00 AM  Result Value Ref Range   CD8 % Suppressor T Cell 46.9    CD8 T Cell Abs 1,126    CD4 Count 660    CD4 % Helper T Cell 27.5   HgB A1c     Status: Abnormal   Collection Time: 10/04/16 10:10 AM  Result Value Ref Range   Hgb A1c MFr Bld 6.0 (H) <5.7 %    Comment:   For someone without known diabetes, a hemoglobin A1c value between 5.7% and 6.4% is consistent with prediabetes and should be confirmed with a follow-up test.   For someone with known diabetes, a value <7% indicates that their diabetes is well controlled. A1c targets  should be individualized based on duration of diabetes, age, co-morbid conditions and other considerations.   This assay result is consistent with an increased risk of diabetes.   Currently, no consensus exists regarding use of hemoglobin A1c for diagnosis of diabetes in children.      Mean Plasma Glucose 126 mg/dL  Lipid panel     Status: Abnormal   Collection Time: 10/04/16 10:10 AM  Result Value Ref Range   Cholesterol 125 <200 mg/dL   Triglycerides 253 (H) <150 mg/dL   HDL 15 (L) >40 mg/dL   Total CHOL/HDL Ratio 8.3 (H) <5.0 Ratio   VLDL 51 (H) <30 mg/dL   LDL Cholesterol 59 <100 mg/dL  Comp Met (CMET)     Status: Abnormal   Collection Time: 10/04/16 10:10 AM  Result Value Ref Range   Sodium 135 135 - 146 mmol/L   Potassium 4.7 3.5 - 5.3 mmol/L   Chloride 98 98 - 110 mmol/L   CO2 24 20 - 31 mmol/L   Glucose, Bld 98 65 - 99 mg/dL   BUN 25 7 - 25 mg/dL   Creat 1.36 (H) 0.70 - 1.33 mg/dL    Comment:   For patients > or = 59 years of age: The upper reference limit for Creatinine is approximately 13% higher for people identified as African-American.      Total Bilirubin 0.3 0.2 - 1.2 mg/dL   Alkaline Phosphatase 117 (H) 40 - 115 U/L   AST 16 10 - 35 U/L   ALT 13 9 - 46 U/L   Total Protein 8.0 6.1 - 8.1 g/dL   Albumin 4.0 3.6 - 5.1 g/dL   Calcium 9.5 8.6 - 10.3 mg/dL  RPR     Status: None   Collection Time: 10/04/16 10:10 AM  Result Value Ref Range   RPR Ser Ql NON REAC NON REAC  Protein, urine, random     Status: Abnormal   Collection Time: 10/04/16  2:45 PM  Result Value Ref Range   Total Protein, Urine 28 (H) 5 - 25 mg/dL  Creatinine, Urine     Status: None   Collection Time: 10/04/16  2:45 PM  Result Value Ref Range   Creatinine, Urine 135 20 - 370 mg/dL  POC Hemoccult Bld/Stl (1-Cd Office Dx)     Status: Normal   Collection Time: 11/26/16  9:12 AM  Result Value Ref Range   Card #1 Date 11/26/2016    Fecal Occult Blood, POC Negative  Negative

## 2016-11-27 LAB — COMPREHENSIVE METABOLIC PANEL WITH GFR
ALT: 9 IU/L (ref 0–44)
AST: 15 IU/L (ref 0–40)
Albumin/Globulin Ratio: 1.2 (ref 1.2–2.2)
Albumin: 4.5 g/dL (ref 3.5–5.5)
Alkaline Phosphatase: 123 IU/L — ABNORMAL HIGH (ref 39–117)
BUN/Creatinine Ratio: 10 (ref 9–20)
BUN: 13 mg/dL (ref 6–24)
Bilirubin Total: <0.2 mg/dL (ref 0.0–1.2)
CO2: 26 mmol/L (ref 20–29)
Calcium: 9.8 mg/dL (ref 8.7–10.2)
Chloride: 99 mmol/L (ref 96–106)
Creatinine, Ser: 1.29 mg/dL — ABNORMAL HIGH (ref 0.76–1.27)
GFR calc Af Amer: 70 mL/min/1.73
GFR calc non Af Amer: 61 mL/min/1.73
Globulin, Total: 3.8 g/dL (ref 1.5–4.5)
Glucose: 109 mg/dL — ABNORMAL HIGH (ref 65–99)
Potassium: 4.6 mmol/L (ref 3.5–5.2)
Sodium: 138 mmol/L (ref 134–144)
Total Protein: 8.3 g/dL (ref 6.0–8.5)

## 2016-11-27 LAB — PHOSPHORUS: Phosphorus: 3.5 mg/dL (ref 2.5–4.5)

## 2016-11-27 LAB — LIPID PANEL
Chol/HDL Ratio: 5 ratio (ref 0.0–5.0)
Cholesterol, Total: 130 mg/dL (ref 100–199)
HDL: 26 mg/dL — ABNORMAL LOW
LDL Calculated: 61 mg/dL (ref 0–99)
Triglycerides: 215 mg/dL — ABNORMAL HIGH (ref 0–149)
VLDL Cholesterol Cal: 43 mg/dL — ABNORMAL HIGH (ref 5–40)

## 2016-11-27 LAB — TSH: TSH: 4.88 u[IU]/mL — ABNORMAL HIGH (ref 0.450–4.500)

## 2016-11-27 LAB — CBC WITH DIFFERENTIAL/PLATELET
Basophils Absolute: 0.1 x10E3/uL (ref 0.0–0.2)
Basos: 1 %
EOS (ABSOLUTE): 0.3 x10E3/uL (ref 0.0–0.4)
Eos: 4 %
Hematocrit: 39.1 % (ref 37.5–51.0)
Hemoglobin: 13.6 g/dL (ref 13.0–17.7)
Immature Grans (Abs): 0 x10E3/uL (ref 0.0–0.1)
Immature Granulocytes: 0 %
Lymphocytes Absolute: 2.1 x10E3/uL (ref 0.7–3.1)
Lymphs: 28 %
MCH: 30.8 pg (ref 26.6–33.0)
MCHC: 34.8 g/dL (ref 31.5–35.7)
MCV: 89 fL (ref 79–97)
Monocytes Absolute: 0.6 x10E3/uL (ref 0.1–0.9)
Monocytes: 8 %
Neutrophils Absolute: 4.4 x10E3/uL (ref 1.4–7.0)
Neutrophils: 59 %
Platelets: 278 x10E3/uL (ref 150–379)
RBC: 4.42 x10E6/uL (ref 4.14–5.80)
RDW: 14.5 % (ref 12.3–15.4)
WBC: 7.5 x10E3/uL (ref 3.4–10.8)

## 2016-11-27 LAB — MAGNESIUM: Magnesium: 1.7 mg/dL (ref 1.6–2.3)

## 2016-11-27 LAB — VITAMIN D 25 HYDROXY (VIT D DEFICIENCY, FRACTURES): Vit D, 25-Hydroxy: 20.8 ng/mL — ABNORMAL LOW (ref 30.0–100.0)

## 2016-11-27 LAB — T4, FREE: Free T4: 1.23 ng/dL (ref 0.82–1.77)

## 2016-11-28 ENCOUNTER — Other Ambulatory Visit: Payer: Self-pay

## 2016-11-28 ENCOUNTER — Telehealth: Payer: Self-pay | Admitting: Family Medicine

## 2016-11-28 DIAGNOSIS — E039 Hypothyroidism, unspecified: Secondary | ICD-10-CM

## 2016-11-28 DIAGNOSIS — E119 Type 2 diabetes mellitus without complications: Secondary | ICD-10-CM

## 2016-11-28 DIAGNOSIS — I10 Essential (primary) hypertension: Principal | ICD-10-CM

## 2016-11-28 DIAGNOSIS — B20 Human immunodeficiency virus [HIV] disease: Secondary | ICD-10-CM

## 2016-11-28 DIAGNOSIS — E1159 Type 2 diabetes mellitus with other circulatory complications: Secondary | ICD-10-CM

## 2016-11-28 DIAGNOSIS — E7849 Other hyperlipidemia: Secondary | ICD-10-CM

## 2016-11-28 NOTE — Telephone Encounter (Signed)
Patient called requesting medication refills, last filled by previous provider sent to Dr. Sharee Holster to review and sign.  MPulliam, CMA/RT(R)

## 2016-11-28 NOTE — Telephone Encounter (Signed)
I did not communicate to patient that I was going to call in anything to the pharmacy for him re: jock itch or hemorrhoids.   Pt was seen for a yearly physical exam   I discussed with patient over-the-counter medications for his external hemorrhoids if they flare- patient did not have any hemorrhoids upon examination.  Patient also did not c/o current symptoms when he was recently seen.  I told him to use OTC Preparation H, if they get swollen and irritated, and if that was not effective, to call and let us know so we can evaluate/see him for these symptoms   Also, regarding the " jock itch ", I discussed over-the-counter medications with the patient during his office visit for this.    -Patient did not ask for medication refills when he was here- it was not an OV for assessment of his chronic medical problems.  Those medications are fine to refill #90 day supply, no RF til seen again to review and discuss mgt of his chronic problems.   Thank you Melissa.

## 2016-11-28 NOTE — Telephone Encounter (Signed)
Patient states on 9/24 when in for OV he told provider he had a case of  Jock itch & hemorrhoids and thought she was going to [facility] [place], [place]-- Pt says [facility] ---  Patient also states he is about out of (4) diff Rxs and request [facility] as [facility]. 1) Pravastatin 20 MG (takes 1 daily) 2) Levothyroxine 25 MG (takes 1 daily) 3) Metformin 500 MG  (takes 1 tablet two times daily) 4) hydrochlorothiazide 12.5 MG (takes 1 daily) --pls cll patient if questions ( most of these from prior provider). --glh

## 2016-11-28 NOTE — Telephone Encounter (Signed)
Please advise on the RX for hemorrhoids and jock itch.  I sent over refill request for the other medication as pending for you because they were last filled by a previous provider.  MPulliam, CMA/RT(R)

## 2016-11-28 NOTE — Telephone Encounter (Signed)
Patient needs follow-up appointment with me to discuss lab abnormalities and dose changes may be necessary.  Only refill 30 day supply of these medicines, no further refill until seen and addressed with pt

## 2016-11-29 ENCOUNTER — Other Ambulatory Visit: Payer: Self-pay | Admitting: Infectious Disease

## 2016-11-29 ENCOUNTER — Ambulatory Visit (INDEPENDENT_AMBULATORY_CARE_PROVIDER_SITE_OTHER): Payer: Medicare Other

## 2016-11-29 ENCOUNTER — Other Ambulatory Visit: Payer: Self-pay

## 2016-11-29 VITALS — Temp 98.4°F

## 2016-11-29 DIAGNOSIS — I10 Essential (primary) hypertension: Secondary | ICD-10-CM

## 2016-11-29 DIAGNOSIS — F419 Anxiety disorder, unspecified: Secondary | ICD-10-CM

## 2016-11-29 DIAGNOSIS — Z23 Encounter for immunization: Secondary | ICD-10-CM

## 2016-11-29 DIAGNOSIS — E038 Other specified hypothyroidism: Secondary | ICD-10-CM

## 2016-11-29 LAB — HM DIABETES EYE EXAM

## 2016-11-29 MED ORDER — PRAVASTATIN SODIUM 20 MG PO TABS: 20.0000 mg | ORAL_TABLET | Freq: Every day | ORAL | 0 refills | Status: DC

## 2016-11-29 MED ORDER — METFORMIN HCL 500 MG PO TABS: 500.0000 mg | ORAL_TABLET | Freq: Two times a day (BID) | ORAL | 0 refills | Status: DC

## 2016-11-29 MED ORDER — HYDROCHLOROTHIAZIDE 12.5 MG PO CAPS: 12.5000 mg | ORAL_CAPSULE | Freq: Every day | ORAL | 0 refills | Status: DC

## 2016-11-29 MED ORDER — LEVOTHYROXINE SODIUM 25 MCG PO TABS: ORAL_TABLET | ORAL | 0 refills | Status: DC

## 2016-11-29 NOTE — Telephone Encounter (Signed)
Approved for refill by Dr. Sharee Holster. MPulliam, CMA/RT(R)

## 2016-11-29 NOTE — Telephone Encounter (Signed)
Spoke to the patient when he came in for flu shot and notified he of the message from Dr. Sharee Holster he expressed understanding.  Patient states that he is not sure on which medications he needs refills on at this time, he will call me once he is home and can look at his medications.  MPulliam, CMA/RT(R)

## 2016-11-30 ENCOUNTER — Other Ambulatory Visit: Payer: Self-pay | Admitting: *Deleted

## 2016-11-30 DIAGNOSIS — F419 Anxiety disorder, unspecified: Secondary | ICD-10-CM

## 2016-11-30 MED ORDER — ALPRAZOLAM 0.5 MG PO TABS: ORAL_TABLET | ORAL | 0 refills | Status: DC

## 2016-12-03 ENCOUNTER — Other Ambulatory Visit: Payer: Self-pay | Admitting: Family Medicine

## 2016-12-03 ENCOUNTER — Other Ambulatory Visit: Payer: Self-pay | Admitting: Infectious Disease

## 2016-12-03 DIAGNOSIS — E038 Other specified hypothyroidism: Secondary | ICD-10-CM

## 2016-12-03 DIAGNOSIS — I10 Essential (primary) hypertension: Secondary | ICD-10-CM

## 2016-12-20 ENCOUNTER — Other Ambulatory Visit: Payer: Self-pay | Admitting: Infectious Disease

## 2016-12-20 DIAGNOSIS — Z006 Encounter for examination for normal comparison and control in clinical research program: Secondary | ICD-10-CM

## 2016-12-20 DIAGNOSIS — Z21 Asymptomatic human immunodeficiency virus [HIV] infection status: Secondary | ICD-10-CM

## 2016-12-28 ENCOUNTER — Other Ambulatory Visit: Payer: Self-pay | Admitting: Family Medicine

## 2016-12-28 ENCOUNTER — Other Ambulatory Visit: Payer: Self-pay | Admitting: Infectious Disease

## 2016-12-28 DIAGNOSIS — B20 Human immunodeficiency virus [HIV] disease: Secondary | ICD-10-CM

## 2016-12-28 DIAGNOSIS — E119 Type 2 diabetes mellitus without complications: Secondary | ICD-10-CM

## 2016-12-30 DIAGNOSIS — E786 Lipoprotein deficiency: Secondary | ICD-10-CM | POA: Insufficient documentation

## 2016-12-30 DIAGNOSIS — E781 Pure hyperglyceridemia: Secondary | ICD-10-CM | POA: Insufficient documentation

## 2016-12-30 NOTE — Progress Notes (Signed)
Assessment and plan:  1. Diabetic nephropathy associated with type 2 diabetes mellitus: Microalbuminuria   2. Controlled type 2 diabetes mellitus without complication, without long-term current use of insulin ([place])   3. Hypertension associated with diabetes ([place])   4. Mixed diabetic hyperlipidemia associated with type 2 diabetes mellitus ([place])   5. Other specified hypothyroidism   6. Low level of high density lipoprotein (HDL)   7. Hypertriglyceridemia   8. Microalbuminuria due to type 2 diabetes mellitus ([place])   9. Vitamin D deficiency   10. Other hyperlipidemia      Meds ordered this encounter  Medications  . DISCONTD: pravastatin (PRAVACHOL) 20 MG tablet    Sig: Take 1 tablet (20 mg total) by mouth at bedtime.    Dispense:  90 tablet    Refill:  0  . DISCONTD: gemfibrozil (LOPID) 600 MG tablet    Sig: 1/2 tab twice daily    Dispense:  90 tablet    Refill:  1    Discontinued Medications   ALPRAZOLAM (XANAX) 0.5 MG TABLET    TAKE 1 TABLET BY MOUTH 3 TIMES A DAY AS NEEDED FOR ANXIETY/SLEEP   DESCOVY 200-25 MG TABLET    TAKE ONE TABLET BY MOUTH ONCE DAILY WITH OR WITHOUT FOOD.   GEMFIBROZIL (LOPID) 600 MG TABLET    1/2 tab twice daily   HYDROCHLOROTHIAZIDE (MICROZIDE) 12.5 MG CAPSULE    Take 1 capsule (12.5 mg total) by mouth daily.   ISENTRESS 400 MG TABLET    TAKE ONE TABLET (400 MG) BY MOUTH TWICE DAILY WITH OR WITHOUT FOOD. SWALLOW TABLET WHOLE. STORE AT ROOM TEMPERATURE.   LEVOTHYROXINE (SYNTHROID, LEVOTHROID) 25 MCG TABLET    TAKE 1 TABLET BY MOUTH EVERY DAY ON AN EMPTY STOMACH   METFORMIN (GLUCOPHAGE) 500 MG TABLET    TAKE 1 TABLET (500 MG TOTAL) BY MOUTH 2 (TWO) TIMES DAILY WITH A MEAL.   PRAVASTATIN (PRAVACHOL) 20 MG TABLET    Take 1 tablet (20 mg total) by mouth daily.   PRAVASTATIN (PRAVACHOL) 20 MG TABLET    Take 1 tablet (20 mg total) by mouth at bedtime.    Modified Medications   Modified  Medication Previous Medication   ALPRAZOLAM (XANAX) 0.5 MG TABLET ALPRAZolam (XANAX) 0.5 MG tablet      TAKE 1 TABLET BY MOUTH 3 TIMES A DAY AS NEEDED FOR ANXIETY    TAKE 1 TABLET BY MOUTH 3 TIMES A DAY AS NEEDED FOR ANXIETY   CHOLECALCIFEROL (VITAMIN D3) 5000 UNITS CAPS Cholecalciferol (VITAMIN D3) 5000 units CAPS      Take 1 capsule (5,000 Units total) by mouth daily.    Take 1 capsule by mouth.   HYDROCHLOROTHIAZIDE (MICROZIDE) 12.5 MG CAPSULE hydrochlorothiazide (MICROZIDE) 12.5 MG capsule      Take 1 capsule (12.5 mg total) by mouth daily.    TAKE 1 CAPSULE BY MOUTH EVERY DAY   LEVOTHYROXINE (SYNTHROID, LEVOTHROID) 25 MCG TABLET levothyroxine (SYNTHROID, LEVOTHROID) 25 MCG tablet      TAKE 1 TABLET BY MOUTH EVERY DAY ON AN EMPTY STOMACH    TAKE 1 TABLET BY MOUTH EVERY DAY ON AN EMPTY STOMACH   LISINOPRIL (PRINIVIL,ZESTRIL) 40 MG TABLET lisinopril (PRINIVIL,ZESTRIL) 40 MG tablet      Take 1 tablet (40 mg total) by mouth daily.    TAKE 1 TABLET BY MOUTH EVERY DAY   METFORMIN (GLUCOPHAGE) 500 MG TABLET metFORMIN (GLUCOPHAGE) 500 MG tablet      Take 1  tablet (500 mg total) by mouth 2 (two) times daily with a meal.    Take 1 tablet (500 mg total) by mouth 2 (two) times daily with a meal.   PRAVASTATIN (PRAVACHOL) 20 MG TABLET pravastatin (PRAVACHOL) 20 MG tablet      TAKE 1 TABLET BY MOUTH EVERYDAY AT BEDTIME    TAKE 1 TABLET BY MOUTH EVERYDAY AT BEDTIME    Synthroid or levothyroxine is a medicine you should take totally separate from any other medicines even including vitamins.  It should be taking completely on its own and on an empty stomach and wait at least a couple hours before you take any other medicines.   Anticipatory guidance and routine counseling done re: condition, txmnt options and need for follow up. All questions of patient's were answered.   Gross side effects, risk and benefits, and alternatives of medications discussed with patient.  Patient is aware that all medications  have potential side effects and we are unable to predict every sideeffect or drug-drug interaction that may occur.  Expresses verbal understanding and consents to current therapy plan and treatment regiment.  Pt was in the office today for 25+ minutes, with over 50% time spent in face to face counseling of patient's various medical conditions and in coordination of care  Please see AVS handed out to patient at the end of our visit for additional patient instructions/ counseling done pertaining to today's office visit.  Note: This document was prepared using Dragon voice recognition software and may include unintentional dictation errors.   ----------------------------------------------------------------------------------------------------------------------  Subjective:   CC:   Jonathan Estes is a 61 y.o. male who presents to [place] at [facility] today for review and discussion of recent bloodwork that was done.  1. All recent blood work that we ordered was reviewed with patient today.  Patient was counseled on all abnormalities and we discussed dietary and lifestyle changes that could help those values (also medications when appropriate).  Extensive health counseling performed and all patient's concerns/ questions were addressed.  2. Started vit D like he was told to. Told well.   3. Hypothyroidism:  Taking all meds together.  Not separate- may have variable effects due to this.  Pt denies Sx at all.  Feeling well   Wt Readings from Last 3 Encounters:  04/15/17 192 lb (87.1 kg)  04/04/17 190 lb (86.2 kg)  03/21/17 190 lb (86.2 kg)   BP Readings from Last 3 Encounters:  04/15/17 120/77  04/04/17 (!) 149/84  03/23/17 118/74   Pulse Readings from Last 3 Encounters:  04/15/17 67  04/04/17 92  03/23/17 77   BMI Readings from Last 3 Encounters:  04/15/17 28.35 kg/m  04/04/17 28.06 kg/m  03/21/17 28.06 kg/m     Patient Care Team    Relationship Specialty  Notifications Start End  Mellody Dance, DO PCP - General Family Medicine  11/26/16   Inda Castle, MD (Inactive) Consulting Physician Gastroenterology  07/23/16   Tommy Medal, Lavell Islam, MD Consulting Physician Infectious Diseases  12/31/16    Comment: HIV doc  Beverely Low, OD Referring Physician Optometry  12/31/16     Full medical history updated and reviewed in the office today  Patient Active Problem List   Diagnosis Date Noted  . 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 Uveitis 03/21/2017  . Change in vision 03/21/2017  . Microalbuminuria due to type 2 diabetes mellitus ([place]) 12/31/2016  . Vitamin D deficiency 12/31/2016  . Low level  of high density lipoprotein (HDL) 12/30/2016  . Hypertriglyceridemia 12/30/2016  . Family history of colon cancer in father- in 18's 11/26/2016  . External hemorrhoids without complication 23/30/0762  . Environmental and seasonal allergies 07/30/2016  . Dyslipidemia with low high density lipoprotein (HDL) cholesterol with hypertriglyceridemia due to type 2 diabetes mellitus ([place]) 07/23/2016  . Diabetic nephropathy associated with type 2 diabetes mellitus: Microalbuminuria 07/23/2016  . CAD (coronary artery disease) 07/16/2011  . Dyspnea 05/10/2011  . Abnormal chest CT 04/12/2011  . HTN (hypertension)   . Anxiety 01/29/2011  . Mixed diabetic hyperlipidemia associated with type 2 diabetes mellitus ([place]) 07/11/2010  . PULMONARY NODULE 02/21/2010  . ORBITAL FLOOR , CLOSED FRACTURE 02/21/2010  . UNSPECIFIED VISUAL LOSS 12/26/2009  . TRANSIENT GLOBAL AMNESIA 12/26/2009  . DEPRESSION/ANXIETY 06/27/2009  . SHOULDER STRAIN 06/13/2009  . Controlled type 2 diabetes mellitus without complication, without long-term current use of insulin ([place]) 02/07/2009  . Hypothyroidism 04/24/2008  . Hypertension associated with diabetes ([place]) 04/12/2008  . FATIGUE 04/12/2008  . METHICILLIN SUSCEPTIBLE STAPH INF CCE & UNS SITE 10/14/2007  . Human immunodeficiency virus  (HIV) disease ([place]) 10/14/2007  . CANDIDIASIS OF MOUTH 10/14/2007  . PNEUMOCYSTIS PNEUMONIA 10/14/2007  . h/o COCAINE ABUSE, EPISODIC 10/14/2007  . CUTANEOUS ERUPTIONS, DRUG-INDUCED 10/14/2007  . CANNABIS ABUSE, HX OF 10/14/2007    Past Medical History:  Diagnosis Date  . AIDS ([place]) 07/12/2014  . Anxiety   . Arthritis    "hips, shoulders" (03/21/2017)  . Bilateral bunions    left foot worse  . Coronary artery disease involving native coronary artery 01/12/2015  . Diabetes mellitus type 2, controlled, without complications ([place]) 26/05/3352  . Diabetes type 2, controlled ([place]) 07/12/2014  . High triglycerides   . HTN (hypertension)   . Human immunodeficiency virus (HIV) disease ([place]) d'x 09/2007  . Hyperlipidemia   . Hypothyroid   . 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 BRONCHOSCOPY  09/29/2007   Bronchoalveolar lavage was  obtained from the left lower lobe.  Under fluoroscopy, transbronchial Archie Endo 07/04/2010  . CONDYLOMA EXCISION/FULGURATION  ~ 1996   anal/notes 07/04/2010    Social History   Tobacco Use  . Smoking status: Never Smoker  . Smokeless tobacco: Never Used  Substance Use Topics  . Alcohol use: Yes    Comment: 03/21/2017 "none since 01/12/2017"    Family Hx: Family History  Problem Relation Age of Onset  . Colon cancer Father   . Leukemia Maternal Grandfather      Medications: Current Outpatient Medications  Medication Sig Dispense Refill  . cetirizine (ZYRTEC) 10 MG tablet TAKE 1 TABLET (10 MG TOTAL) BY MOUTH DAILY. 90 tablet 2  . [facility] LANCETS ORIGINAL MISC 1 application by Does not apply route daily. 90 each 4  . glucose blood (ACCU-CHEK AVIVA) test strip Use as instructed 100 each 12  . ALPRAZolam (XANAX) 0.5 MG tablet TAKE 1 TABLET BY MOUTH 3 TIMES A DAY AS NEEDED FOR ANXIETY 90 tablet 0  . bictegravir-emtricitabine-tenofovir AF (BIKTARVY) 50-200-25 MG TABS tablet  Take 1 tablet by mouth daily. 30 tablet 11  . Cholecalciferol (VITAMIN D3) 5000 units CAPS Take 1 capsule (5,000 Units total) by mouth daily. 120 capsule 4  . gemfibrozil (LOPID) 600 MG tablet 1/2 tab twice daily 90 tablet 1  . hydrochlorothiazide (MICROZIDE) 12.5 MG capsule Take 1 capsule (12.5 mg total) by mouth daily. 90 capsule 1  . levothyroxine (  SYNTHROID, LEVOTHROID) 25 MCG tablet TAKE 1 TABLET BY MOUTH EVERY DAY ON AN EMPTY STOMACH 90 tablet 1  . lisinopril (PRINIVIL,ZESTRIL) 40 MG tablet Take 1 tablet (40 mg total) by mouth daily. 90 tablet 1  . metFORMIN (GLUCOPHAGE) 500 MG tablet Take 1 tablet (500 mg total) by mouth 2 (two) times daily with a meal. 180 tablet 1  . Multiple Vitamins-Minerals (ICAPS AREDS 2 PO) Take by mouth.    . pravastatin (PRAVACHOL) 20 MG tablet TAKE 1 TABLET BY MOUTH EVERYDAY AT BEDTIME 90 tablet 1  . prednisoLONE acetate (PRED FORTE) 1 % ophthalmic suspension Place 1 drop into the right eye 4 (four) times daily. 10 mL 0  . zinc gluconate 50 MG tablet Take 50 mg by mouth daily.     No current facility-administered medications for this visit.     Allergies:  Allergies  Allergen Reactions  . Sulfamethoxazole-Trimethoprim     REACTION: severe rash     Review of Systems: General:   No F/C, wt loss Pulm:   No DIB, SOB, pleuritic chest pain Card:  No CP, palpitations Abd:  No n/v/d or pain Ext:  No inc edema from baseline  Objective:  Blood pressure 109/73, pulse 79, height 5' 9.5" (1.765 m), weight 200 lb (90.7 kg). Body mass index is 29.11 kg/m. Gen:   Well NAD, A and O *3 HEENT:    [place]/AT, EOMI,  MMM Lungs:   Normal work of breathing. CTA B/L, no Wh, rhonchi Heart:   RRR, S1, S2 WNL's, no MRG Abd:   No gross distention Exts:    warm, pink,  Brisk capillary refill, warm and well perfused.  Psych:    No HI/SI, judgement and insight good, Euthymic mood. Full Affect.   Recent Results (from the past 2160 hour(s))  HIV 1 RNA quant-no reflex-bld      Status: None   Collection Time: 02/06/17 12:00 AM  Result Value Ref Range   HIV-1 RNA Viral Load <40   Cd4/cd8 (t-helper/t-suppressor cell)     Status: None   Collection Time: 02/06/17 12:00 AM  Result Value Ref Range   CD4 % Helper T Cell 25.4    CD4 Count 813    CD8 T Cell Abs 1,498    CD8 % Suppressor T Cell 46.8   Comp Met (CMET)     Status: None   Collection Time: 02/06/17 10:20 AM  Result Value Ref Range   Glucose, Bld 93 65 - 99 mg/dL    Comment: .            Fasting reference interval .    BUN 16 7 - 25 mg/dL   Creat 1.03 0.70 - 1.33 mg/dL    Comment: For patients >58 years of age, the reference limit for Creatinine is approximately 13% higher for people identified as African-American. .    BUN/Creatinine Ratio NOT APPLICABLE 6 - 22 (calc)   Sodium 136 135 - 146 mmol/L   Potassium 4.2 3.5 - 5.3 mmol/L   Chloride 99 98 - 110 mmol/L   CO2 28 20 - 32 mmol/L   Calcium 9.7 8.6 - 10.3 mg/dL   Total Protein 7.7 6.1 - 8.1 g/dL   Albumin 4.3 3.6 - 5.1 g/dL   Globulin 3.4 1.9 - 3.7 g/dL (calc)   AG Ratio 1.3 1.0 - 2.5 (calc)   Total Bilirubin 0.4 0.2 - 1.2 mg/dL   Alkaline phosphatase (APISO) 96 40 - 115 U/L   AST 16 10 -  35 U/L   ALT 11 9 - 46 U/L  Hepatitis C Antibody     Status: None   Collection Time: 02/06/17 10:20 AM  Result Value Ref Range   Hepatitis C Ab NON-REACTIVE NON-REACTI   SIGNAL TO CUT-OFF 0.09 <1.00  C-reactive protein     Status: None   Collection Time: 03/21/17 10:37 AM  Result Value Ref Range   CRP 4.2 <8.0 mg/L  Lysozyme, serum     Status: None   Collection Time: 03/21/17 10:37 AM  Result Value Ref Range   Lysozyme, Serum 8.5 5.0 - 11.0 mcg/mL    Comment: . This test was developed and its analytical performance characteristics have been determined by [employer], [place], [place]. It has not been cleared or approved by the FDA. This assay has been validated pursuant to the CLIA regulations and is used for clinical  purposes. .   RPR     Status: Abnormal   Collection Time: 03/21/17 10:37 AM  Result Value Ref Range   RPR Ser Ql REACTIVE (A) NON-REACTI  Fluorescent treponemal ab(fta)-IgG-bld     Status: Abnormal   Collection Time: 03/21/17 10:37 AM  Result Value Ref Range   Fluorescent Treponemal ABS REACTIVE (A) NON-REACTI  Angiotensin converting enzyme     Status: Abnormal   Collection Time: 03/21/17 10:37 AM  Result Value Ref Range   Angiotensin-Converting Enzyme 5 (L) 9 - 67 U/L    Comment: Verified by repeat analysis. .   Sedimentation rate     Status: Abnormal   Collection Time: 03/21/17 10:37 AM  Result Value Ref Range   Sed Rate 34 (H) 0 - 20 mm/h  Comp Met (CMET)     Status: Abnormal   Collection Time: 03/21/17 10:37 AM  Result Value Ref Range   Glucose, Bld 113 (H) 65 - 99 mg/dL    Comment: .            Fasting reference interval . For someone without known diabetes, a glucose value between 100 and 125 mg/dL is consistent with prediabetes and should be confirmed with a follow-up test. .    BUN 16 7 - 25 mg/dL   Creat 0.99 0.70 - 1.33 mg/dL    Comment: For patients >23 years of age, the reference limit for Creatinine is approximately 13% higher for people identified as African-American. .    BUN/Creatinine Ratio NOT APPLICABLE 6 - 22 (calc)   Sodium 137 135 - 146 mmol/L   Potassium 4.1 3.5 - 5.3 mmol/L   Chloride 99 98 - 110 mmol/L   CO2 26 20 - 32 mmol/L   Calcium 10.0 8.6 - 10.3 mg/dL   Total Protein 8.2 (H) 6.1 - 8.1 g/dL   Albumin 4.9 3.6 - 5.1 g/dL   Globulin 3.3 1.9 - 3.7 g/dL (calc)   AG Ratio 1.5 1.0 - 2.5 (calc)   Total Bilirubin 0.4 0.2 - 1.2 mg/dL   Alkaline phosphatase (APISO) 109 40 - 115 U/L   AST 18 10 - 35 U/L   ALT 16 9 - 46 U/L  CBC     Status: Abnormal   Collection Time: 03/21/17 10:37 AM  Result Value Ref Range   WBC 13.4 (H) 3.8 - 10.8 Thousand/uL   RBC 4.86 4.20 - 5.80 Million/uL   Hemoglobin 15.6 13.2 - 17.1 g/dL   HCT 43.9 38.5 - 50.0 %    MCV 90.3 80.0 - 100.0 fL   MCH 32.1 27.0 - 33.0 pg  MCHC 35.5 32.0 - 36.0 g/dL   RDW 13.0 11.0 - 15.0 %   Platelets 295 140 - 400 Thousand/uL   MPV 9.5 7.5 - 12.5 fL  VDRL, Serum     Status: Abnormal   Collection Time: 03/21/17 10:37 AM  Result Value Ref Range   VDRL, Serum see note (A)     Comment: Reactive 1:128 Dilution . Reference range:  NONREACTIVE . The VDRL is a non-treponemal-specific test; therefore, a treponemal-specific confirmatory test should be performed unless prior syphilis infection has been documented for this patient. Marland Kitchen   Rpr titer     Status: Abnormal   Collection Time: 03/21/17 10:37 AM  Result Value Ref Range   RPR Titer 1:64 (H)   T-helper cell (CD4)- ([facility] clinic only)     Status: Abnormal   Collection Time: 03/21/17 11:33 AM  Result Value Ref Range   CD4 T Cell Abs 440 400 - 2,700 /uL   CD4 % Helper T Cell 14 (L) 33 - 55 %    Comment: Performed at [facility], [place] [address], [place], [place] [address]  QuantiFERON-TB [place] Rm Plus     Status: None   Collection Time: 03/21/17 12:38 PM  Result Value Ref Range   QuantiFERON-TB Gold Plus NEGATIVE NEGATIVE    Comment: Negative test result. M. tuberculosis complex  infection unlikely.    NIL 0.05 IU/mL   Mitogen-NIL >10.00 IU/mL   TB1-NIL <0.00 IU/mL   TB2-NIL <0.00 IU/mL    Comment: . The Nil tube value reflects the background interferon gamma immune response of the patient's blood sample. This value has been subtracted from the patient's displayed TB and Mitogen results. . Lower than expected results with the Mitogen tube prevent false-negative Quantiferon readings by detecting a patient with a potential immune suppressive condition and/or suboptimal pre-analytical specimen handling. . The TB1 Antigen tube is coated with the M. tuberculosis-specific antigens designed to elicit responses from TB antigen primed CD4+ helper T-lymphocytes. . The TB2 Antigen tube is  coated with the M. tuberculosis-specific antigens designed to elicit responses from TB antigen primed CD4+ helper and CD8+ cytotoxic T-lymphocytes. . For additional information, please refer to http://education.questdiagnostics.com/faq/204 (This link is being provided for informational/ educational purposes only.) .   HIV 1 RNA quant-no reflex-bld     Status: Abnormal   Collection Time: 03/21/17  3:43 PM  Result Value Ref Range   HIV 1 RNA Quant 26 (H) NOT DETECT copies/mL   HIV-1 RNA Quant, Log 1.41 (H) NOT DETECT Log copies/mL    Comment: . This test was performed using Real-Time Polymerase Chain Reaction. . Reportable Range: 20 copies/mL to 10,000,000 copies/mL (1.30 log copies/mL to 7.00 log copies/mL).   HLA A,B,C Low Resolution     Status: None   Collection Time: 03/21/17  3:44 PM  Result Value Ref Range   A-1 A*02    A-2 A*29    A-1 NMDP DATA NOT REPORTED    A-2 NMDP DATA NOT REPORTED    A-1 EQUIVALENT A2    A-2 EQUIVALENT A29    B-1 B*07    B-2 B*40    B-1 NMDP DATA NOT REPORTED    B-2 NMDP DATA NOT REPORTED    B-1 EQUIVALENT DATA NOT REPORTED    B-2 EQUIVALENT DATA NOT REPORTED    C-1 C*03    C-2 C*07    C-1 NMDP DATA NOT REPORTED    C-2 NMDP DATA NOT REPORTED    C-1 EQUIVALENT C10  C-2 EQUIVALENT C7    Test Method PCR SSOP    Bw-1 Bw6    Bw-2 Bw6    Histocompatibility Laboratory Information See Below     Comment: [facility]SHI:  03-1-MA-12-1      Director:  Vevelyn Royals, MD. CLIA:  [id number]        Director:  Ruben Gottron, PhD   [place] PFI:  [id number] UNOS:  [id number] . Marland Kitchen This test was developed and its performance characteristics determined by this laboratory. It has not been cleared or approved by the U.S. Food and Drug Administration. The FDA has determined that such clearance or approval is not necessary. This test is used for clinical purposes. It should not be regarded as investigational or for research. This laboratory is certified under the  [place] (CLIA-88) as qualified to perform high complexity clinical laboratory testing.    Comprehensive metabolic panel     Status: Abnormal   Collection Time: 03/21/17  6:23 PM  Result Value Ref Range   Sodium 136 135 - 145 mmol/L   Potassium 3.4 (L) 3.5 - 5.1 mmol/L   Chloride 98 (L) 101 - 111 mmol/L   CO2 25 22 - 32 mmol/L   Glucose, Bld 106 (H) 65 - 99 mg/dL   BUN 15 6 - 20 mg/dL   Creatinine, Ser 0.97 0.61 - 1.24 mg/dL   Calcium 9.8 8.9 - 10.3 mg/dL   Total Protein 8.3 (H) 6.5 - 8.1 g/dL   Albumin 4.3 3.5 - 5.0 g/dL   AST 21 15 - 41 U/L   ALT 16 (L) 17 - 63 U/L   Alkaline Phosphatase 99 38 - 126 U/L   Total Bilirubin 0.7 0.3 - 1.2 mg/dL   GFR calc non Af Amer >60 >60 mL/min   GFR calc Af Amer >60 >60 mL/min    Comment: (NOTE) The eGFR has been calculated using the CKD EPI equation. This calculation has not been validated in all clinical situations. eGFR's persistently <60 mL/min signify possible Chronic Kidney Disease.    Anion gap 13 5 - 15  CBC WITH DIFFERENTIAL     Status: Abnormal   Collection Time: 03/21/17  6:23 PM  Result Value Ref Range   WBC 10.9 (H) 4.0 - 10.5 K/uL   RBC 4.55 4.22 - 5.81 MIL/uL   Hemoglobin 14.7 13.0 - 17.0 g/dL   HCT 42.7 39.0 - 52.0 %   MCV 93.8 78.0 - 100.0 fL   MCH 32.3 26.0 - 34.0 pg   MCHC 34.4 30.0 - 36.0 g/dL   RDW 13.2 11.5 - 15.5 %   Platelets 243 150 - 400 K/uL   Neutrophils Relative % 54 %   Neutro Abs 5.9 1.7 - 7.7 K/uL   Lymphocytes Relative 35 %   Lymphs Abs 3.8 0.7 - 4.0 K/uL   Monocytes Relative 8 %   Monocytes Absolute 0.9 0.1 - 1.0 K/uL   Eosinophils Relative 2 %   Eosinophils Absolute 0.2 0.0 - 0.7 K/uL   Basophils Relative 1 %   Basophils Absolute 0.1 0.0 - 0.1 K/uL  CMV IgM     Status: None   Collection Time: 03/21/17  6:23 PM  Result Value Ref Range   CMV IgM <30.0 0.0 - 29.9 AU/mL    Comment: (NOTE)                                Negative         <  30.0                                 Equivocal  30.0 - 34.9                                Positive         >34.9 A positive result is generally indicative of acute infection, reactivation or persistent IgM production. Performed At: [facility] [place], [place] [id number] Rush Farmer MD [id number]   Cmv antibody, IgG (EIA)     Status: Abnormal   Collection Time: 03/21/17  6:23 PM  Result Value Ref Range   CMV Ab - IgG >10.00 (H) 0.00 - 0.59 U/mL    Comment: (NOTE)                               Negative          <0.60                               Equivocal   0.60 - 0.69                               Positive          >0.69 Performed At: [facility] [place], [place] [id number] Rush Farmer MD [id number]   Cytology (oral, anal, urethral) ancillary only     Status: None   Collection Time: 04/04/17 12:00 AM  Result Value Ref Range   Chlamydia Negative     Comment: Normal Reference Range - Negative   Neisseria gonorrhea Negative     Comment: Normal Reference Range - Negative  Urine cytology ancillary only     Status: None   Collection Time: 04/04/17 12:00 AM  Result Value Ref Range   Chlamydia Negative     Comment: Normal Reference Range - Negative   Neisseria gonorrhea Negative     Comment: Normal Reference Range - Negative  POCT glycosylated hemoglobin (Hb A1C)     Status: Normal   Collection Time: 04/15/17  9:46 AM  Result Value Ref Range   Hemoglobin A1C 5.7

## 2016-12-31 ENCOUNTER — Ambulatory Visit (INDEPENDENT_AMBULATORY_CARE_PROVIDER_SITE_OTHER): Payer: Medicare Other | Admitting: Family Medicine

## 2016-12-31 ENCOUNTER — Encounter: Payer: Self-pay | Admitting: Family Medicine

## 2016-12-31 VITALS — BP 109/73 | HR 79 | Ht 69.5 in | Wt 200.0 lb

## 2016-12-31 DIAGNOSIS — E038 Other specified hypothyroidism: Secondary | ICD-10-CM

## 2016-12-31 DIAGNOSIS — E7849 Other hyperlipidemia: Secondary | ICD-10-CM

## 2016-12-31 DIAGNOSIS — E559 Vitamin D deficiency, unspecified: Secondary | ICD-10-CM

## 2016-12-31 DIAGNOSIS — E119 Type 2 diabetes mellitus without complications: Secondary | ICD-10-CM

## 2016-12-31 DIAGNOSIS — E1129 Type 2 diabetes mellitus with other diabetic kidney complication: Secondary | ICD-10-CM

## 2016-12-31 DIAGNOSIS — R809 Proteinuria, unspecified: Secondary | ICD-10-CM

## 2016-12-31 DIAGNOSIS — I10 Essential (primary) hypertension: Secondary | ICD-10-CM

## 2016-12-31 DIAGNOSIS — E781 Pure hyperglyceridemia: Secondary | ICD-10-CM

## 2016-12-31 DIAGNOSIS — E1121 Type 2 diabetes mellitus with diabetic nephropathy: Secondary | ICD-10-CM

## 2016-12-31 DIAGNOSIS — E1159 Type 2 diabetes mellitus with other circulatory complications: Secondary | ICD-10-CM

## 2016-12-31 DIAGNOSIS — E782 Mixed hyperlipidemia: Secondary | ICD-10-CM

## 2016-12-31 DIAGNOSIS — E1169 Type 2 diabetes mellitus with other specified complication: Secondary | ICD-10-CM

## 2016-12-31 DIAGNOSIS — I152 Hypertension secondary to endocrine disorders: Secondary | ICD-10-CM

## 2016-12-31 DIAGNOSIS — E786 Lipoprotein deficiency: Secondary | ICD-10-CM

## 2016-12-31 MED ORDER — PRAVASTATIN SODIUM 20 MG PO TABS: 20.0000 mg | ORAL_TABLET | Freq: Every day | ORAL | 0 refills | Status: DC

## 2016-12-31 MED ORDER — GEMFIBROZIL 600 MG PO TABS: ORAL_TABLET | ORAL | 1 refills | Status: DC

## 2016-12-31 NOTE — Patient Instructions (Signed)
Synthroid or levothyroxine is a medicine you should take totally separate from any other medicines even including vitamins.  It should be taking completely on its own and on an empty stomach and wait at least a couple hours before you take any other medicines.

## 2017-01-01 ENCOUNTER — Telehealth: Payer: Self-pay | Admitting: Family Medicine

## 2017-01-01 NOTE — Telephone Encounter (Signed)
Pt called d/t interactions between gembrizol and pravastatin causing rhabdomyolysis.  Please very that pt is to take both of these medications.  Thanks!  Tiajuana Amass. Nelson, CMA

## 2017-01-01 NOTE — Telephone Encounter (Signed)
[facility] from [facility] needs order clarification for a prescription sent yesterday. He can be reached at [phone]

## 2017-01-02 ENCOUNTER — Encounter: Payer: Self-pay | Admitting: Family Medicine

## 2017-01-02 NOTE — Telephone Encounter (Signed)
Yes. Aware of this. I discussed r/b of these meds with pt and he was told he needs close f/up with labs- ALT in 4-6wks.   Please call pt and tell him ok to take with close f/up and monitoring

## 2017-01-02 NOTE — Telephone Encounter (Signed)
Pt advised to take both medications and need for LFTs in 4-6 weeks.  Pt states that he sees Dr. Synthia InnocentVan Damm on 02/06/17 and they will be doing blood work at that time.  Pt inquired if he is to follow up with Dr. Sharee Holsterpalski a week after labs are drawn.  Please advise.  Tiajuana Amass. Nelson, CMA

## 2017-01-02 NOTE — Telephone Encounter (Signed)
Yes, that's what we decided at [facility].

## 2017-01-02 NOTE — Telephone Encounter (Signed)
Spoke with Joe at [facility] and advised that Dr. Sharee Holsterpalski is aware of possible interactions with these medications and that we will be following pt closely with labs.  Jonathan Estes. Jomar Denz, CMA

## 2017-01-03 NOTE — Telephone Encounter (Signed)
Called spoke to patient, appointment to follow up was made for 02/14/2017.  MPulliam, CMA/RT(R)

## 2017-01-07 ENCOUNTER — Other Ambulatory Visit: Payer: Self-pay | Admitting: Infectious Disease

## 2017-01-07 DIAGNOSIS — F419 Anxiety disorder, unspecified: Secondary | ICD-10-CM

## 2017-01-14 ENCOUNTER — Other Ambulatory Visit: Payer: Self-pay | Admitting: Infectious Disease

## 2017-01-14 DIAGNOSIS — B20 Human immunodeficiency virus [HIV] disease: Secondary | ICD-10-CM

## 2017-02-06 ENCOUNTER — Encounter: Payer: Self-pay | Admitting: Infectious Disease

## 2017-02-06 ENCOUNTER — Ambulatory Visit (INDEPENDENT_AMBULATORY_CARE_PROVIDER_SITE_OTHER): Payer: Medicare Other | Admitting: Infectious Disease

## 2017-02-06 ENCOUNTER — Other Ambulatory Visit: Payer: Self-pay | Admitting: Family Medicine

## 2017-02-06 ENCOUNTER — Encounter (INDEPENDENT_AMBULATORY_CARE_PROVIDER_SITE_OTHER): Payer: Medicare Other | Admitting: *Deleted

## 2017-02-06 VITALS — BP 148/79 | HR 71 | Temp 97.5°F | Wt 191.0 lb

## 2017-02-06 VITALS — BP 148/79 | HR 71 | Temp 97.5°F | Wt 191.5 lb

## 2017-02-06 DIAGNOSIS — B20 Human immunodeficiency virus [HIV] disease: Secondary | ICD-10-CM

## 2017-02-06 DIAGNOSIS — Z006 Encounter for examination for normal comparison and control in clinical research program: Secondary | ICD-10-CM

## 2017-02-06 DIAGNOSIS — E1121 Type 2 diabetes mellitus with diabetic nephropathy: Secondary | ICD-10-CM | POA: Diagnosis not present

## 2017-02-06 DIAGNOSIS — E119 Type 2 diabetes mellitus without complications: Secondary | ICD-10-CM

## 2017-02-06 DIAGNOSIS — I2510 Atherosclerotic heart disease of native coronary artery without angina pectoris: Secondary | ICD-10-CM

## 2017-02-06 DIAGNOSIS — I10 Essential (primary) hypertension: Secondary | ICD-10-CM

## 2017-02-06 DIAGNOSIS — E038 Other specified hypothyroidism: Secondary | ICD-10-CM

## 2017-02-06 LAB — COMPREHENSIVE METABOLIC PANEL
AG RATIO: 1.3 (calc) (ref 1.0–2.5)
ALT: 11 U/L (ref 9–46)
AST: 16 U/L (ref 10–35)
Albumin: 4.3 g/dL (ref 3.6–5.1)
Alkaline phosphatase (APISO): 96 U/L (ref 40–115)
BILIRUBIN TOTAL: 0.4 mg/dL (ref 0.2–1.2)
BUN: 16 mg/dL (ref 7–25)
CALCIUM: 9.7 mg/dL (ref 8.6–10.3)
CHLORIDE: 99 mmol/L (ref 98–110)
CO2: 28 mmol/L (ref 20–32)
Creat: 1.03 mg/dL (ref 0.70–1.33)
GLOBULIN: 3.4 g/dL (ref 1.9–3.7)
GLUCOSE: 93 mg/dL (ref 65–99)
Potassium: 4.2 mmol/L (ref 3.5–5.3)
SODIUM: 136 mmol/L (ref 135–146)
TOTAL PROTEIN: 7.7 g/dL (ref 6.1–8.1)

## 2017-02-06 MED ORDER — BICTEGRAVIR-EMTRICITAB-TENOFOV 50-200-25 MG PO TABS: 1.0000 | ORAL_TABLET | Freq: Every day | ORAL | 11 refills | Status: DC

## 2017-02-06 NOTE — Progress Notes (Signed)
Jonathan Estes is here today for a visit for A5321 and [id number]. He denies any new problems. He said that Dr. Daiva EvesVan Dam was [facility] and he will let me know when he starts it. He will be returning in May for the next study visit.

## 2017-02-06 NOTE — Progress Notes (Signed)
"chief complaint: follow-up for HIV, DM, and hypothyroidism Subjective:    Patient ID: Jonathan Estes, male    DOB: 1955/09/01, 61 y.o.   MRN: [id number]  HPI  Jonathan Estes is a 61 y.o. male who is doing superbly well on their antiviral regimen of isentress and truvada, with undetectable viral load and health cd4 count.    He has comorbid DM controlled and hypothyroidism on synthroid.  He is being followed closely by Dr. Sharee Holster for primary care.  He is seeing Selena Batten today for research and is having blood drawn with that visit.  Lab Results  Component Value Date   HIV1RNAQUANT <40 12/01/2015   HIV1RNAQUANT <40 12/01/2015   HIV1RNAQUANT <20 03/18/2012   Lab Results  Component Value Date   CD4TABS 641 01/12/2015   CD4TABS 670 05/25/2014   CD4TABS 713 02/18/2014   Again discussed various ways to simplify his antiretroviral regimen and we ultimately decided to change to [facility].  Jonathan Estes tells me he has about 9 months worth of ISENTRESS and Descovy at home.   Past Medical History:  Diagnosis Date  . AIDS (HCC) 07/12/2014  . Anxiety   . Bilateral bunions    left foot worse  . Coronary artery disease involving native coronary artery 01/12/2015  . Diabetes mellitus   . Diabetes mellitus type 2, controlled, without complications (HCC) 01/12/2015  . Diabetes type 2, controlled (HCC) 07/12/2014  . HTN (hypertension)   . Human immunodeficiency virus (HIV) disease (HCC)   . Hyperlipidemia   . Hypothyroid   . Pneumocystis carinii pneumonia [facility])     Past Surgical History:  Procedure Laterality Date  . LUNG BIOPSY    . None      Family History  Problem Relation Age of Onset  . Cancer Father        Colon  . Colon cancer Father   . Leukemia Maternal Grandfather       Social History   Socioeconomic History  . Marital status: Single    Spouse name: None  . Number of children: None  . Years of education: None  . Highest education level: None  Social Needs  . Financial  resource strain: None  . Food insecurity - worry: None  . Food insecurity - inability: None  . Transportation needs - medical: None  . Transportation needs - non-medical: None  Occupational History  . None  Tobacco Use  . Smoking status: Never Smoker  . Smokeless tobacco: Never Used  Substance and Sexual Activity  . Alcohol use: Yes    Alcohol/week: 0.5 oz    Types: 1 Standard drinks or equivalent per week  . Drug use: Yes    Frequency: 5.0 times per week    Types: Marijuana    Comment: Smokes marijuana daily!  . Sexual activity: Not Currently    Partners: Female  Other Topics Concern  . None  Social History Narrative   Lives alone.          Allergies  Allergen Reactions  . Penicillins   . Sulfamethoxazole-Trimethoprim     REACTION: severe rash     Current Outpatient Medications:  .  ALPRAZolam (XANAX) 0.5 MG tablet, TAKE 1 TABLET BY MOUTH 3 TIMES A DAY AS NEEDED FOR ANXIETY, Disp: 90 tablet, Rfl: 0 .  bictegravir-emtricitabine-tenofovir AF (BIKTARVY) 50-200-25 MG TABS tablet, Take 1 tablet by mouth daily., Disp: 30 tablet, Rfl: 11 .  cetirizine (ZYRTEC) 10 MG tablet, TAKE 1 TABLET (10 MG TOTAL) BY MOUTH DAILY.,  Disp: 90 tablet, Rfl: 2 .  Cholecalciferol (VITAMIN D3) 5000 units CAPS, Take 1 capsule by mouth., Disp: , Rfl:  .  [facility] LANCETS ORIGINAL MISC, 1 application by Does not apply route daily., Disp: 90 each, Rfl: 4 .  gemfibrozil (LOPID) 600 MG tablet, 1/2 tab twice daily, Disp: 90 tablet, Rfl: 1 .  glucose blood (ACCU-CHEK AVIVA) test strip, Use as instructed, Disp: 100 each, Rfl: 12 .  hydrochlorothiazide (MICROZIDE) 12.5 MG capsule, Take 1 capsule (12.5 mg total) by mouth daily., Disp: 90 capsule, Rfl: 0 .  levothyroxine (SYNTHROID, LEVOTHROID) 25 MCG tablet, TAKE 1 TABLET BY MOUTH EVERY DAY ON AN EMPTY STOMACH, Disp: 90 tablet, Rfl: 0 .  lisinopril (PRINIVIL,ZESTRIL) 40 MG tablet, TAKE 1 TABLET BY MOUTH EVERY DAY, Disp: 90 tablet, Rfl: 3 .  metFORMIN  (GLUCOPHAGE) 500 MG tablet, TAKE 1 TABLET (500 MG TOTAL) BY MOUTH 2 (TWO) TIMES DAILY WITH A MEAL., Disp: 30 tablet, Rfl: 0 .  pravastatin (PRAVACHOL) 20 MG tablet, Take 1 tablet (20 mg total) by mouth at bedtime., Disp: 90 tablet, Rfl: 0    Review of Systems  Constitutional: Negative for activity change, appetite change, chills, diaphoresis, fatigue, fever and unexpected weight change.  HENT: Negative for congestion, rhinorrhea, sinus pressure, sneezing, sore throat and trouble swallowing.   Eyes: Negative for photophobia and visual disturbance.  Respiratory: Negative for cough, chest tightness, shortness of breath, wheezing and stridor.   Cardiovascular: Negative for chest pain, palpitations and leg swelling.  Gastrointestinal: Negative for abdominal distention, abdominal pain, anal bleeding, blood in stool, constipation, diarrhea, nausea and vomiting.  Genitourinary: Negative for difficulty urinating, dysuria, flank pain and hematuria.  Musculoskeletal: Negative for arthralgias, back pain, gait problem, joint swelling and myalgias.  Skin: Negative for color change, pallor, rash and wound.  Neurological: Negative for dizziness, tremors, weakness and light-headedness.  Hematological: Negative for adenopathy. Does not bruise/bleed easily.  Psychiatric/Behavioral: Negative for agitation, behavioral problems, confusion, decreased concentration, dysphoric mood and sleep disturbance.       Objective:   Physical Exam  Constitutional: He is oriented to person, place, and time. He appears well-developed and well-nourished. No distress.  HENT:  Head: Normocephalic and atraumatic.  Mouth/Throat: Oropharynx is clear and moist. No oropharyngeal exudate.  Eyes: Conjunctivae and EOM are normal. Pupils are equal, round, and reactive to light. No scleral icterus.  Neck: Normal range of motion. Neck supple. No JVD present.  Cardiovascular: Normal rate, regular rhythm and normal heart sounds.   Pulmonary/Chest: Effort normal. No respiratory distress. He has no wheezes.  Abdominal: He exhibits no distension.  Musculoskeletal: He exhibits no edema or tenderness.  Lymphadenopathy:    He has no cervical adenopathy.  Neurological: He is alert and oriented to person, place, and time. He has normal reflexes. He exhibits normal muscle tone. Coordination normal.  Skin: Skin is warm and dry. He is not diaphoretic. No erythema. No pallor.  Psychiatric: He has a normal mood and affect. His behavior is normal. Judgment and thought content normal.  Nursing note and vitals reviewed.         Assessment & Plan:  HIV: Change to BIKTARVY, and check labs 2 months after switched to BIKTARVY    Hypothyroidism: continue synthroid which is being prescribed by primary care physician  DM: very good control, last A1c was 6 followed by primary care physician  HTN: BP up a bit today. He leaves it is due to being stressed coming to the doctor's office. Vitals:   02/06/17  1029  BP: (!) 148/79  Pulse: 71  Temp: (!) 97.5 F (36.4 C)     CAD: seen on CT angio. No need for new interventions, no ssx, on statin, Asa  Hyperlipidemia: on statin  Anxiety , generalized: continue prn xanax  I spent greater than 25 minutes with the patient including greater than 50% of time in face to face counsel of the patient and in coordination of his care and all of his laboratory data with him and specifically going over his new single tablet regimen BIKTARVY and how to take this medication and our plans for repeat lab work to ensure he is still maintaining perfect virological suppression. .Marland Kitchen

## 2017-02-07 ENCOUNTER — Other Ambulatory Visit: Payer: Self-pay

## 2017-02-07 DIAGNOSIS — B20 Human immunodeficiency virus [HIV] disease: Secondary | ICD-10-CM

## 2017-02-07 DIAGNOSIS — E119 Type 2 diabetes mellitus without complications: Secondary | ICD-10-CM

## 2017-02-07 LAB — CD4/CD8 (T-HELPER/T-SUPPRESSOR CELL)
CD4 COUNT: 813
CD4 T CELL HELPER: 25.4
CD8 T CELL ABS: 1498
CD8 T CELL SUPPRESSOR: 46.8

## 2017-02-07 LAB — HEPATITIS C ANTIBODY
Hepatitis C Ab: NONREACTIVE
SIGNAL TO CUT-OFF: 0.09 (ref <1.00)

## 2017-02-07 MED ORDER — METFORMIN HCL 500 MG PO TABS: 500.0000 mg | ORAL_TABLET | Freq: Two times a day (BID) | ORAL | 0 refills | Status: DC

## 2017-02-12 ENCOUNTER — Encounter: Payer: Self-pay | Admitting: Family Medicine

## 2017-02-13 ENCOUNTER — Other Ambulatory Visit: Payer: Self-pay | Admitting: Family Medicine

## 2017-02-13 DIAGNOSIS — I10 Essential (primary) hypertension: Secondary | ICD-10-CM

## 2017-02-13 DIAGNOSIS — E038 Other specified hypothyroidism: Secondary | ICD-10-CM

## 2017-02-14 ENCOUNTER — Ambulatory Visit: Payer: Self-pay | Admitting: Family Medicine

## 2017-02-18 ENCOUNTER — Telehealth: Payer: Self-pay | Admitting: *Deleted

## 2017-02-18 ENCOUNTER — Other Ambulatory Visit: Payer: Self-pay | Admitting: Infectious Disease

## 2017-02-18 DIAGNOSIS — F419 Anxiety disorder, unspecified: Secondary | ICD-10-CM

## 2017-02-18 NOTE — Telephone Encounter (Signed)
Patient called stating that he has not [facility]; as [facility]. He said he did speak to the pharmacy and he is expecting his medication to arrive tomorrow. He wanted to know if being on it for a month was sufficient time, as far as having his labs rechecked. Advised it is and he will come for his labs in February.

## 2017-02-20 ENCOUNTER — Telehealth: Payer: Self-pay | Admitting: *Deleted

## 2017-02-20 NOTE — Telephone Encounter (Signed)
Patient called and left a voice mail stating that [facility] told [facility] to call his [facility] regarding a refill on the Xanax. Please advise if ok to refill. I do not see in last office note on 02/06/17 that this was discussed. Thank you Wendall MolaJacqueline Nemiah Bubar CMA

## 2017-02-21 ENCOUNTER — Other Ambulatory Visit: Payer: Self-pay | Admitting: *Deleted

## 2017-02-21 DIAGNOSIS — F419 Anxiety disorder, unspecified: Secondary | ICD-10-CM

## 2017-02-21 MED ORDER — ALPRAZOLAM 0.5 MG PO TABS: ORAL_TABLET | ORAL | 0 refills | Status: DC

## 2017-02-21 NOTE — Telephone Encounter (Signed)
He has been getting it for many years but not every month. I am ok to have it filled he was grand fathered in and I dont want him having a seizure due to abruptly stopping it IF he has been needing it for anxiety

## 2017-02-21 NOTE — Telephone Encounter (Signed)
Thanks Jackie 

## 2017-02-21 NOTE — Telephone Encounter (Signed)
Rx called to patient's pharmacy.

## 2017-02-25 ENCOUNTER — Other Ambulatory Visit: Payer: Self-pay | Admitting: Family Medicine

## 2017-02-25 DIAGNOSIS — I10 Essential (primary) hypertension: Secondary | ICD-10-CM

## 2017-02-25 DIAGNOSIS — E038 Other specified hypothyroidism: Secondary | ICD-10-CM

## 2017-03-06 ENCOUNTER — Telehealth: Payer: Self-pay | Admitting: Family Medicine

## 2017-03-19 DIAGNOSIS — H353210 Exudative age-related macular degeneration, right eye, stage unspecified: Secondary | ICD-10-CM | POA: Diagnosis not present

## 2017-03-19 DIAGNOSIS — H35721 Serous detachment of retinal pigment epithelium, right eye: Secondary | ICD-10-CM | POA: Diagnosis not present

## 2017-03-19 DIAGNOSIS — H3530 Unspecified macular degeneration: Secondary | ICD-10-CM | POA: Diagnosis not present

## 2017-03-19 DIAGNOSIS — H04123 Dry eye syndrome of bilateral lacrimal glands: Secondary | ICD-10-CM | POA: Diagnosis not present

## 2017-03-19 DIAGNOSIS — H2513 Age-related nuclear cataract, bilateral: Secondary | ICD-10-CM | POA: Diagnosis not present
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
## 2017-03-20 ENCOUNTER — Encounter: Payer: Self-pay | Admitting: *Deleted

## 2017-03-20 ENCOUNTER — Encounter (INDEPENDENT_AMBULATORY_CARE_PROVIDER_SITE_OTHER): Payer: Self-pay | Admitting: Ophthalmology

## 2017-03-20 ENCOUNTER — Ambulatory Visit (INDEPENDENT_AMBULATORY_CARE_PROVIDER_SITE_OTHER): Payer: Medicare Other | Admitting: Ophthalmology

## 2017-03-20 DIAGNOSIS — H3581 Retinal edema: Secondary | ICD-10-CM | POA: Diagnosis not present

## 2017-03-20 DIAGNOSIS — B20 Human immunodeficiency virus [HIV] disease: Secondary | ICD-10-CM | POA: Diagnosis not present

## 2017-03-20 DIAGNOSIS — H469 Unspecified optic neuritis: Secondary | ICD-10-CM | POA: Diagnosis not present

## 2017-03-20 DIAGNOSIS — H3091 Unspecified chorioretinal inflammation, right eye: Secondary | ICD-10-CM

## 2017-03-20 DIAGNOSIS — E113393 Type 2 diabetes mellitus with moderate nonproliferative diabetic retinopathy without macular edema, bilateral: Secondary | ICD-10-CM | POA: Diagnosis not present

## 2017-03-20 DIAGNOSIS — H25813 Combined forms of age-related cataract, bilateral: Secondary | ICD-10-CM

## 2017-03-20 LAB — HIV-1 RNA QUANT-NO REFLEX-BLD: HIV-1 RNA Viral Load: <40

## 2017-03-21 ENCOUNTER — Telehealth: Payer: Self-pay

## 2017-03-21 ENCOUNTER — Inpatient Hospital Stay (HOSPITAL_COMMUNITY)
Admission: AD | Admit: 2017-03-21 | Discharge: 2017-03-23 | DRG: 057 | Disposition: A | Discharge status: Home Health | Payer: Medicare Other | Source: Ambulatory Visit | Attending: Internal Medicine | Admitting: Internal Medicine

## 2017-03-21 ENCOUNTER — Encounter: Payer: Self-pay | Admitting: Infectious Diseases

## 2017-03-21 ENCOUNTER — Encounter (INDEPENDENT_AMBULATORY_CARE_PROVIDER_SITE_OTHER): Payer: Self-pay | Admitting: Ophthalmology

## 2017-03-21 ENCOUNTER — Ambulatory Visit (INDEPENDENT_AMBULATORY_CARE_PROVIDER_SITE_OTHER): Payer: Medicare Other | Admitting: Ophthalmology

## 2017-03-21 ENCOUNTER — Ambulatory Visit (INDEPENDENT_AMBULATORY_CARE_PROVIDER_SITE_OTHER): Payer: Medicare Other | Admitting: Infectious Diseases

## 2017-03-21 ENCOUNTER — Other Ambulatory Visit: Payer: Self-pay

## 2017-03-21 ENCOUNTER — Encounter (HOSPITAL_COMMUNITY): Payer: Self-pay | Admitting: General Practice

## 2017-03-21 VITALS — BP 152/88 | HR 77 | Temp 98.0°F | Wt 190.0 lb

## 2017-03-21 DIAGNOSIS — H25813 Combined forms of age-related cataract, bilateral: Secondary | ICD-10-CM

## 2017-03-21 DIAGNOSIS — J302 Other seasonal allergic rhinitis: Secondary | ICD-10-CM | POA: Diagnosis not present

## 2017-03-21 DIAGNOSIS — I2510 Atherosclerotic heart disease of native coronary artery without angina pectoris: Secondary | ICD-10-CM | POA: Diagnosis not present

## 2017-03-21 DIAGNOSIS — Z882 Allergy status to sulfonamides status: Secondary | ICD-10-CM | POA: Diagnosis not present

## 2017-03-21 DIAGNOSIS — H209 Unspecified iridocyclitis: Secondary | ICD-10-CM | POA: Insufficient documentation

## 2017-03-21 DIAGNOSIS — H3091 Unspecified chorioretinal inflammation, right eye: Secondary | ICD-10-CM | POA: Diagnosis present

## 2017-03-21 DIAGNOSIS — F419 Anxiety disorder, unspecified: Secondary | ICD-10-CM | POA: Diagnosis present

## 2017-03-21 DIAGNOSIS — H3090 Unspecified chorioretinal inflammation, unspecified eye: Secondary | ICD-10-CM

## 2017-03-21 DIAGNOSIS — E785 Hyperlipidemia, unspecified: Secondary | ICD-10-CM | POA: Diagnosis present

## 2017-03-21 DIAGNOSIS — E1121 Type 2 diabetes mellitus with diabetic nephropathy: Secondary | ICD-10-CM | POA: Diagnosis not present

## 2017-03-21 DIAGNOSIS — I10 Essential (primary) hypertension: Secondary | ICD-10-CM | POA: Diagnosis not present

## 2017-03-21 DIAGNOSIS — E559 Vitamin D deficiency, unspecified: Secondary | ICD-10-CM | POA: Diagnosis present

## 2017-03-21 DIAGNOSIS — H3581 Retinal edema: Secondary | ICD-10-CM | POA: Diagnosis not present

## 2017-03-21 DIAGNOSIS — B20 Human immunodeficiency virus [HIV] disease: Secondary | ICD-10-CM

## 2017-03-21 DIAGNOSIS — Z79899 Other long term (current) drug therapy: Secondary | ICD-10-CM

## 2017-03-21 DIAGNOSIS — Z7984 Long term (current) use of oral hypoglycemic drugs: Secondary | ICD-10-CM

## 2017-03-21 DIAGNOSIS — E1169 Type 2 diabetes mellitus with other specified complication: Secondary | ICD-10-CM | POA: Diagnosis present

## 2017-03-21 DIAGNOSIS — E113393 Type 2 diabetes mellitus with moderate nonproliferative diabetic retinopathy without macular edema, bilateral: Secondary | ICD-10-CM | POA: Diagnosis not present

## 2017-03-21 DIAGNOSIS — Z95828 Presence of other vascular implants and grafts: Secondary | ICD-10-CM | POA: Diagnosis not present

## 2017-03-21 DIAGNOSIS — Z21 Asymptomatic human immunodeficiency virus [HIV] infection status: Secondary | ICD-10-CM | POA: Diagnosis not present

## 2017-03-21 DIAGNOSIS — H539 Unspecified visual disturbance: Secondary | ICD-10-CM | POA: Diagnosis not present

## 2017-03-21 DIAGNOSIS — Z79890 Hormone replacement therapy (postmenopausal): Secondary | ICD-10-CM | POA: Diagnosis not present

## 2017-03-21 DIAGNOSIS — A530 Latent syphilis, unspecified as early or late: Secondary | ICD-10-CM | POA: Diagnosis present

## 2017-03-21 DIAGNOSIS — E782 Mixed hyperlipidemia: Secondary | ICD-10-CM

## 2017-03-21 DIAGNOSIS — H469 Unspecified optic neuritis: Secondary | ICD-10-CM

## 2017-03-21 DIAGNOSIS — E119 Type 2 diabetes mellitus without complications: Secondary | ICD-10-CM

## 2017-03-21 DIAGNOSIS — F329 Major depressive disorder, single episode, unspecified: Secondary | ICD-10-CM | POA: Diagnosis present

## 2017-03-21 DIAGNOSIS — E1139 Type 2 diabetes mellitus with other diabetic ophthalmic complication: Secondary | ICD-10-CM

## 2017-03-21 DIAGNOSIS — J3089 Other allergic rhinitis: Secondary | ICD-10-CM | POA: Diagnosis present

## 2017-03-21 DIAGNOSIS — A523 Neurosyphilis, unspecified: Secondary | ICD-10-CM | POA: Diagnosis present

## 2017-03-21 DIAGNOSIS — E781 Pure hyperglyceridemia: Secondary | ICD-10-CM | POA: Diagnosis not present

## 2017-03-21 DIAGNOSIS — H538 Other visual disturbances: Secondary | ICD-10-CM

## 2017-03-21 DIAGNOSIS — H5461 Unqualified visual loss, right eye, normal vision left eye: Secondary | ICD-10-CM | POA: Diagnosis present

## 2017-03-21 DIAGNOSIS — F341 Dysthymic disorder: Secondary | ICD-10-CM | POA: Diagnosis present

## 2017-03-21 DIAGNOSIS — A5143 Secondary syphilitic oculopathy: Secondary | ICD-10-CM | POA: Diagnosis not present

## 2017-03-21 DIAGNOSIS — E039 Hypothyroidism, unspecified: Secondary | ICD-10-CM | POA: Diagnosis present

## 2017-03-21 HISTORY — DX: Pure hyperglyceridemia: E78.1

## 2017-03-21 HISTORY — DX: Unspecified osteoarthritis, unspecified site: M19.90

## 2017-03-21 LAB — COMPREHENSIVE METABOLIC PANEL
ALBUMIN: 4.3 g/dL (ref 3.5–5.0)
ALT: 16 U/L — ABNORMAL LOW (ref 17–63)
AST: 21 U/L (ref 15–41)
Alkaline Phosphatase: 99 U/L (ref 38–126)
Anion gap: 13 (ref 5–15)
BILIRUBIN TOTAL: 0.7 mg/dL (ref 0.3–1.2)
BUN: 15 mg/dL (ref 6–20)
CHLORIDE: 98 mmol/L — AB (ref 101–111)
CO2: 25 mmol/L (ref 22–32)
Calcium: 9.8 mg/dL (ref 8.9–10.3)
Creatinine, Ser: 0.97 mg/dL (ref 0.61–1.24)
GFR calc Af Amer: >60 mL/min (ref >60)
GFR calc non Af Amer: >60 mL/min (ref >60)
GLUCOSE: 106 mg/dL — AB (ref 65–99)
Potassium: 3.4 mmol/L — ABNORMAL LOW (ref 3.5–5.1)
SODIUM: 136 mmol/L (ref 135–145)
TOTAL PROTEIN: 8.3 g/dL — AB (ref 6.5–8.1)

## 2017-03-21 LAB — CBC WITH DIFFERENTIAL/PLATELET
Basophils Absolute: 0.1 10*3/uL (ref 0.0–0.1)
Basophils Relative: 1 %
EOS PCT: 2 %
Eosinophils Absolute: 0.2 10*3/uL (ref 0.0–0.7)
HEMATOCRIT: 42.7 % (ref 39.0–52.0)
Hemoglobin: 14.7 g/dL (ref 13.0–17.0)
LYMPHS ABS: 3.8 10*3/uL (ref 0.7–4.0)
LYMPHS PCT: 35 %
MCH: 32.3 pg (ref 26.0–34.0)
MCHC: 34.4 g/dL (ref 30.0–36.0)
MCV: 93.8 fL (ref 78.0–100.0)
MONO ABS: 0.9 10*3/uL (ref 0.1–1.0)
MONOS PCT: 8 %
Neutro Abs: 5.9 10*3/uL (ref 1.7–7.7)
Neutrophils Relative %: 54 %
Platelets: 243 10*3/uL (ref 150–400)
RBC: 4.55 MIL/uL (ref 4.22–5.81)
RDW: 13.2 % (ref 11.5–15.5)
WBC: 10.9 10*3/uL — ABNORMAL HIGH (ref 4.0–10.5)

## 2017-03-21 MED ORDER — VITAMIN D 1000 UNITS PO TABS
1000.0000 [IU] | ORAL_TABLET | Freq: Once | ORAL | Status: DC
  Filled 2017-03-21: qty 1

## 2017-03-21 MED ORDER — SODIUM CHLORIDE 0.9 % IV SOLN: 250.0000 mL | INTRAVENOUS | Status: DC | PRN

## 2017-03-21 MED ORDER — ICAPS AREDS 2 PO CAPS: 1.0000 | ORAL_CAPSULE | Freq: Two times a day (BID) | ORAL | Status: DC

## 2017-03-21 MED ORDER — LISINOPRIL 40 MG PO TABS
40.0000 mg | ORAL_TABLET | Freq: Every day | ORAL | Status: DC
  Administered 2017-03-22 – 2017-03-23 (×2): 40 mg via ORAL
  Filled 2017-03-21 (×2): qty 1

## 2017-03-21 MED ORDER — ACETAMINOPHEN 650 MG RE SUPP: 650.0000 mg | Freq: Four times a day (QID) | RECTAL | Status: DC | PRN

## 2017-03-21 MED ORDER — SODIUM CHLORIDE 0.9% FLUSH
3.0000 mL | Freq: Two times a day (BID) | INTRAVENOUS | Status: DC
  Administered 2017-03-21 – 2017-03-23 (×5): 3 mL via INTRAVENOUS

## 2017-03-21 MED ORDER — METFORMIN HCL 500 MG PO TABS
500.0000 mg | ORAL_TABLET | Freq: Two times a day (BID) | ORAL | Status: DC
  Administered 2017-03-21 – 2017-03-23 (×4): 500 mg via ORAL
  Filled 2017-03-21 (×4): qty 1

## 2017-03-21 MED ORDER — SODIUM CHLORIDE 0.9% FLUSH: 3.0000 mL | INTRAVENOUS | Status: DC | PRN

## 2017-03-21 MED ORDER — ACETAMINOPHEN 325 MG PO TABS: 650.0000 mg | ORAL_TABLET | Freq: Four times a day (QID) | ORAL | Status: DC | PRN

## 2017-03-21 MED ORDER — OCUVITE-LUTEIN PO CAPS: 1.0000 | ORAL_CAPSULE | Freq: Every day | ORAL | Status: DC

## 2017-03-21 MED ORDER — PRAVASTATIN SODIUM 20 MG PO TABS
20.0000 mg | ORAL_TABLET | Freq: Every day | ORAL | Status: DC
  Administered 2017-03-21 – 2017-03-22 (×2): 20 mg via ORAL
  Filled 2017-03-21 (×2): qty 1

## 2017-03-21 MED ORDER — NAPHAZOLINE-GLYCERIN 0.012-0.2 % OP SOLN
1.0000 [drp] | Freq: Four times a day (QID) | OPHTHALMIC | Status: DC | PRN
  Filled 2017-03-21: qty 15

## 2017-03-21 MED ORDER — ONDANSETRON HCL 4 MG PO TABS: 4.0000 mg | ORAL_TABLET | Freq: Four times a day (QID) | ORAL | Status: DC | PRN

## 2017-03-21 MED ORDER — PROSIGHT PO TABS
1.0000 | ORAL_TABLET | Freq: Every day | ORAL | Status: DC
  Administered 2017-03-21 – 2017-03-23 (×3): 1 via ORAL
  Filled 2017-03-21 (×3): qty 1

## 2017-03-21 MED ORDER — LORATADINE 10 MG PO TABS
10.0000 mg | ORAL_TABLET | Freq: Every day | ORAL | Status: DC
  Administered 2017-03-21 – 2017-03-23 (×3): 10 mg via ORAL
  Filled 2017-03-21 (×3): qty 1

## 2017-03-21 MED ORDER — ALPRAZOLAM 0.5 MG PO TABS
0.5000 mg | ORAL_TABLET | Freq: Three times a day (TID) | ORAL | Status: DC | PRN
  Administered 2017-03-21 – 2017-03-23 (×2): 0.5 mg via ORAL
  Filled 2017-03-21 (×2): qty 1

## 2017-03-21 MED ORDER — HYDROCHLOROTHIAZIDE 12.5 MG PO CAPS
12.5000 mg | ORAL_CAPSULE | Freq: Every day | ORAL | Status: DC
  Administered 2017-03-22 – 2017-03-23 (×2): 12.5 mg via ORAL
  Filled 2017-03-21 (×2): qty 1

## 2017-03-21 MED ORDER — LEVOTHYROXINE SODIUM 25 MCG PO TABS
25.0000 ug | ORAL_TABLET | Freq: Every day | ORAL | Status: DC
  Administered 2017-03-22 – 2017-03-23 (×2): 25 ug via ORAL
  Filled 2017-03-21 (×2): qty 1

## 2017-03-21 MED ORDER — DOCUSATE SODIUM 100 MG PO CAPS
100.0000 mg | ORAL_CAPSULE | Freq: Two times a day (BID) | ORAL | Status: DC
  Administered 2017-03-21 – 2017-03-23 (×4): 100 mg via ORAL
  Filled 2017-03-21 (×5): qty 1

## 2017-03-21 MED ORDER — BICTEGRAVIR-EMTRICITAB-TENOFOV 50-200-25 MG PO TABS
1.0000 | ORAL_TABLET | Freq: Every day | ORAL | Status: DC
Start: 2017-03-22 — End: 2017-03-23
  Administered 2017-03-22: 1 via ORAL
  Filled 2017-03-21 (×2): qty 1

## 2017-03-21 MED ORDER — ONDANSETRON HCL 4 MG/2ML IJ SOLN: 4.0000 mg | Freq: Four times a day (QID) | INTRAMUSCULAR | Status: DC | PRN

## 2017-03-21 MED ORDER — ZOLPIDEM TARTRATE 5 MG PO TABS
5.0000 mg | ORAL_TABLET | Freq: Every evening | ORAL | Status: DC | PRN
  Administered 2017-03-21 – 2017-03-23 (×2): 5 mg via ORAL
  Filled 2017-03-21 (×2): qty 1

## 2017-03-21 MED ORDER — GEMFIBROZIL 600 MG PO TABS
300.0000 mg | ORAL_TABLET | Freq: Two times a day (BID) | ORAL | Status: DC
  Administered 2017-03-21 – 2017-03-23 (×4): 300 mg via ORAL
  Filled 2017-03-21 (×5): qty 0.5

## 2017-03-21 MED ORDER — PENICILLIN G POTASSIUM 5000000 UNITS IJ SOLR
4.0000 10*6.[IU] | INTRAVENOUS | Status: DC
  Administered 2017-03-21 – 2017-03-23 (×10): 4 10*6.[IU] via INTRAVENOUS
  Filled 2017-03-21 (×19): qty 4

## 2017-03-21 NOTE — Telephone Encounter (Signed)
Per verbal order by Rexene AlbertsStephanie Dixon NP I called Hospital Admission to get Jonathan HeronMichael Men admitted for Neurosyphilis, bed type Telemetry Observation, Admitting MD Jonah BlueJennifer Yates. I spoke with Shawn from Bed Control who was able to take my call stated they would call the pt once a bed was available. The pt is aware to look out for their call. Lorenso CourierJose L Maldonado, [place]

## 2017-03-21 NOTE — Assessment & Plan Note (Signed)
HIV has been well controlled on Biktarvy in reviewing previous labs. Will check VL and CD4 as it appears he is due.   HIV 1 RNA Quant  Date Value  12/01/2015 <40  12/01/2015 <40  03/18/2012 <20 copies/mL   HIV-1 RNA Viral Load (no units)  Date Value  02/06/2017 <40  10/04/2016 <40  05/30/2016 <40   CD4 (no units)  Date Value  01/12/2015 641  05/25/2014 670  02/18/2014 713

## 2017-03-21 NOTE — Progress Notes (Addendum)
Jonathan ArenasMicheal B Estes [id number] Admission Data: 03/21/2017 5:56 PM Attending Provider: Jonah BlueYates, Jennifer, MD  WUJ:[id number]:Opalski, Gavin Poundeborah, DO Consults/ Treatment Team:   Jonathan ArenasMicheal B Dehn is a 62 y.o. male patient admitted from ED awake, alert  & orientated  X 3,  Full Code, VSS - Blood pressure (!) 161/73, pulse 79, temperature 97.8 F (36.6 C), temperature source Oral, resp. rate 18, SpO2 97 %., no c/o shortness of breath, no c/o chest pain, no distress noted. Tele # 17 placed and pt is currently running:normal sinus rhythm.   IV site WDL:  forearm right, condition patent and no redness with a transparent dsg that's clean dry and intact.  Allergies:   Allergies  Allergen Reactions  . Penicillins     Patient couldn't urinate and had body aches - Not a true allergy  Has patient had a PCN reaction causing immediate rash, facial/tongue/throat swelling, SOB or lightheadedness with hypotension: No Has patient had a PCN reaction causing severe rash involving mucus membranes or skin necrosis:  Has patient had a PCN reaction that required hospitalization: No Has patient had a PCN reaction occurring within the last 10 years: No If all of the above answers are "NO", then may proceed with Cephalosporin us  . Sulfamethoxazole-Trimethoprim     REACTION: severe rash     Past Medical History:  Diagnosis Date  . AIDS ([facility]) 07/12/2014  . Anxiety   . Arthritis    "hips, shoulders" (03/21/2017)  . Bilateral bunions    left foot worse  . Coronary artery disease involving native coronary artery 01/12/2015  . Diabetes mellitus type 2, controlled, without complications ([facility]) 01/12/2015  . Diabetes type 2, controlled ([facility]) 07/12/2014  . High triglycerides   . HTN (hypertension)   . Human immunodeficiency virus (HIV) disease ([facility]) d'x 09/2007  . Hyperlipidemia   . Hypothyroid   . 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 Tobacco/alcohol: denied social drinker  Pt orientation  to unit, room and routine. Information packet given to patient/family.  Admission INP armband ID verified with patient/family, and in place. SR up x 2, fall risk assessment complete with Patient and family verbalizing understanding of risks associated with falls. Pt verbalizes an understanding of how to use the call bell and to call for help before getting out of bed.  Skin, clean-dry- intact has bruising on lower back, no skin tears.   No evidence of skin break down noted on exam. no rashes, no ecchymoses, no petechiae    Will cont to monitor and assist as needed.  Marykate Heuberger Consuella Loselaine, RN 03/21/2017 5:56 PM

## 2017-03-21 NOTE — Consult Note (Signed)
[facility]    Date of Admission:  03/21/2017              Reason for Consult: Neurosyphilis   Referring Provider: Ophelia Estes Primary Care Provider: Thomasene Lotpalski, Deborah, DO   Assessment/Plan:  [name] is a 62 year old male presenting from ophthalmology and ID office with concern for neurosyphilis or ocular syphilis. He has no other symptoms currently other than a "dimming" of the vision of his right eye. He did have two different episodes of rashes one to his palms and feet and one to his torso. Previous syphilis testing has been negative but the concern for a prozone effect resulting in a negative RPR test.  Unlikely TB or Lyme as suggested by ophthalmology. It does not appear that he has a true penicillin allergy as previous reactions did   Change in vision - Possibly related to neurosyphilis or ocular syphilis. Reasonable to treat with blood work drawn in the ID office pending. He does not appear to have a true penicillin allergy and I believe that it will safe to treat him with Penicillin. We will continue to follow labs and monitor.   HIV - Stable with most recent CD4 of 813 and viral load less than 40. Continue Biktarvy.    Principal Problem:   Change in vision Active Problems:   Human immunodeficiency virus (HIV) disease (HCC)   HPI: Raegan B Jonathan Estes is a 62 y.o. male with previous medical history of PCP, hypothyroidism, HIV, Type 2 diabetes, CAD, and anxiety who presents to the hospital for uveitis.  He was seen by Ophthalmology on for retina evaluation and his diabetic eye exam also having had blurred vision in the right eye starting 1 day prior to his office visit. Described as feeling not right and "dark." Denied any trauma or injury. 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 He had a negative RPR in July of 2018 with concern for possible prozone effect. A prozone dilution for RPR was sent as well as FTA Ab.   Was in his normal state of health when he awoke 2 days ago with a "dimming" of his right eye. The change in vision has been stable since onset and appears the same following dilation by the ophthalmologist. Denies trauma or injury. No recent sexual partners, fevers, chills, night sweats, or headaches.  Review of Systems: Review of Systems  Constitutional: Negative for chills and fever.  Eyes: Positive for blurred vision (Described as dimming ). Negative for double vision, photophobia, pain, discharge and redness.  Respiratory: Negative for cough, shortness of breath and wheezing.   Cardiovascular: Negative for chest pain and leg swelling.  Gastrointestinal: Negative for constipation, diarrhea, nausea and vomiting.  Skin: Negative for rash.  Neurological: Negative for weakness.     Past Medical History:  Diagnosis Date  . AIDS (HCC) 07/12/2014  . Anxiety   . Bilateral bunions    left foot worse  . Coronary artery disease involving native coronary artery 01/12/2015  . Diabetes mellitus   . Diabetes mellitus type 2, controlled, without complications (HCC) 01/12/2015  . Diabetes type 2, controlled (HCC) 07/12/2014  . HTN (hypertension)   . Human immunodeficiency virus (HIV) disease (HCC)   . Hyperlipidemia   . Hypothyroid   . Pneumocystis carinii pneumonia (HCC)  Social History   Tobacco Use  . Smoking status: Never Smoker  . Smokeless tobacco: Never Used  Substance Use Topics  . Alcohol use: Yes    Alcohol/week: 0.5 oz    Types: 1 Standard drinks or equivalent per week  . Drug use: Yes    Frequency: 5.0 times per week    Types: Marijuana, Cocaine    Comment: Smokes marijuana daily!    Family History  Problem Relation Age of Onset  . Cancer Father        Colon  . Colon cancer Father   . Leukemia Maternal Grandfather       Allergies  Allergen Reactions  . Penicillins     Patient couldn't urinate and had body aches  Has patient had a PCN reaction causing immediate rash, facial/tongue/throat swelling, SOB or lightheadedness with hypotension: No Has patient had a PCN reaction causing severe rash involving mucus membranes or skin necrosis:  Has patient had a PCN reaction that required hospitalization: No Has patient had a PCN reaction occurring within the last 10 years: No If all of the above answers are "NO", then may proceed with Cephalosporin use.No  . Sulfamethoxazole-Trimethoprim     REACTION: severe rash    OBJECTIVE: Blood pressure (!) 161/73, pulse 79, temperature 97.8 F (36.6 C), temperature source Oral, resp. rate 18, SpO2 97 %.  Physical Exam  Constitutional: He is oriented to person, place, and time and well-developed, well-nourished, and in no distress. No distress.  Eyes: Conjunctivae and EOM are normal. Pupils are equal, round, and reactive to light. Right eye exhibits no discharge. Left eye exhibits no discharge. No scleral icterus.  Cardiovascular: Normal rate, regular rhythm, normal heart sounds and intact distal pulses. Exam reveals no gallop and no friction rub.  No murmur heard. Pulmonary/Chest: Effort normal and breath sounds normal. No respiratory distress. He has no wheezes. He has no rales. He exhibits no tenderness.  Abdominal: Soft. Bowel sounds are normal. He exhibits no distension and no mass. There is no tenderness. There is no rebound and no guarding.  Neurological: He is alert and oriented to person, place, and time.  Skin: Skin is warm and dry.  Psychiatric: Mood, memory, affect and judgment normal.    Lab Results Lab Results  Component Value Date   WBC 7.5 11/26/2016   HGB 13.6 11/26/2016   HCT 39.1 11/26/2016   MCV 89 11/26/2016   PLT 278 11/26/2016    Lab Results  Component Value Date   CREATININE 1.03 02/06/2017   BUN 16 02/06/2017   NA 136 02/06/2017    K 4.2 02/06/2017   CL 99 02/06/2017   CO2 28 02/06/2017    Lab Results  Component Value Date   ALT 11 02/06/2017   AST 16 02/06/2017   ALKPHOS 123 (H) 11/26/2016   BILITOT 0.4 02/06/2017     Microbiology: No results found for this or any previous visit (from the past 240 hour(s)).   Marcos Eke, NP [facility] [phone] Pager  03/21/2017  3:39 PM

## 2017-03-21 NOTE — Assessment & Plan Note (Addendum)
Strong suspicion he had false negative RPR due to prozone effect this past summer and I would be very surprised if this is not syphilitic related given his history. Will check the labs requested by his ophthalmologist here in clinic to get those labs started. I discussed with our phlebotomy team we should go ahead and request a prozone dilution for RPR sample if negative. Will check FTA Ab also.   He will need to be started on continuous IV penicillin. He should have an LP with cell count/diff, glucose, protein, with CSF VDRL and culture. He will follow up with ophthalmology outpatient.   Of note he needs observation to monitor for adverse reaction to penicillin as it is a listed allergy. It does not sound like an anaphylaxis reaction when he was younger but nonetheless warrants observation during dosing.

## 2017-03-21 NOTE — Progress Notes (Signed)
Jonathan Estes  June 08, 1955 [id number] Mellody Dance, DO   Reason for Visit: suspicious of occular syphilis   Patient Active Problem List   Diagnosis Date Noted  . Uveitis 03/21/2017  . Change in vision 03/21/2017  . Microalbuminuria due to type 2 diabetes mellitus ([place]) 12/31/2016  . Vitamin D deficiency 12/31/2016  . Low level of high density lipoprotein (HDL) 12/30/2016  . Hypertriglyceridemia 12/30/2016  . Family history of colon cancer in father- in 35's 11/26/2016  . External hemorrhoids without complication 80/32/1224  . Environmental and seasonal allergies 07/30/2016  . Dyslipidemia with low high density lipoprotein (HDL) cholesterol with hypertriglyceridemia due to type 2 diabetes mellitus ([place]) 07/23/2016  . Diabetic nephropathy associated with type 2 diabetes mellitus: Microalbuminuria 07/23/2016  . CAD (coronary artery disease) 07/16/2011  . Dyspnea 05/10/2011  . Abnormal chest CT 04/12/2011  . HTN (hypertension)   . Anxiety 01/29/2011  . Mixed diabetic hyperlipidemia associated with type 2 diabetes mellitus ([place]) 07/11/2010  . PULMONARY NODULE 02/21/2010  . ORBITAL FLOOR , CLOSED FRACTURE 02/21/2010  . UNSPECIFIED VISUAL LOSS 12/26/2009  . TRANSIENT GLOBAL AMNESIA 12/26/2009  . DEPRESSION/ANXIETY 06/27/2009  . SHOULDER STRAIN 06/13/2009  . Controlled type 2 diabetes mellitus without complication, without long-term current use of insulin ([place]) 02/07/2009  . Hypothyroidism 04/24/2008  . Hypertension associated with diabetes ([place]) 04/12/2008  . FATIGUE 04/12/2008  . METHICILLIN SUSCEPTIBLE STAPH INF CCE & UNS SITE 10/14/2007  . Human immunodeficiency virus (HIV) disease ([place]) 10/14/2007  . CANDIDIASIS OF MOUTH 10/14/2007  . PNEUMOCYSTIS PNEUMONIA 10/14/2007  . h/o COCAINE ABUSE, EPISODIC 10/14/2007  . CUTANEOUS ERUPTIONS, DRUG-INDUCED 10/14/2007  . CANNABIS ABUSE, HX OF 10/14/2007    Patient's Medications  New Prescriptions   No medications on file    Previous Medications   ALPRAZOLAM (XANAX) 0.5 MG TABLET    TAKE 1 TABLET BY MOUTH 3 TIMES A DAY AS NEEDED FOR ANXIETY   BICTEGRAVIR-EMTRICITABINE-TENOFOVIR AF (BIKTARVY) 50-200-25 MG TABS TABLET    Take 1 tablet by mouth daily.   CETIRIZINE (ZYRTEC) 10 MG TABLET    TAKE 1 TABLET (10 MG TOTAL) BY MOUTH DAILY.   CHOLECALCIFEROL (VITAMIN D3) 5000 UNITS CAPS    Take 1 capsule by mouth.   [employer] LANCETS ORIGINAL MISC    1 application by Does not apply route daily.   GEMFIBROZIL (LOPID) 600 MG TABLET    1/2 tab twice daily   GLUCOSE BLOOD (ACCU-CHEK AVIVA) TEST STRIP    Use as instructed   HYDROCHLOROTHIAZIDE (MICROZIDE) 12.5 MG CAPSULE    TAKE 1 CAPSULE BY MOUTH EVERY DAY   LEVOTHYROXINE (SYNTHROID, LEVOTHROID) 25 MCG TABLET    TAKE 1 TABLET BY MOUTH EVERY DAY ON AN EMPTY STOMACH   LISINOPRIL (PRINIVIL,ZESTRIL) 40 MG TABLET    TAKE 1 TABLET BY MOUTH EVERY DAY   METFORMIN (GLUCOPHAGE) 500 MG TABLET    Take 1 tablet (500 mg total) by mouth 2 (two) times daily with a meal.   MISC NATURAL PRODUCTS (OSTEO BI-FLEX ADV DOUBLE ST PO)    Take 1 tablet by mouth daily.   MULTIPLE VITAMINS-MINERALS (ICAPS AREDS 2 PO)    Take by mouth.   MULTIVITAMIN-LUTEIN (OCUVITE-LUTEIN) CAPS CAPSULE    Take 1 capsule by mouth daily.   PRAVASTATIN (PRAVACHOL) 20 MG TABLET    Take 1 tablet (20 mg total) by mouth at bedtime.   TETRAHYDROZOLINE 0.05 % OPHTHALMIC SOLUTION    Place 1 drop into both eyes as needed (dry eyes).   ZINC GLUCONATE  50 MG TABLET    Take 50 mg by mouth daily.  Modified Medications   No medications on file  Discontinued Medications   No medications on file    HPI:  Jonathan Estes is a very pleasant 62 y.o. patient of Dr. Lucianne Lei Dam's that is here today after evaluation with his ophthalmologist after he experienced changes in his vision over the last few days consisting of intermittent vision loss, haloing and blurriness involving his right eye. Differential from ophthalmologist is broad but syphilis is a  possibility.   Jonathan Estes was here in August of 2018 with complaints of a nun-pruritic rash on upper body. He described this to be mostly a spotty flat rash that covered his torso and down to his upper thighs. He also noticed some spots on his hands and feet that he attributed to recurrent and spreading plantar warts. He did come in and have RPR at that time but it was non-reactive. He attributed rash to a new tshirt he wore and noticed that it had faded completely by September.   Today he describes that his right eye is very blurry and everything looks to be 'covered in sand.' He denies fevers and headaches. He has some occasional neck discomfort that has been present for many years and relieved by massage and heat. When asked about exposure regarding new partners he admits that there is a possibility he had new partners about a year ago when he was drinking heavily. He has not had any consistent alcohol since the summer outside a shot of vodka and a beer a few weeks ago. He took pcn as a child and has not had it since. Cannot exactly remember what happened when he took this but thinks it was something related to GI intolerance.   Review of Systems  All other systems reviewed and are negative.   Past Medical History:  Diagnosis Date  . AIDS ([employer]) 07/12/2014  . Anxiety   . Bilateral bunions    left foot worse  . Coronary artery disease involving native coronary artery 01/12/2015  . Diabetes mellitus   . Diabetes mellitus type 2, controlled, without complications ([place]) 78/04/9560  . Diabetes type 2, controlled ([place]) 07/12/2014  . HTN (hypertension)   . Human immunodeficiency virus (HIV) disease ([place])   . Hyperlipidemia   . Hypothyroid   . Pneumocystis carinii pneumonia (HCC)     Allergies  Allergen Reactions  . Penicillins     Patient couldn't urinate and had body aches  Has patient had a PCN reaction causing immediate rash, facial/tongue/throat swelling, SOB or lightheadedness with hypotension:  No Has patient had a PCN reaction causing severe rash involving mucus membranes or skin necrosis:  Has patient had a PCN reaction that required hospitalization: No Has patient had a PCN reaction occurring within the last 10 years: No If all of the above answers are "NO", then may proceed with Cephalosporin use.No  . Sulfamethoxazole-Trimethoprim     REACTION: severe rash    Social History   Tobacco Use  . Smoking status: Never Smoker  . Smokeless tobacco: Never Used  Substance Use Topics  . Alcohol use: Yes    Alcohol/week: 0.5 oz    Types: 1 Standard drinks or equivalent per week  . Drug use: Yes    Frequency: 5.0 times per week    Types: Marijuana, Cocaine    Comment: Smokes marijuana daily!    Family History  Problem Relation Age of Onset  . Cancer Father  Colon  . Colon cancer Father   . Leukemia Maternal Grandfather     Social History   Substance and Sexual Activity  Sexual Activity Not Currently  . Partners: Female    Physical Exam and Objective Findings:  Vitals:   03/21/17 0941  BP: (!) 152/88  Pulse: 77  Temp: 98 F (36.7 C)  TempSrc: Oral  Weight: 190 lb (86.2 kg)   Body mass index is 27.66 kg/m.  Physical Exam  Constitutional: He is oriented to person, place, and time and well-developed, well-nourished, and in no distress.  HENT:  Mouth/Throat: Oropharynx is clear and moist. No oral lesions. Normal dentition. No dental caries.  Eyes: Conjunctivae and EOM are normal. Pupils are equal, round, and reactive to light. Right eye exhibits no discharge. Left eye exhibits no discharge. No scleral icterus.  Neck: Normal range of motion.  Cardiovascular: Normal rate, regular rhythm and normal heart sounds.  Pulmonary/Chest: Effort normal and breath sounds normal.  Abdominal: Soft. He exhibits no distension. There is no tenderness.  Musculoskeletal: Normal range of motion.  Lymphadenopathy:    He has no cervical adenopathy.  Neurological: He is  alert and oriented to person, place, and time. No cranial nerve deficit. Gait normal. Coordination normal.  Skin: Skin is warm and dry. No rash noted.  Psychiatric: Mood and affect normal.    Lab Results Lab Results  Component Value Date   WBC 7.5 11/26/2016   HGB 13.6 11/26/2016   HCT 39.1 11/26/2016   MCV 89 11/26/2016   PLT 278 11/26/2016    Lab Results  Component Value Date   CREATININE 1.03 02/06/2017   BUN 16 02/06/2017   NA 136 02/06/2017   K 4.2 02/06/2017   CL 99 02/06/2017   CO2 28 02/06/2017    Lab Results  Component Value Date   ALT 11 02/06/2017   AST 16 02/06/2017   ALKPHOS 123 (H) 11/26/2016   BILITOT 0.4 02/06/2017    Lab Results  Component Value Date   CHOL 130 11/26/2016   HDL 26 (L) 11/26/2016   LDLCALC 61 11/26/2016   TRIG 215 (H) 11/26/2016   CHOLHDL 5.0 11/26/2016   HIV 1 RNA Quant  Date Value  12/01/2015 <40  12/01/2015 <40  03/18/2012 <20 copies/mL   HIV-1 RNA Viral Load (no units)  Date Value  02/06/2017 <40  10/04/2016 <40  05/30/2016 <40   CD4 (no units)  Date Value  01/12/2015 641  05/25/2014 670  02/18/2014 713   Lab Results  Component Value Date   HAV NEG 08/31/2009   Lab Results  Component Value Date   HEPBSAG NEGATIVE 12/01/2015   HEPBSAB NEG 12/01/2015   Lab Results  Component Value Date   HCVAB NEGATIVE 05/30/2016   Lab Results  Component Value Date   CHLAMYDIAWP Negative 01/12/2015   N Negative 01/12/2015   No results found for: GCPROBEAPT No results found for: QUANTGOLD No results found for: RPR    Problem List Items Addressed This Visit      Other   Human immunodeficiency virus (HIV) disease ([place]) (Chronic)    HIV has been well controlled on Biktarvy in reviewing previous labs. Will check VL and CD4 as it appears he is due.   HIV 1 RNA Quant  Date Value  12/01/2015 <40  12/01/2015 <40  03/18/2012 <20 copies/mL   HIV-1 RNA Viral Load (no units)  Date Value  02/06/2017 <40  10/04/2016 <40    05/30/2016 <40   CD4 (no  units)  Date Value  01/12/2015 641  05/25/2014 670  02/18/2014 713         Relevant Orders   HIV 1 RNA quant-no reflex-bld   T-helper cell (CD4)- ([facility] clinic only)   0ther [facility] Test   Uveitis - Primary    Strong suspicion he had false negative RPR due to prozone effect this past summer and I would be very surprised if this is not syphilitic related given his history. Will check the labs requested by his ophthalmologist here in clinic to get those labs started. I discussed with our phlebotomy team we should go ahead and request a prozone dilution for RPR sample if negative. Will check FTA Ab also.   He will need to be started on continuous IV penicillin. He should have an LP with cell count/diff, glucose, protein, with CSF VDRL and culture. He will follow up with ophthalmology outpatient.   Of note he needs observation to monitor for adverse reaction to penicillin as it is a listed allergy. It does not sound like an anaphylaxis reaction when he was younger but nonetheless warrants observation during dosing.       Relevant Orders   Media planner Test    Other Visit Diagnoses    Chorioretinitis, unspecified laterality       Relevant Orders   C-reactive protein   Lysozyme, serum   RPR   Fluorescent treponemal ab(fta)-IgG-bld   Angiotensin converting enzyme   Sedimentation rate   Comp Met (CMET)   CBC   QuantiFERON-TB Gold Plus   VDRL, Serum   0ther [facility] Test     Discussed with Dr. Lorin Mercy with [facility] and they will accept the patient. Although not acutely ill he is at high risk of disease progression and vision loss should this not be treated promptly with IV PCN.   Janene Madeira, MSN, NP-C [facility] [place] Pager: [phone]  03/21/2017  3:59 PM

## 2017-03-21 NOTE — H&P (Signed)
History and Physical    Jonathan Estes [id number] DOB: 03/24/1955 DOA: 03/21/2017  PCP: Thomasene Lotpalski, Deborah, DO Consultants:  Infectious disease Patient coming from: Home  Chief Complaint: blurred vision  HPI: Jonathan Estes is a 62 y.o. male with medical history significant of HIV compliant with HAART, DM2, htn, hyperlipidemia, CAD, depression/anxiety, hypothyroidism and hx of cocaine abuse sent from ID office for further evaluation for ocular or neurosyphilis. Pt reports waking up Tuesday morning with blurred and dark vision in right eye. Primary ophthalmologist referred pt to retina specialist for further evaluation who was concerned for neurosyphilis or ocular syphilis prompting admission. Previous syphilis testing was negative and repeated today. Pt also describes 2 different rashes since August. One rash covering his chest and another "cauliflower" rash covering palms and feet.   There was also concern for previous PCN reaction though upon further discussion with the pt, this does not seem to be the case. He describes urinary retention "years" ago after PCN shot that resolved in a day along with feeling unwell. He denies any sob, swelling or hives. Pt states vision unchanged since Tuesday am. He denies recent cough, fever, chest pain, sob, abdominal pain, n/v/d or rash.      Review of Systems: As per HPI; otherwise review of systems reviewed and negative.   Ambulatory Status: Ambulates without assistance  Past Medical History:  Diagnosis Date  . AIDS (HCC) 07/12/2014  . Anxiety   . Bilateral bunions    left foot worse  . Coronary artery disease involving native coronary artery 01/12/2015  . Diabetes mellitus   . Diabetes mellitus type 2, controlled, without complications (HCC) 01/12/2015  . Diabetes type 2, controlled (HCC) 07/12/2014  . HTN (hypertension)   . Human immunodeficiency virus (HIV) disease (HCC)   . Hyperlipidemia   . Hypothyroid   . Pneumocystis carinii pneumonia  [facility](HCC)     Past Surgical History:  Procedure Laterality Date  . LUNG BIOPSY    . None      Social History   Socioeconomic History  . Marital status: Single    Spouse name: Not on file  . Number of children: Not on file  . Years of education: Not on file  . Highest education level: Not on file  Social Needs  . Financial resource strain: Not on file  . Food insecurity - worry: Not on file  . Food insecurity - inability: Not on file  . Transportation needs - medical: Not on file  . Transportation needs - non-medical: Not on file  Occupational History  . Not on file  Tobacco Use  . Smoking status: Never Smoker  . Smokeless tobacco: Never Used  Substance and Sexual Activity  . Alcohol use: Yes    Alcohol/week: 0.5 oz    Types: 1 Standard drinks or equivalent per week  . Drug use: Yes    Frequency: 5.0 times per week    Types: Marijuana, Cocaine    Comment: Smokes marijuana daily!  . Sexual activity: Not Currently    Partners: Female  Other Topics Concern  . Not on file  Social History Narrative   Lives alone.          Allergies  Allergen Reactions  . Penicillins     Patient couldn't urinate and had body aches - Not a true allergy  Has patient had a PCN reaction causing immediate rash, facial/tongue/throat swelling, SOB or lightheadedness with hypotension: No Has patient had a PCN reaction causing severe rash involving mucus  membranes or skin necrosis:  Has patient had a PCN reaction that required hospitalization: No Has patient had a PCN reaction occurring within the last 10 years: No If all of the above answers are "NO", then may proceed with Cephalosporin [place]  . Sulfamethoxazole-Trimethoprim     REACTION: severe rash    Family History  Problem Relation Age of Onset  . Cancer Father        Colon  . Colon cancer Father   . Leukemia Maternal Grandfather     Prior to Admission medications   Medication Sig Start Date End Date Taking? Authorizing Provider    ALPRAZolam Prudy Feeler) 0.5 MG tablet TAKE 1 TABLET BY MOUTH 3 TIMES A DAY AS NEEDED FOR ANXIETY 02/21/17  Yes Daiva Eves, Lisette Grinder, MD  bictegravir-emtricitabine-tenofovir AF (BIKTARVY) 50-200-25 MG TABS tablet Take 1 tablet by mouth daily. 02/06/17  Yes Daiva Eves, Lisette Grinder, MD  cetirizine (ZYRTEC) 10 MG tablet TAKE 1 TABLET (10 MG TOTAL) BY MOUTH DAILY. 08/03/14  Yes Daiva Eves, Lisette Grinder, MD  Cholecalciferol (VITAMIN D3) 5000 units CAPS Take 1 capsule by mouth.   Yes [provider]  gemfibrozil (LOPID) 600 MG tablet 1/2 tab twice daily 12/31/16  Yes Opalski, Deborah, DO  hydrochlorothiazide (MICROZIDE) 12.5 MG capsule TAKE 1 CAPSULE BY MOUTH EVERY DAY 02/13/17  Yes Opalski, Deborah, DO  levothyroxine (SYNTHROID, LEVOTHROID) 25 MCG tablet TAKE 1 TABLET BY MOUTH EVERY DAY ON AN EMPTY STOMACH 02/27/17  Yes Opalski, Gavin Pound, DO  lisinopril (PRINIVIL,ZESTRIL) 40 MG tablet TAKE 1 TABLET BY MOUTH EVERY DAY 12/28/16  Yes Opalski, Gavin Pound, DO  metFORMIN (GLUCOPHAGE) 500 MG tablet Take 1 tablet (500 mg total) by mouth 2 (two) times daily with a meal. 02/07/17  Yes Opalski, Deborah, DO  Misc Natural Products (OSTEO BI-FLEX ADV DOUBLE ST PO) Take 1 tablet by mouth daily.   Yes [provider]  Multiple Vitamins-Minerals (ICAPS AREDS 2 PO) Take by mouth.   Yes [provider]  multivitamin-lutein (OCUVITE-LUTEIN) CAPS capsule Take 1 capsule by mouth daily.   Yes [provider]  pravastatin (PRAVACHOL) 20 MG tablet Take 1 tablet (20 mg total) by mouth at bedtime. 12/31/16  Yes Opalski, Deborah, DO  tetrahydrozoline 0.05 % ophthalmic solution Place 1 drop into both eyes as needed (dry eyes).   Yes [provider]  zinc gluconate 50 MG tablet Take 50 mg by mouth daily.   Yes [provider]  [employer] LANCETS ORIGINAL MISC 1 application by Does not apply route daily. 12/21/13   Randall Hiss, MD  glucose blood (ACCU-CHEK AVIVA) test strip Use as instructed  12/21/13   Daiva Eves, Lisette Grinder, MD    Physical Exam: Vitals:   03/21/17 1452  BP: (!) 161/73  Pulse: 79  Resp: 18  Temp: 97.8 F (36.6 C)  TempSrc: Oral  SpO2: 97%     General: sitting upright in chair in NAD Eyes: PERRL, EOMI, normal lids, iris ENT: grossly normal hearing, lips & tongue, mmm; appropriate dentition Neck: no LAD, masses or thyromegaly; no carotid bruits Cardiovascular: RRR, no m/r/g. No LE edema.  Respiratory:  CTA bilaterally with no wheezes/rales/rhonchi.  Normal respiratory effort. Abdomen: soft, NT, ND, NABS Skin: no rash or induration seen on limited exam Musculoskeletal: grossly normal tone BUE/BLE, good ROM, no bony abnormality Lower extremity: No LE edema.  Limited foot exam with no ulcerations.  2+ distal pulses. Psychiatric: very pleasant mood and affect, speech fluent and appropriate, AOx3 Neurologic: CN 2-12  grossly intact, moves all extremities in coordinated fashion, sensation intact    Radiological Exams on Admission: No results found.  EKG: none   Labs on Admission: none available at time of admit    Assessment/Plan Principal Problem:   Change in vision Active Problems:   Human immunodeficiency virus (HIV) disease (HCC)   Controlled type 2 diabetes mellitus without complication, without long-term current use of insulin (HCC)   DEPRESSION/ANXIETY   Anxiety   HTN (hypertension)   Dyslipidemia with low high density lipoprotein (HDL) cholesterol with hypertriglyceridemia due to type 2 diabetes mellitus (HCC)   Environmental and seasonal allergies   Diabetic nephropathy associated with type 2 diabetes mellitus: Microalbuminuria   Vitamin D deficiency   Uveitis   Uveitis, right eye -ID following d/t concern for neurosyphilis vs ocular syphilis. Pt seen by retina specialist yesterday -empiric treatment with PCN per ID as it does not appear pt ever had an allergic rxn -RPR repeated by ID. Will defer LP to ID  HIV -recent CD4 813,  viral load <40 -continue Biktarvy  DM2 -A1C 10/2016 6.0% -will continue home meds as pt eating well   Hypothyroidism -followed closely as outpt. Last TSH 11/2016 4.8 -continue Synthroid  Vit D deficiency -continue home meds  Depression/anxiety -stable. Continue home meds.   Hypercholesterolemia -resume statin as d/c  Hypertension -continue home hctz, ACEI   DVT prophylaxis: frequent ambulation Code Status: Full  Family Communication: none Disposition Plan: Home once clinically improved Consults called: ID  Admission status: inpatient   Cordelia Pen, NP-C [facility]  pgr [phone]   If note is complete, please contact covering daytime or nighttime physician. [email or web address] Password [facility]  03/21/2017, 4:38 PM

## 2017-03-22 ENCOUNTER — Inpatient Hospital Stay (HOSPITAL_COMMUNITY): Payer: Medicare Other

## 2017-03-22 ENCOUNTER — Encounter (INDEPENDENT_AMBULATORY_CARE_PROVIDER_SITE_OTHER): Payer: Self-pay | Admitting: Ophthalmology

## 2017-03-22 DIAGNOSIS — A5143 Secondary syphilitic oculopathy: Secondary | ICD-10-CM

## 2017-03-22 DIAGNOSIS — Z95828 Presence of other vascular implants and grafts: Secondary | ICD-10-CM

## 2017-03-22 DIAGNOSIS — Z21 Asymptomatic human immunodeficiency virus [HIV] infection status: Secondary | ICD-10-CM

## 2017-03-22 LAB — T-HELPER CELL (CD4) - (RCID CLINIC ONLY)
CD4 % Helper T Cell: 14 % — ABNORMAL LOW (ref 33–55)
CD4 T CELL ABS: 440 /uL (ref 400–2700)

## 2017-03-22 LAB — CMV IGM: CMV IgM: <30 AU/mL (ref 0.0–29.9)

## 2017-03-22 LAB — CMV ANTIBODY, IGG (EIA): CMV Ab - IgG: >10 U/mL — ABNORMAL HIGH (ref 0.00–0.59)

## 2017-03-22 MED ORDER — ENOXAPARIN SODIUM 40 MG/0.4ML ~~LOC~~ SOLN
40.0000 mg | SUBCUTANEOUS | Status: DC
  Administered 2017-03-22: 40 mg via SUBCUTANEOUS
  Filled 2017-03-22: qty 0.4

## 2017-03-22 MED ORDER — PENICILLIN G BENZATHINE 1200000 UNIT/2ML IM SUSP
2.4000 10*6.[IU] | Freq: Once | INTRAMUSCULAR | Status: AC
  Administered 2017-03-22: 2.4 10*6.[IU] via INTRAMUSCULAR
  Filled 2017-03-22: qty 4

## 2017-03-22 MED ORDER — PENICILLIN G POTASSIUM IV (FOR PTA / DISCHARGE USE ONLY): 24.0000 10*6.[IU] | INTRAVENOUS | 0 refills | Status: AC

## 2017-03-22 NOTE — Progress Notes (Signed)
[facility] for [name] to support home IV ABX at DC to home.  [facility] is prepared for weekend DC.   Script for IV ABX [facility] Epic [facility] and is awaiting MD signature.  If patient discharges after hours, please call [phone].   Sedalia Mutaamela S Chandler 03/22/2017, 2:37 PM

## 2017-03-22 NOTE — Progress Notes (Signed)
[facility]  [facility] for visit on Saturday between 2-4 PM based on DC time and when PICC placed.  If late DC, [facility].  If patient discharges after hours, please call [phone].   Jonathan Estes 03/22/2017, 5:45 PM

## 2017-03-22 NOTE — Progress Notes (Addendum)
    [facility]   Reason for visit: Follow up on syphilis uveitis Interval History: RPR positive, vision improving, no other tests positive.  No LP yet done (and doesn't need); no fever, no chills.    Physical Exam: Constitutional:  Vitals:   03/21/17 2145 03/22/17 0609  BP: 108/69 114/71  Pulse: 79 75  Resp: 18 18  Temp: 98.3 F (36.8 C) 98.3 F (36.8 C)  SpO2: 97% 97%   patient appears in NAD Eyes: anicteric HENT: no thrush Respiratory: Normal respiratory effort; CTA B Cardiovascular: RRR GI: soft, nt, nd  Review of Systems: Constitutional: negative for fevers, chills and malaise Gastrointestinal: negative for diarrhea  Lab Results  Component Value Date   WBC 10.9 (H) 03/21/2017   HGB 14.7 03/21/2017   HCT 42.7 03/21/2017   MCV 93.8 03/21/2017   PLT 243 03/21/2017    Lab Results  Component Value Date   CREATININE 0.97 03/21/2017   BUN 15 03/21/2017   NA 136 03/21/2017   K 3.4 (L) 03/21/2017   CL 98 (L) 03/21/2017   CO2 25 03/21/2017    Lab Results  Component Value Date   ALT 16 (L) 03/21/2017   AST 21 03/21/2017   ALKPHOS 99 03/21/2017     Microbiology: No results found for this or any previous visit (from the past 240 hour(s)).  Impression/Plan:  1. 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 Vanessa BarbaraZamora.   picc line IV pencillin through January 30th OPAT consult wiill need follow up with Ophthalmology  2.  HIV - continue Biktarvy  3.  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  Willl give him one dose of IM Bicillin  OK from ID standpoint for d/c when all is arranged.

## 2017-03-22 NOTE — Progress Notes (Addendum)
PROGRESS NOTE    Jonathan Estes  [id number] DOB: Apr 10, 1955 DOA: 03/21/2017 PCP: [name], DO  Outpatient Specialists:   Brief Narrative: Patient is a 62 year old male with Past Medical History significant for AIDS with h/o PJP; hypothyroidism; HLD; HTN; DM; and CAD. Patient presented with right eye visual loss. Apparently, patient went to his Ophthalmologist and there were concerns for possible Neuro syphilis. RPR is positive. Patient is currently on treatment for possible Syphilis uveitis. ID input is appreciated. For PICC line placement and 10 day total treatment of IV Penicillin G (Continuous infusion)  Assessment & Plan:   Principal Problem:   Syphilis Uveitis   Change in vision Active Problems:   Human immunodeficiency virus (HIV) disease (HCC)   Controlled type 2 diabetes mellitus without complication, without long-term current use of insulin (HCC)   DEPRESSION/ANXIETY   Anxiety   HTN (hypertension)   Dyslipidemia with low high density lipoprotein (HDL) cholesterol with hypertriglyceridemia due to type 2 diabetes mellitus (HCC)   Environmental and seasonal allergies   Diabetic nephropathy associated with type 2 diabetes mellitus: Microalbuminuria   Vitamin D deficiency   Uveitis   - Continue IV Penicillin G - For Piccline placement - Likely DC after Piccline is placed and home antibiotics arranged - Optimize Blood sugar - Continue HIV Treatment  DVT prophylaxis: Ardoch Lovenox Code Status: Full Family Communication:  Disposition Plan: Home with Home IV antibiotics   Consultants:   ID  Procedures:   None  Antimicrobials:   Penicillin G    Subjective: Right sided visual loss is gradually improving  Objective: Vitals:   03/21/17 1800 03/21/17 2145 03/22/17 0609 03/22/17 1328  BP:  108/69 114/71 135/80  Pulse:  79 75 77  Resp:  18 18 19   Temp:  98.3 F (36.8 C) 98.3 F (36.8 C) 98.8 F (37.1 C)  TempSrc:   Oral Oral  SpO2:  97% 97% 100%    Weight: 86.2 kg (190 lb)     Height: 5\' 9"  (1.753 m)       Intake/Output Summary (Last 24 hours) at 03/22/2017 1730 Last data filed at 03/22/2017 1328 Gross per 24 hour  Intake 720 ml  Output -  Net 720 ml   Filed Weights   03/21/17 1800  Weight: 86.2 kg (190 lb)    Examination:  General exam: Appears calm and comfortable  Respiratory system: Clear to auscultation.  . Cardiovascular system: S1 & S2. No pedal edema. Gastrointestinal system: Abdomen is nondistended, soft and nontender. No organomegaly or masses felt. Normal bowel sounds heard. Central nervous system: Alert and oriented. Right sided visual loss Extremities: Symmetric 5 x 5 power.    Data Reviewed: I have personally reviewed following labs and imaging studies  CBC: Recent Labs  Lab 03/21/17 1037 03/21/17 1823  WBC 13.4* 10.9*  NEUTROABS  --  5.9  HGB 15.6 14.7  HCT 43.9 42.7  MCV 90.3 93.8  PLT 295 243   Basic Metabolic Panel: Recent Labs  Lab 03/21/17 1037 03/21/17 1823  NA 137 136  K 4.1 3.4*  CL 99 98*  CO2 26 25  GLUCOSE 113* 106*  BUN 16 15  CREATININE 0.99 0.97  CALCIUM 10.0 9.8   GFR: Estimated Creatinine Clearance: 89.2 mL/min (by C-G formula based on SCr of 0.97 mg/dL). Liver Function Tests: Recent Labs  Lab 03/21/17 1037 03/21/17 1823  AST 18 21  ALT 16 16*  ALKPHOS  --  99  BILITOT 0.4 0.7  PROT 8.2*  8.3*  ALBUMIN  --  4.3   No results for input(s): LIPASE, AMYLASE in the last 168 hours. No results for input(s): AMMONIA in the last 168 hours. Coagulation Profile: No results for input(s): INR, PROTIME in the last 168 hours. Cardiac Enzymes: No results for input(s): CKTOTAL, CKMB, CKMBINDEX, TROPONINI in the last 168 hours. BNP (last 3 results) No results for input(s): PROBNP in the last 8760 hours. HbA1C: No results for input(s): HGBA1C in the last 72 hours. CBG: No results for input(s): GLUCAP in the last 168 hours. Lipid Profile: No results for input(s): CHOL,  HDL, LDLCALC, TRIG, CHOLHDL, LDLDIRECT in the last 72 hours. Thyroid Function Tests: No results for input(s): TSH, T4TOTAL, FREET4, T3FREE, THYROIDAB in the last 72 hours. Anemia Panel: No results for input(s): VITAMINB12, FOLATE, FERRITIN, TIBC, IRON, RETICCTPCT in the last 72 hours. Urine analysis:    Component Value Date/Time   COLORURINE YELLOW 08/31/2009 2017   APPEARANCEUR CLEAR 08/31/2009 2017   LABSPEC 1.014 08/31/2009 2017   PHURINE 6.0 08/31/2009 2017   GLUCOSEU NEG mg/dL 16/10/960406/29/2011 54092017   HGBUR trace-intact 10/04/2008 0000   BILIRUBINUR negative 07/16/2011 1108   KETONESUR NEG mg/dL 81/19/147806/29/2011 29562017   PROTEINUR trace 07/16/2011 1108   PROTEINUR NEG mg/dL 21/30/865706/29/2011 84692017   UROBILINOGEN 0.2 07/16/2011 1108   UROBILINOGEN 0.2 08/31/2009 2017   NITRITE negative 07/16/2011 1108   NITRITE NEG 08/31/2009 2017   LEUKOCYTESUR Negative 07/16/2011 1108   Sepsis Labs: @LABRCNTIP (procalcitonin:4,lacticidven:4)  )No results found for this or any previous visit (from the past 240 hour(s)).       Radiology Studies: No results found.      Scheduled Meds: . bictegravir-emtricitabine-tenofovir AF  1 tablet Oral Daily  . cholecalciferol  1,000 Units Oral Once  . docusate sodium  100 mg Oral BID  . gemfibrozil  300 mg Oral BID AC  . hydrochlorothiazide  12.5 mg Oral Daily  . levothyroxine  25 mcg Oral QAC breakfast  . lisinopril  40 mg Oral Daily  . loratadine  10 mg Oral Daily  . metFORMIN  500 mg Oral BID WC  . multivitamin  1 tablet Oral Daily  . pravastatin  20 mg Oral QHS  . sodium chloride flush  3 mL Intravenous Q12H   Continuous Infusions: . sodium chloride    . pencillin G potassium IV 4 Million Units (03/22/17 1715)     LOS: 1 day    Time spent: 40 Minutes    Berton MountSylvester Jaire Pinkham, MD  [facility] Pager #: [phone] 7PM-7AM contact night coverage as above

## 2017-03-22 NOTE — Progress Notes (Signed)
PHARMACY CONSULT NOTE FOR:  OUTPATIENT  PARENTERAL ANTIBIOTIC THERAPY (OPAT)  Indication: Neurosyphilis  Regimen: Penicillin 24 million units/day continuous infusion  End date: 04/03/17  IV antibiotic discharge orders are pended. To discharging provider:  please sign these orders via discharge navigator,  Select New Orders & click on the button choice - Manage This Unsigned Work.     Thank you for allowing pharmacy to be a part of this patient's care.  Sharin MonsEmily Sinclair, PharmD, BCPS PGY2 Infectious Diseases Pharmacy Resident Pager: [phone]  03/22/2017, 12:06 PM

## 2017-03-22 NOTE — Progress Notes (Signed)
[facility] Note  03/21/2017     CHIEF COMPLAINT Patient presents for Retina Follow Up   HISTORY OF PRESENT ILLNESS: Jonathan Estes is a 62 y.o. male who presents to the clinic today for:   HPI    Retina Follow Up    Patient presents with  Other.  In right eye.  I, the attending physician,  performed the HPI with the patient and updated documentation appropriately.          Comments    Here for follow up discussion regarding labs and work up.       Last edited by Bernarda Caffey, MD on 03/22/2017  4:45 PM. 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 Denies change in vision since last visit.  Referring physician: Mellody Dance, DO [address] [place], [place] [address]  HISTORICAL INFORMATION:   Selected notes from the MEDICAL RECORD NUMBER Referred by Dr. Parthenia Ames for concern of optic neuritis, wet AMD, macular separation;  LEE- 01.15.19 (S.[name]) [BCVA OD: 20/100 OS: 20/30] Ocular Hx- glaucoma suspect, APD OD PMH- type II DM, elevated cholesterol, HTN, HIV positive    CURRENT MEDICATIONS: No current facility-administered medications for this visit.  (Ophthalmic Drugs)   No current outpatient medications on file. (Ophthalmic Drugs)   Facility-Administered Medications Ordered in Other Visits (Ophthalmic Drugs)  Medication Route  . naphazoline-glycerin (CLEAR EYES REDNESS) ophth solution 1 drop Both Eyes   No current facility-administered medications for this visit.  (Other)   Current Outpatient Medications (Other)  Medication Sig  . penicillin G IVPB Inject 24 Million Units into the vein daily for 12 days. By Continuous infusion Indication:  Neurosyphilis Last Day of Therapy:  04/03/17 Labs - Once weekly:  CBC/D and BMP, Labs - Every other week:  ESR and CRP   Facility-Administered Medications Ordered in Other Visits (Other)  Medication Route  . 0.9 %  sodium chloride  infusion Intravenous  . acetaminophen (TYLENOL) tablet 650 mg Oral   Or  . acetaminophen (TYLENOL) suppository 650 mg Rectal  . ALPRAZolam (XANAX) tablet 0.5 mg Oral  . bictegravir-emtricitabine-tenofovir AF (BIKTARVY) 50-200-25 MG per tablet 1 tablet Oral  . cholecalciferol (VITAMIN D) tablet 1,000 Units Oral  . docusate sodium (COLACE) capsule 100 mg Oral  . gemfibrozil (LOPID) tablet 300 mg Oral  . hydrochlorothiazide (MICROZIDE) capsule 12.5 mg Oral  . levothyroxine (SYNTHROID, LEVOTHROID) tablet 25 mcg Oral  . lisinopril (PRINIVIL,ZESTRIL) tablet 40 mg Oral  . loratadine (CLARITIN) tablet 10 mg Oral  . metFORMIN (GLUCOPHAGE) tablet 500 mg Oral  . multivitamin (PROSIGHT) tablet 1 tablet Oral  . ondansetron (ZOFRAN) tablet 4 mg Oral   Or  . ondansetron (ZOFRAN) injection 4 mg Intravenous  . penicillin G potassium 4 Million Units in dextrose 5 % 250 mL IVPB Intravenous  . pravastatin (PRAVACHOL) tablet 20 mg Oral  . sodium chloride flush (NS) 0.9 % injection 3 mL Intravenous  . sodium chloride flush (NS) 0.9 % injection 3 mL Intravenous  . zolpidem (AMBIEN) tablet 5 mg Oral      REVIEW OF SYSTEMS: ROS    Positive for: Skin, Eyes   Negative for: Constitutional, Gastrointestinal, Neurological, Genitourinary, Musculoskeletal, HENT, Endocrine, Cardiovascular, Respiratory, Psychiatric, Allergic/Imm, Heme/Lymph   Last edited by Bernarda Caffey, MD on 03/22/2017  4:45 PM. (History)       ALLERGIES Allergies  Allergen Reactions  . Sulfamethoxazole-Trimethoprim     REACTION: severe rash  PAST MEDICAL HISTORY Past Medical History:  Diagnosis Date  . AIDS (HCC) 07/12/2014  . Anxiety   . Arthritis    "hips, shoulders" (03/21/2017)  . Bilateral bunions    left foot worse  . Coronary artery disease involving native coronary artery 01/12/2015  . Diabetes mellitus type 2, controlled, without complications (HCC) 01/12/2015  . Diabetes type 2, controlled (HCC) 07/12/2014  . High  triglycerides   . HTN (hypertension)   . Human immunodeficiency virus (HIV) disease (HCC) d'x 09/2007  . Hyperlipidemia   . Hypothyroid   . 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 BRONCHOSCOPY  09/29/2007   Bronchoalveolar lavage was  obtained from the left lower lobe.  Under fluoroscopy, transbronchial /notes 07/04/2010  . CONDYLOMA EXCISION/FULGURATION  ~ 1996   anal/notes 07/04/2010    FAMILY HISTORY Family History  Problem Relation Age of Onset  . Cancer Father        Colon  . Colon cancer Father   . Leukemia Maternal Grandfather     SOCIAL HISTORY Social History   Tobacco Use  . Smoking status: Never Smoker  . Smokeless tobacco: Never Used  Substance Use Topics  . Alcohol use: Yes    Comment: 03/21/2017 "none since 01/12/2017"  . Drug use: Yes    Types: Marijuana, Cocaine    Comment: 03/21/2017 "no cocaine for a few years; I smoke marijuana qd"         OPHTHALMIC EXAM:  Base Eye Exam    Visual Acuity (Snellen - Linear)      Right Left   Dist [place] 20/50 20/20   Dist ph [place] 20/40 NI       Pupils      Dark Light Shape React APD   Right 4 3 Round Brisk +2   Left 4 3 Round Brisk None       Extraocular Movement      Right Left    Full, Ortho Full, Ortho       Neuro/Psych    Oriented x3:  Yes   Mood/Affect:  Normal        Slit Lamp and Fundus Exam    Slit Lamp Exam      Right Left   Lids/Lashes UL Telangiectasia, Meibomian gland dysfunction UL Telangiectasia, Meibomian gland dysfunction   Conjunctiva/Sclera White and quiet White and quiet   Cornea mild Arcus, otherwise clear mild Arcus, otherwise clear   Anterior Chamber deep, 1+ Cell deep, 1+ Cell   Iris Round and dilated to 6.5 mm Round and dilated to 6.5 mm   Lens 2+ Nuclear sclerosis, 2+ Cortical cataract 2+ Nuclear sclerosis, 2+ Cortical cataract   Vitreous Vitreous syneresis Vitreous syneresis          IMAGING AND PROCEDURES    Imaging and Procedures for 03/22/17  Comprehensive metabolic panel     Component Value Flag Ref Range Units Status   Sodium 136    135 - 145 mmol/L Final   Potassium 3.4 (Low)  L  3.5 - 5.1 mmol/L Final   Chloride 98 (Low)  L  101 - 111 mmol/L Final   CO2 25    22 - 32 mmol/L Final   Glucose, Bld 106 (High)  H  65 - 99 mg/dL Final   BUN 15    6 - 20 mg/dL Final   Creatinine, Ser 0.97    0.61 - 1.24 mg/dL Final   Calcium 9.8      8.9 - 10.3 mg/dL Final   Total Protein 8.3 (High)  H  6.5 - 8.1 g/dL Final   Albumin 4.3    3.5 - 5.0 g/dL Final   AST 21    15 - 41 U/L Final   ALT 16 (Low)  L  17 - 63 U/L Final   Alkaline Phosphatase 99    38 - 126 U/L Final   Total Bilirubin 0.7    0.3 - 1.2 mg/dL Final   GFR calc non Af Amer >60    >60 mL/min Final   GFR calc Af Amer >60    >60 mL/min Final   Comment:   (NOTE) The eGFR has been calculated using the CKD EPI equation. This calculation has not been validated in all clinical situations. eGFR's persistently <60 mL/min signify possible Chronic Kidney Disease.    Anion gap 13    5 - 15  Final        CBC WITH DIFFERENTIAL     Component Value Flag Ref Range Units Status   WBC 10.9 (High)  H  4.0 - 10.5 K/uL Final   RBC 4.55    4.22 - 5.81 MIL/uL Final   Hemoglobin 14.7    13.0 - 17.0 g/dL Final   HCT 42.7    39.0 - 52.0 % Final   MCV 93.8    78.0 - 100.0 fL Final   MCH 32.3    26.0 - 34.0 pg Final   MCHC 34.4    30.0 - 36.0 g/dL Final   RDW 13.2    11.5 - 15.5 % Final   Platelets 243    150 - 400 K/uL Final   Neutrophils Relative % 54     % Final   Neutro Abs 5.9    1.7 - 7.7 K/uL Final   Lymphocytes Relative 35     % Final   Lymphs Abs 3.8    0.7 - 4.0 K/uL Final   Monocytes Relative 8     % Final   Monocytes Absolute 0.9    0.1 - 1.0 K/uL Final   Eosinophils Relative 2     % Final   Eosinophils Absolute 0.2    0.0 - 0.7 K/uL Final   Basophils Relative 1     % Final   Basophils Absolute 0.1    0.0 - 0.1 K/uL Final         CMV IgM     Component Value Flag Ref Range Units Status   CMV IgM <30.0    0.0 - 29.9 AU/mL Final   Comment:   (NOTE)                                Negative         <30.0                                Equivocal  30.0 - 34.9                                Positive         >34.9 A positive result is generally indicative of acute infection, reactivation or persistent IgM production. Performed At: [facility] [address] [employer], [place] [id number] Nagendra Sanjai MD Ph:[phone]           Cmv antibody, IgG (EIA)     Component Value Flag Ref Range Units Status   CMV Ab - IgG >10.00 (High)  H  0.00 - 0.59 U/mL Final   Comment:   (NOTE)                               Negative          <0.60                               Equivocal   0.60 - 0.69                               Positive          >0.69 Performed At: [facility] [place], [place] [id number] Rush Farmer MD VO:[phone]         C-reactive protein     Component Value Flag Ref Range Units Status   CRP 4.2    <8.0 mg/L Final        RPR     Component Value Flag Ref Range Units Status   RPR Ser Ql REACTIVE (Abnormal)  A  NON-REACTI  Final        Fluorescent treponemal ab(fta)-IgG-bld     Component Value Flag Ref Range Units Status   Fluorescent Treponemal ABS REACTIVE (Abnormal)  A  NON-REACTI  Final        Sedimentation rate     Component Value Flag Ref Range Units Status   Sed Rate 34 (High)  H  0 - 20 mm/h Final        Comp Met (CMET)     Component Value Flag Ref Range Units Status   Glucose, Bld 113 (High)  H  65 - 99 mg/dL Final   Comment:   .            Fasting reference interval . For someone without known diabetes, a glucose value between 100 and 125 mg/dL is consistent with prediabetes and should be confirmed with a follow-up test. .    BUN 16    7 - 25 mg/dL Final   Creat 0.99    0.70 - 1.33 mg/dL Final   Comment:   For patients >7 years of age,  the reference limit for Creatinine is approximately 13% higher for people identified as African-American. .    BUN/Creatinine Ratio NOT APPLICABLE    6 - 22 (calc) Final   Sodium 137    135 - 146 mmol/L Final   Potassium 4.1    3.5 - 5.3 mmol/L Final   Chloride 99    98 - 110 mmol/L Final   CO2 26    20 - 32 mmol/L Final   Calcium 10.0    8.6 - 10.3 mg/dL Final   Total Protein 8.2 (High)  H  6.1 - 8.1 g/dL Final   Albumin 4.9    3.6 - 5.1 g/dL Final   Globulin 3.3    1.9 - 3.7 g/dL (calc) Final   AG Ratio 1.5    1.0 - 2.5 (calc) Final   Total Bilirubin 0.4    0.2 - 1.2 mg/dL Final   Alkaline phosphatase (APISO) 109    40 - 115 U/L Final   AST 18    10 - 35 U/L  Final   ALT 16    9 - 46 U/L Final        CBC     Component Value Flag Ref Range Units Status   WBC 13.4 (High)  H  3.8 - 10.8 Thousand/uL Final   RBC 4.86    4.20 - 5.80 Million/uL Final   Hemoglobin 15.6    13.2 - 17.1 g/dL Final   HCT 43.9    38.5 - 50.0 % Final   MCV 90.3    80.0 - 100.0 fL Final   MCH 32.1    27.0 - 33.0 pg Final   MCHC 35.5    32.0 - 36.0 g/dL Final   RDW 13.0    11.0 - 15.0 % Final   Platelets 295    140 - 400 Thousand/uL Final   MPV 9.5    7.5 - 12.5 fL Final        T-helper cell (CD4)- ([facility] clinic only)     Component Value Flag Ref Range Units Status   CD4 T Cell Abs 440    400 - 2,700 /uL Final   CD4 % Helper T Cell 14 (Low)  L  33 - 55 % Final   Comment:   Performed at [facility], 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 HIV (human immunodeficiency virus infection) (HCC) B20   4. 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 Moderate nonproliferative diabetic retinopathy of both eyes without macular edema associated with type 2 diabetes mellitus (HCC) E11.3393   6.  Retinal edema H35.81   7. Combined forms of age-related cataract of both eyes H25.813     1,2. 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 Cynthia Snider - given high suspicion for ocular syphilis, recommend immediate transfer to ID clinic for labs and likely admission for IV PenG. 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 HIV infection - diagnosed 20+ years ago - contracted through unprotected sex with women and men - has been on antiretrovirals for ~10 yrs - CD4 count >600, 10/2017 - HIV RNA undetectable, 10/2017  4. Optic neuropathy OD - 2+ rAPD OD - FA: + hyperfluorescence of disc consistent with inflammation - retinal atrophy surrounding disc  5. Moderate   nonproliferative diabetic retinopathy w/o DME, both eyes - The incidence, risk factors for progression, natural history and treatment options for diabetic retinopathy were discussed with patient.   - The need for close monitoring of blood glucose, blood pressure, and serum lipids, avoiding cigarette or any type of tobacco,  and the need for long term follow up was also discussed with patient. - exam with mild scattered MAs - FA with scattered MAs, no NV - OCT without diabetic macular edema, both eyes  - f/u in 2-3 mos  6. No retinal edema on exam or OCT  7. Combined forms of age-related cataract OU-  - The symptoms of cataract, surgical options, and treatments and risks were discussed with patient. - discussed diagnosis and progression - not yet visually significant - monitor for now   Ophthalmic Meds Ordered this visit:  No orders of the defined types were placed in this encounter.      No Follow-up on file.  There are no Patient Instructions on file for this visit.   Explained the diagnoses, plan, and follow up with the patient and they expressed understanding.  Patient expressed understanding of the importance of proper follow up care.    Gardiner Sleeper, M.D., Ph.D. Diseases & Surgery of the Retina and [place] 03/22/17    Abbreviations: M myopia (nearsighted); A astigmatism; H hyperopia (farsighted); P presbyopia; Mrx spectacle prescription;  CTL contact lenses; OD right eye; OS left eye; OU both eyes  XT exotropia; ET esotropia; PEK punctate epithelial keratitis; PEE punctate epithelial erosions; DES dry eye syndrome; MGD meibomian gland dysfunction; ATs artificial tears; PFAT's preservative free artificial tears; Pump Back nuclear sclerotic cataract; PSC posterior subcapsular cataract; ERM epi-retinal membrane; PVD posterior vitreous detachment; RD retinal detachment; DM diabetes mellitus; DR diabetic retinopathy; NPDR non-proliferative diabetic retinopathy; PDR proliferative diabetic retinopathy; CSME clinically significant macular edema; DME diabetic macular edema; dbh dot blot hemorrhages; CWS cotton wool spot; POAG primary open angle glaucoma; C/D cup-to-disc ratio; HVF humphrey visual field; GVF goldmann visual field; OCT optical coherence tomography; IOP  intraocular pressure; BRVO Branch retinal vein occlusion; CRVO central retinal vein occlusion; CRAO central retinal artery occlusion; BRAO branch retinal artery occlusion; RT retinal tear; SB scleral buckle; PPV pars plana vitrectomy; VH Vitreous hemorrhage; PRP panretinal laser photocoagulation; IVK intravitreal kenalog; VMT vitreomacular traction; MH Macular hole;  NVD neovascularization of the disc; NVE neovascularization elsewhere; AREDS age related eye disease study; ARMD age related macular degeneration; POAG primary open angle glaucoma; EBMD epithelial/anterior basement membrane dystrophy; ACIOL anterior chamber intraocular lens; IOL intraocular lens; PCIOL posterior chamber intraocular lens; Phaco/IOL phacoemulsification with intraocular lens placement; [place] photorefractive keratectomy; LASIK laser assisted in situ keratomileusis; HTN hypertension; DM diabetes mellitus; COPD chronic obstructive pulmonary disease

## 2017-03-22 NOTE — Care Management Note (Addendum)
Case Management Note  Patient Details  Name: Jonathan Estes MRN: [id number] Date of Birth: [birth year]  Subjective/Objective:    Pt admitted with blurred visio                  Action/Plan:  PTA independent from home alone.  Pt will need IV antibiotics at discharge- [facility] and are following for possible weekend discharge.  CM requested orders    Expected Discharge Date:                  Expected Discharge Plan:  Home w Home Health Services  In-House Referral:     Discharge planning Services  CM Consult  Post Acute Care Choice:    Choice offered to:  Patient  DME Arranged:  IV pump/equipment DME Agency:  [facility]  [facility]  Status of Service:  In process, will continue to follow  If discussed at Long Length of Stay Meetings, dates discussed:    Additional Comments:  Cherylann ParrClaxton, Sheketa Ende S, RN 03/22/2017, 4:41 PM

## 2017-03-23 DIAGNOSIS — H209 Unspecified iridocyclitis: Secondary | ICD-10-CM | POA: Diagnosis not present

## 2017-03-23 LAB — HIV-1 RNA QUANT-NO REFLEX-BLD
HIV 1 RNA Quant: 26 copies/mL — ABNORMAL HIGH
HIV-1 RNA Quant, Log: 1.41 Log copies/mL — ABNORMAL HIGH

## 2017-03-23 LAB — QUANTIFERON-TB GOLD PLUS
Mitogen-NIL: >10 IU/mL
NIL: 0.05 [IU]/mL
QUANTIFERON-TB GOLD PLUS: NEGATIVE
TB1-NIL: <0 IU/mL

## 2017-03-23 MED ORDER — SODIUM CHLORIDE 0.9% FLUSH: 10.0000 mL | INTRAVENOUS | Status: DC | PRN

## 2017-03-23 MED ORDER — HEPARIN SOD (PORK) LOCK FLUSH 100 UNIT/ML IV SOLN
250.0000 [IU] | INTRAVENOUS | Status: DC | PRN
  Administered 2017-03-23: 250 [IU]
  Filled 2017-03-23 (×2): qty 3

## 2017-03-23 MED ORDER — HEPARIN SOD (PORK) LOCK FLUSH 100 UNIT/ML IV SOLN
250.0000 [IU] | Freq: Every day | INTRAVENOUS | Status: DC
  Filled 2017-03-23: qty 2.5

## 2017-03-23 NOTE — Discharge Summary (Signed)
Physician Discharge Summary  Patient ID: Jonathan Estes MRN: [id number] DOB/AGE: [birth year] 60 y.o.  Admit date: 03/21/2017 Discharge date: 03/23/2017  Admission Diagnoses:  Discharge Diagnoses:  Principal Problem:   Syphilis Uveitis   Neuro Syphilis   Change in vision Active Problems:   Human immunodeficiency virus (HIV) disease ([place])   Controlled type 2 diabetes mellitus without complication, without long-term current use of insulin (HCC)   DEPRESSION/ANXIETY   Anxiety   HTN (hypertension)   Dyslipidemia with low high density lipoprotein (HDL) cholesterol with hypertriglyceridemia due to type 2 diabetes mellitus (HCC)   Environmental and seasonal allergies   Diabetic nephropathy associated with type 2 diabetes mellitus: Microalbuminuria   Vitamin D deficiency   Uveitis   Discharged Condition: stable  Hospital Course:  Patient is a 62 year oldmalewith Past Medical History significant for AIDS with h/o PJP; hypothyroidism; HLD; HTN; DM; and CAD. Patient presented with right eye visual loss. Apparently, patient went to his Ophthalmologist and there were concerns for possible Neuro syphilis. Patient was advised to come to the hospital for further assessment and management. Work up done revealed positive RPR. Patient was started on IV Penicillin G for Syphilis uveitis. Infectious Disease team directed patient's antibiotics management . Patient will be discharged on IV Penicillin G to complete the course of antibiotics. Patient's vision is improving with the IV antibiotics.    Consults: ID  Significant Diagnostic Studies: labs: RPR came back positive  Treatments: See above  Discharge Exam: Blood pressure 118/74, pulse 77, temperature 98.4 F (36.9 C), resp. rate 17, height '5\' 9"'  (1.753 m), weight 86.2 kg (190 lb), SpO2 100 %.  Disposition:   Discharge Instructions    Call MD for:   Complete by:  As directed    Call MD for worsening of symptoms   Diet - low sodium heart  healthy   Complete by:  As directed    Consistent carbohydrate diet   Home infusion instructions [facility] May follow [place] Dosing Protocol; May administer Cathflo as needed to maintain patency of vascular access device.; Flushing of vascular access device: per [facility] Protocol: 0.9% NaCl pre/post medica...   Complete by:  As directed    Instructions:  May follow [place] Dosing Protocol   Instructions:  May administer Cathflo as needed to maintain patency of vascular access device.   Instructions:  Flushing of vascular access device: per [facility] Protocol: 0.9% NaCl pre/post medication administration and prn patency; Heparin 100 u/ml, 57m for implanted ports and Heparin 10u/ml, 540mfor all other central venous catheters.   Instructions:  May follow [facility] Anaphylaxis Protocol for First Dose Administration in the home: 0.9% NaCl at 25-50 ml/hr to maintain IV access for protocol meds. Epinephrine 0.3 ml IV/IM PRN and Benadryl 25-50 IV/IM PRN s/s of anaphylaxis.   Instructions:  [facility] (RN) to assist per patient IV care needs in the home PRN.   Increase activity slowly   Complete by:  As directed      Allergies as of 03/23/2017      Reactions   Sulfamethoxazole-trimethoprim    REACTION: severe rash      Medication List    STOP taking these medications   OSTEO BI-FLEX ADV DOUBLE ST PO     TAKE these medications   ALPRAZolam 0.5 MG tablet Commonly known as:  XANAX TAKE 1 TABLET BY MOUTH 3 TIMES A DAY AS NEEDED FOR ANXIETY   bictegravir-emtricitabine-tenofovir AF 50-200-25 MG Tabs tablet Commonly known  as:  BIKTARVY Take 1 tablet by mouth daily.   cetirizine 10 MG tablet Commonly known as:  ZYRTEC TAKE 1 TABLET (10 MG TOTAL) BY MOUTH DAILY.   [employer] LANCETS ORIGINAL Misc 1 application by Does not apply route daily.   gemfibrozil 600 MG tablet Commonly known as:  LOPID 1/2 tab twice daily   glucose blood test strip Commonly known as:   ACCU-CHEK AVIVA Use as instructed   hydrochlorothiazide 12.5 MG capsule Commonly known as:  MICROZIDE TAKE 1 CAPSULE BY MOUTH EVERY DAY   ICAPS AREDS 2 PO Take by mouth. What changed:  Another medication with the same name was removed. Continue taking this medication, and follow the directions you see here.   levothyroxine 25 MCG tablet Commonly known as:  SYNTHROID, LEVOTHROID TAKE 1 TABLET BY MOUTH EVERY DAY ON AN EMPTY STOMACH   lisinopril 40 MG tablet Commonly known as:  PRINIVIL,ZESTRIL TAKE 1 TABLET BY MOUTH EVERY DAY   metFORMIN 500 MG tablet Commonly known as:  GLUCOPHAGE Take 1 tablet (500 mg total) by mouth 2 (two) times daily with a meal.   penicillin G IVPB Inject 24 Million Units into the vein daily for 12 days. By Continuous infusion Indication:  Neurosyphilis Last Day of Therapy:  04/03/17 Labs - Once weekly:  CBC/D and BMP, Labs - Every other week:  ESR and CRP   pravastatin 20 MG tablet Commonly known as:  PRAVACHOL Take 1 tablet (20 mg total) by mouth at bedtime.   tetrahydrozoline 0.05 % ophthalmic solution Place 1 drop into both eyes as needed (dry eyes).   Vitamin D3 5000 units Caps Take 1 capsule by mouth.   zinc gluconate 50 MG tablet Take 50 mg by mouth daily.            Home Infusion Instuctions  (From admission, onward)        Start     Ordered   03/22/17 0000  Home infusion instructions [facility] May follow [place] Dosing Protocol; May administer Cathflo as needed to maintain patency of vascular access device.; Flushing of vascular access device: per [facility] Protocol: 0.9% NaCl pre/post medica...    Question Answer Comment  Instructions May follow [place] Dosing Protocol   Instructions May administer Cathflo as needed to maintain patency of vascular access device.   Instructions Flushing of vascular access device: per [facility] Protocol: 0.9% NaCl pre/post medication administration and prn patency; Heparin 100 u/ml, 63m for  implanted ports and Heparin 10u/ml, 557mfor all other central venous catheters.   Instructions May follow [facility] Anaphylaxis Protocol for First Dose Administration in the home: 0.9% NaCl at 25-50 ml/hr to maintain IV access for protocol meds. Epinephrine 0.3 ml IV/IM PRN and Benadryl 25-50 IV/IM PRN s/s of anaphylaxis.   Instructions [facility] Infusion Coordinator (RN) to assist per patient IV care needs in the home PRN.      03/22/17 1210     Follow-up Information    Adefeso, Oladapo, DO Follow up.   Specialty:  Internal Medicine Contact information: [phone]         Signed: SyBonnell Public/19/2019, 11:48 AM

## 2017-03-23 NOTE — Progress Notes (Signed)
I went over discharge instructions with the patient. IV removed and patient discharged with PICC to home. IV team flushed and treated PICC line. Patient verbally states he will be home by 2pm [facility].

## 2017-03-23 NOTE — Care Management (Signed)
[facility] notified that pt is discharged and will be home by 2-4pm for first RN visit, as documented by Pam.

## 2017-03-23 NOTE — Progress Notes (Signed)
Peripherally Inserted Central Catheter/Midline Placement  The IV Nurse has discussed with the patient and/or persons authorized to consent for the patient, the purpose of this procedure and the potential benefits and risks involved with this procedure.  The benefits include less needle sticks, lab draws from the catheter, and the patient may be discharged home with the catheter. Risks include, but not limited to, infection, bleeding, blood clot (thrombus formation), and puncture of an artery; nerve damage and irregular heartbeat and possibility to perform a PICC exchange if needed/ordered by physician.  Alternatives to this procedure were also discussed.  Bard Power PICC patient education guide, fact sheet on infection prevention and patient information card has been provided to patient /or left at bedside.    PICC/Midline Placement Documentation  PICC Single Lumen 03/23/17 PICC Right Brachial 40 cm 1 cm (Active)  Indication for Insertion or Continuance of Line Home intravenous therapies (PICC only) 03/23/2017  8:00 AM  Exposed Catheter (cm) 1 cm 03/23/2017  8:00 AM  Site Assessment Clean;Dry;Intact 03/23/2017  8:00 AM  Line Status Flushed;Saline locked;Capped (central line);Blood return noted 03/23/2017  8:00 AM  Dressing Type Transparent 03/23/2017  8:00 AM  Dressing Status Clean;Dry;Intact;Antimicrobial disc in place 03/23/2017  8:00 AM  Line Care Connections checked and tightened 03/23/2017  8:00 AM  Line Adjustment (NICU/IV Team Only) No 03/23/2017  8:00 AM  Dressing Intervention New dressing 03/23/2017  8:00 AM  Dressing Change Due 03/30/17 03/23/2017  8:00 AM       Elliot Dallyiggs, Gavyn Ybarra Wright 03/23/2017, 9:08 AM

## 2017-03-23 NOTE — Plan of Care (Signed)
  Health Behavior/Discharge Planning: Ability to manage health-related needs will improve 03/23/2017 1125 - Completed/Met by Ames Dura, RN   Clinical Measurements: Ability to maintain clinical measurements within normal limits will improve 03/23/2017 1125 - Completed/Met by Ames Dura, RN Will remain free from infection 03/23/2017 1125 - Completed/Met by Ames Dura, RN Diagnostic test results will improve 03/23/2017 1125 - Completed/Met by Ames Dura, RN Respiratory complications will improve 03/23/2017 1125 - Completed/Met by Ames Dura, RN Cardiovascular complication will be avoided 03/23/2017 1125 - Completed/Met by Ames Dura, RN   Activity: Risk for activity intolerance will decrease 03/23/2017 1125 - Completed/Met by Ames Dura, RN   Nutrition: Adequate nutrition will be maintained 03/23/2017 1125 - Completed/Met by Ames Dura, RN   Coping: Level of anxiety will decrease 03/23/2017 1125 - Completed/Met by Julieanne Cotton A, RN   Elimination: Will not experience complications related to bowel motility 03/23/2017 1125 - Completed/Met by Ames Dura, RN Will not experience complications related to urinary retention 03/23/2017 1125 - Completed/Met by Ames Dura, RN   Pain Managment: General experience of comfort will improve 03/23/2017 1125 - Completed/Met by Ames Dura, RN   Safety: Ability to remain free from injury will improve 03/23/2017 1125 - Completed/Met by Ames Dura, RN   Skin Integrity: Risk for impaired skin integrity will decrease 03/23/2017 1125 - Completed/Met by Ames Dura, RN

## 2017-03-24 DIAGNOSIS — H209 Unspecified iridocyclitis: Secondary | ICD-10-CM | POA: Diagnosis not present

## 2017-03-25 DIAGNOSIS — Z792 Long term (current) use of antibiotics: Secondary | ICD-10-CM | POA: Diagnosis not present

## 2017-03-25 DIAGNOSIS — H209 Unspecified iridocyclitis: Secondary | ICD-10-CM | POA: Diagnosis not present

## 2017-03-25 LAB — CBC
HCT: 43.9 % (ref 38.5–50.0)
Hemoglobin: 15.6 g/dL (ref 13.2–17.1)
MCH: 32.1 pg (ref 27.0–33.0)
MCHC: 35.5 g/dL (ref 32.0–36.0)
MCV: 90.3 fL (ref 80.0–100.0)
MPV: 9.5 fL (ref 7.5–12.5)
Platelets: 295 10*3/uL (ref 140–400)
RBC: 4.86 10*6/uL (ref 4.20–5.80)
RDW: 13 % (ref 11.0–15.0)
WBC: 13.4 10*3/uL — ABNORMAL HIGH (ref 3.8–10.8)

## 2017-03-25 LAB — COMPREHENSIVE METABOLIC PANEL
AG RATIO: 1.5 (calc) (ref 1.0–2.5)
ALBUMIN MSPROF: 4.9 g/dL (ref 3.6–5.1)
ALT: 16 U/L (ref 9–46)
AST: 18 U/L (ref 10–35)
Alkaline phosphatase (APISO): 109 U/L (ref 40–115)
BILIRUBIN TOTAL: 0.4 mg/dL (ref 0.2–1.2)
BUN: 16 mg/dL (ref 7–25)
CHLORIDE: 99 mmol/L (ref 98–110)
CO2: 26 mmol/L (ref 20–32)
Calcium: 10 mg/dL (ref 8.6–10.3)
Creat: 0.99 mg/dL (ref 0.70–1.33)
GLOBULIN: 3.3 g/dL (ref 1.9–3.7)
GLUCOSE: 113 mg/dL — AB (ref 65–99)
POTASSIUM: 4.1 mmol/L (ref 3.5–5.3)
SODIUM: 137 mmol/L (ref 135–146)
TOTAL PROTEIN: 8.2 g/dL — AB (ref 6.1–8.1)

## 2017-03-25 LAB — SEDIMENTATION RATE: Sed Rate: 34 mm/h — ABNORMAL HIGH (ref 0–20)

## 2017-03-25 LAB — C-REACTIVE PROTEIN: CRP: 4.2 mg/L (ref <8.0)

## 2017-03-25 LAB — RPR: RPR: REACTIVE — AB

## 2017-03-25 LAB — LYSOZYME, SERUM: Lysozyme, Serum: 8.5 ug/mL (ref 5.0–11.0)

## 2017-03-25 LAB — ANGIOTENSIN CONVERTING ENZYME: Angiotensin-Converting Enzyme: 5 U/L — ABNORMAL LOW (ref 9–67)

## 2017-03-25 LAB — VDRL

## 2017-03-25 LAB — FLUORESCENT TREPONEMAL AB(FTA)-IGG-BLD: FLUORESCENT TREPONEMAL ABS: REACTIVE — AB

## 2017-03-25 LAB — RPR TITER: RPR Titer: 1:64 {titer} — ABNORMAL HIGH

## 2017-03-26 ENCOUNTER — Telehealth: Payer: Self-pay | Admitting: *Deleted

## 2017-03-26 DIAGNOSIS — E119 Type 2 diabetes mellitus without complications: Secondary | ICD-10-CM | POA: Diagnosis not present

## 2017-03-26 DIAGNOSIS — I10 Essential (primary) hypertension: Secondary | ICD-10-CM | POA: Diagnosis not present

## 2017-03-26 DIAGNOSIS — I2510 Atherosclerotic heart disease of native coronary artery without angina pectoris: Secondary | ICD-10-CM | POA: Diagnosis not present

## 2017-03-26 DIAGNOSIS — H209 Unspecified iridocyclitis: Secondary | ICD-10-CM | POA: Diagnosis not present

## 2017-03-26 DIAGNOSIS — Z7984 Long term (current) use of oral hypoglycemic drugs: Secondary | ICD-10-CM | POA: Diagnosis not present
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
## 2017-03-26 NOTE — Telephone Encounter (Signed)
Can this be changed to a 24 hr infusion as was intended?  Do we need to go to Ceftriaxone 2 grams once daily instead to finish therapy

## 2017-03-27 NOTE — Progress Notes (Signed)
[facility] Note  03/28/2017     CHIEF COMPLAINT Patient presents for Retina Follow Up   HISTORY OF PRESENT ILLNESS: Jonathan Estes is a 62 y.o. male who presents to the clinic today for:   HPI    Retina Follow Up    Patient presents with  Other.  In right eye.  This started 1 week ago.  Severity is moderate.  Since onset it is gradually improving.  I, the attending physician,  performed the HPI with the patient and updated documentation appropriately.          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 Denies change in vision since last visit.  Referring physician:  Mellody Dance, DO [address] [place], [place] [address]  HISTORICAL INFORMATION:   Selected notes from the MEDICAL RECORD NUMBER Referred by Dr. Parthenia Ames for concern of optic neuritis, wet AMD, macular separation;  LEE- 01.15.19 (S.[name]) [BCVA OD: 20/100 OS: 20/30] Ocular Hx- glaucoma suspect, APD OD PMH- type II DM, elevated cholesterol, HTN, HIV positive    CURRENT MEDICATIONS: Current Outpatient Medications (Ophthalmic Drugs)  Medication Sig  . tetrahydrozoline 0.05 % ophthalmic solution Place 1 drop into both eyes as needed (dry eyes).   No current facility-administered medications for this visit.  (Ophthalmic Drugs)   Current Outpatient Medications (Other)  Medication Sig  . ALPRAZolam (XANAX) 0.5 MG tablet TAKE 1 TABLET BY MOUTH 3 TIMES A DAY AS NEEDED FOR ANXIETY  . bictegravir-emtricitabine-tenofovir AF (BIKTARVY) 50-200-25 MG TABS tablet Take 1 tablet by mouth daily.  . cetirizine  (ZYRTEC) 10 MG tablet TAKE 1 TABLET (10 MG TOTAL) BY MOUTH DAILY.  Marland Kitchen Cholecalciferol (VITAMIN D3) 5000 units CAPS Take 1 capsule by mouth.  . [facility] LANCETS ORIGINAL MISC 1 application by Does not apply route daily.  Marland Kitchen gemfibrozil (LOPID) 600 MG tablet 1/2 tab twice daily  . glucose blood (ACCU-CHEK AVIVA) test strip Use as instructed  . hydrochlorothiazide (MICROZIDE) 12.5 MG capsule TAKE 1 CAPSULE BY MOUTH EVERY DAY  . levothyroxine (SYNTHROID, LEVOTHROID) 25 MCG tablet TAKE 1 TABLET BY MOUTH EVERY DAY ON AN EMPTY STOMACH  . lisinopril (PRINIVIL,ZESTRIL) 40 MG tablet TAKE 1 TABLET BY MOUTH EVERY DAY  . metFORMIN (GLUCOPHAGE) 500 MG tablet Take 1 tablet (500 mg total) by mouth 2 (two) times daily with a meal.  . Multiple Vitamins-Minerals (ICAPS AREDS 2 PO) Take by mouth.  . penicillin G IVPB Inject 24 Million Units into the vein daily for 12 days. By Continuous infusion Indication:  Neurosyphilis Last Day of Therapy:  04/03/17 Labs - Once weekly:  CBC/D and BMP, Labs - Every other week:  ESR and CRP  . pravastatin (PRAVACHOL) 20 MG tablet Take 1 tablet (20 mg total) by mouth at bedtime.  Marland Kitchen zinc gluconate 50 MG tablet Take 50 mg by mouth daily.   No current facility-administered medications for this visit.  (Other)      REVIEW OF SYSTEMS: ROS    Positive for: Endocrine, Cardiovascular, Eyes, Psychiatric, Allergic/Imm   Negative for:  Constitutional, Gastrointestinal, Neurological, Skin, Genitourinary, Musculoskeletal, HENT, Respiratory, Heme/Lymph   Last edited by Debbrah Alar, COT on 03/28/2017  8:53 AM. (History)       ALLERGIES Allergies  Allergen Reactions  . Sulfamethoxazole-Trimethoprim     REACTION: severe rash    PAST MEDICAL HISTORY Past Medical History:  Diagnosis Date  . AIDS ([place]) 07/12/2014  . Anxiety   . Arthritis    "hips, shoulders" (03/21/2017)  . Bilateral bunions    left foot worse  . Coronary artery disease involving native coronary artery 01/12/2015  .  Diabetes mellitus type 2, controlled, without complications ([place]) 76/03/9507  . Diabetes type 2, controlled ([place]) 07/12/2014  . High triglycerides   . HTN (hypertension)   . Human immunodeficiency virus (HIV) disease ([place]) d'x 09/2007  . Hyperlipidemia   . Hypothyroid   . 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 BRONCHOSCOPY  09/29/2007   Bronchoalveolar lavage was  obtained from the left lower lobe.  Under fluoroscopy, transbronchial Archie Endo 07/04/2010  . CONDYLOMA EXCISION/FULGURATION  ~ 1996   anal/notes 07/04/2010    FAMILY HISTORY Family History  Problem Relation Age of Onset  . Cancer Father        Colon  . Colon cancer Father   . Leukemia Maternal Grandfather     SOCIAL HISTORY Social History   Tobacco Use  . Smoking status: Never Smoker  . Smokeless tobacco: Never Used  Substance Use Topics  . Alcohol use: Yes    Comment: 03/21/2017 "none since 01/12/2017"  . Drug use: Yes    Types: Marijuana, Cocaine    Comment: 03/21/2017 "no cocaine for a few years; I smoke marijuana qd"         OPHTHALMIC EXAM:  Base Eye Exam    Visual Acuity (Snellen - Linear)      Right Left   Dist [place] 20/25 -2 20/20 -1   Dist ph [place] 20/25 NI       Tonometry (Tonopen, 9:04 AM)      Right Left   Pressure 14 14       Pupils      Dark Light Shape React APD   Right 4 3 Round Brisk +2   Left 4 3 Round Brisk None       Visual Fields (Counting fingers)      Left Right    Full Full       Extraocular Movement      Right Left    Full, Ortho Full, Ortho       Neuro/Psych    Oriented x3:  Yes   Mood/Affect:  Normal       Dilation    Both eyes:  1.0% Mydriacyl, 2.5% Phenylephrine @ 9:04 AM        Slit Lamp and Fundus Exam    Slit Lamp Exam      Right Left   Lids/Lashes UL Telangiectasia, Meibomian gland dysfunction UL Telangiectasia, Meibomian gland dysfunction   Conjunctiva/Sclera White and quiet White and quiet    Cornea mild Arcus, otherwise clear mild Arcus, otherwise clear   Anterior Chamber deep, 1+ Cell/pigment deep, 1+ Cell   Iris Round and dilated to 6.5 mm Round and dilated to 6.5 mm   Lens 2+ Nuclear sclerosis, 2+ Cortical cataract 2+ Nuclear sclerosis, 2+ Cortical cataract   Vitreous Vitreous syneresis Vitreous syneresis       Fundus Exam      Right Left  Disc pink, sharp pink, sharp   C/D Ratio 0.2 0.2   Macula Blunted foveal reflex, Retinal pigment epithelial atrophy and mottleing - imporved, No heme or edema Good foveal reflex, mild Retinal pigment epithelial mottling   Vessels Mild Copper wiring Mild Copper wiring   Periphery Attached; subtle RPE atrophy from macula extending nasal to disc--improving; scattered MAs Attached; few MAs          IMAGING AND PROCEDURES  Imaging and Procedures for 03/28/17  OCT, Retina - OU - Both Eyes     Right Eye Quality was good. Central Foveal Thickness: 236. Progression has improved. Findings include normal foveal contour, no IRF, no SRF (Patchy ellipsoid disruption improved from prior).   Left Eye Quality was good. Central Foveal Thickness: 252. Progression has been stable. Findings include normal foveal contour, no IRF, no SRF.   Notes *Images captured and stored on drive  Diagnosis / Impression:  OD: NFP, no IRF/SRF; outer retinal atrophy and ellipsoid disruption improving OS: NFP; no IRF/SRF  Clinical management:  See below  Abbreviations: NFP - Normal foveal profile. CME - cystoid macular edema. PED - pigment epithelial detachment. IRF - intraretinal fluid. SRF - subretinal fluid. EZ - ellipsoid zone. ERM - epiretinal membrane. ORA - outer retinal atrophy. ORT - outer retinal tubulation. 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 HIV (human immunodeficiency  virus infection) ([place]) B20   5. 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 Moderate nonproliferative diabetic retinopathy of both eyes without macular edema associated with type 2 diabetes mellitus ([place]) H85.2778   7. Retinal edema H35.81 OCT, Retina - OU - Both Eyes  8. Combined forms of age-related cataract of both eyes H25.813     1,2,3. Ocular syphilis - Chorioretinitis, posterior uveitis OD  - pt reports onset of symptoms (decreased vision) upon waking on Saturday 03/16/17 - presented on 03/20/17 with VA decreased to 20/50 and focal area of RPE and outer retinal atrophy from fovea extending nasally across disc - OCT and fundus autofluorescence with focal outer retinal atrophy corresponding to exam - FA window defect in area of atrophy + late leakage temporal to fovea and hyperfluorescence of the disc - case discussed with Infectious Disease call physician, Dr. Carlyle Basques - labs positive for RPR, VDRL, and FTA-Ab - acute syphilitic posterior placoid chorioretinopathy (ASPPC) - pt reports trunk, palmar and plantar rashes in July 2018, but had a negative RPR - possible false negative in setting of HIV - was admitted to [facility] and started IV PCN on 03/21/17 - discharged on 03/23/17 with PICC line for continued IV PCN at home -- tolerating well - today, VA improved and OCT shows reconstitution of ellipsoid zone -- excellent response to IV PCN therapy  - mild AC cell -- will start PF OU QID OD - F/U 1 week at 10:00 AM  4. HIV infection - diagnosed 20+ years ago - contracted through unprotected sex with women and men - has been on antiretrovirals for ~10 yrs - CD4 count >600, 10/2017 - HIV RNA undetectable, 10/2017 - management per ID service  5. Optic neuropathy OD - 2+ rAPD OD - FA: + hyperfluorescence of disc consistent with inflammation - retinal atrophy surrounding disc  6. Moderate nonproliferative diabetic  retinopathy w/o DME, both eyes - The incidence, risk factors for  progression, natural history and treatment options for diabetic retinopathy were discussed with patient.   - The need for close monitoring of blood glucose, blood pressure, and serum lipids, avoiding cigarette or any type of tobacco, and the need for long term follow up was also discussed with patient. - exam with mild scattered MAs - FA with scattered MAs, no NV - OCT without diabetic macular edema, both eyes  - f/u in 2-3 mos  7. No retinal edema on exam or OCT  8. Combined forms of age-related cataract OU-  - The symptoms of cataract, surgical options, and treatments and risks were discussed with patient. - discussed diagnosis and progression - not yet visually significant - monitor for now   Ophthalmic Meds Ordered this visit:  No orders of the defined types were placed in this encounter.      Return in about 1 week (around 04/04/2017) for F/U Ocular syphilis OD.  There are no Patient Instructions on file for this visit.   Explained the diagnoses, plan, and follow up with the patient and they expressed understanding.  Patient expressed understanding of the importance of proper follow up care.   This document serves as a record of services personally performed by Gardiner Sleeper, MD, PhD. It was created on their behalf by Catha Brow, [place], a certified ophthalmic assistant. The creation of this record is the provider's dictation and/or activities during the visit.  Electronically signed by: Catha Brow, Elias-Fela Solis  03/28/17 9:58 AM    Gardiner Sleeper, M.D., Ph.D. Diseases & Surgery of the Retina and [place] 03/28/17  I have reviewed the above documentation for accuracy and completeness, and I agree with the above. Gardiner Sleeper, M.D., Ph.D. 03/28/17 9:58 AM    Abbreviations: M myopia (nearsighted); A astigmatism; H hyperopia (farsighted); P presbyopia; Mrx spectacle prescription;  CTL contact lenses; OD right eye; OS left eye; OU  both eyes  XT exotropia; ET esotropia; PEK punctate epithelial keratitis; PEE punctate epithelial erosions; DES dry eye syndrome; MGD meibomian gland dysfunction; ATs artificial tears; PFAT's preservative free artificial tears; [place] nuclear sclerotic cataract; PSC posterior subcapsular cataract; ERM epi-retinal membrane; PVD posterior vitreous detachment; RD retinal detachment; DM diabetes mellitus; DR diabetic retinopathy; NPDR non-proliferative diabetic retinopathy; PDR proliferative diabetic retinopathy; CSME clinically significant macular edema; DME diabetic macular edema; dbh dot blot hemorrhages; CWS cotton wool spot; POAG primary open angle glaucoma; C/D cup-to-disc ratio; HVF humphrey visual field; GVF goldmann visual field; OCT optical coherence tomography; IOP intraocular pressure; BRVO Branch retinal vein occlusion; CRVO central retinal vein occlusion; CRAO central retinal artery occlusion; BRAO branch retinal artery occlusion; RT retinal tear; SB scleral buckle; PPV pars plana vitrectomy; VH Vitreous hemorrhage; PRP panretinal laser photocoagulation; IVK intravitreal kenalog; VMT vitreomacular traction; MH Macular hole;  NVD neovascularization of the disc; NVE neovascularization elsewhere; AREDS age related eye disease study; ARMD age related macular degeneration; POAG primary open angle glaucoma; EBMD epithelial/anterior basement membrane dystrophy; ACIOL anterior chamber intraocular lens; IOL intraocular lens; PCIOL posterior chamber intraocular lens; Phaco/IOL phacoemulsification with intraocular lens placement; [place] photorefractive keratectomy; LASIK laser assisted in situ keratomileusis; HTN hypertension; DM diabetes mellitus; COPD chronic obstructive pulmonary disease

## 2017-03-28 ENCOUNTER — Ambulatory Visit (INDEPENDENT_AMBULATORY_CARE_PROVIDER_SITE_OTHER): Payer: Medicare Other | Admitting: Ophthalmology

## 2017-03-28 ENCOUNTER — Encounter (INDEPENDENT_AMBULATORY_CARE_PROVIDER_SITE_OTHER): Payer: Self-pay | Admitting: Ophthalmology

## 2017-03-28 DIAGNOSIS — H3581 Retinal edema: Secondary | ICD-10-CM

## 2017-03-28 DIAGNOSIS — A5271 Late syphilitic oculopathy: Secondary | ICD-10-CM | POA: Diagnosis not present

## 2017-03-28 DIAGNOSIS — H3091 Unspecified chorioretinal inflammation, right eye: Secondary | ICD-10-CM

## 2017-03-28 DIAGNOSIS — B20 Human immunodeficiency virus [HIV] disease: Secondary | ICD-10-CM

## 2017-03-28 DIAGNOSIS — H469 Unspecified optic neuritis: Secondary | ICD-10-CM | POA: Diagnosis not present

## 2017-03-28 DIAGNOSIS — Z21 Asymptomatic human immunodeficiency virus [HIV] infection status: Secondary | ICD-10-CM

## 2017-03-28 DIAGNOSIS — E113393 Type 2 diabetes mellitus with moderate nonproliferative diabetic retinopathy without macular edema, bilateral: Secondary | ICD-10-CM

## 2017-03-28 DIAGNOSIS — H25813 Combined forms of age-related cataract, bilateral: Secondary | ICD-10-CM

## 2017-03-28 MED ORDER — PREDNISOLONE ACETATE 1 % OP SUSP
1.0000 [drp] | Freq: Four times a day (QID) | OPHTHALMIC | 0 refills | Status: DC
Start: 2017-03-28 — End: 2017-08-13

## 2017-03-28 NOTE — Addendum Note (Signed)
Addended by: Karie ChimeraZAMORA, Lennard Capek G on: 03/28/2017 10:39 AM   Modules accepted: Orders

## 2017-03-28 NOTE — Progress Notes (Signed)
Thanks for taking care of him and making the diagnosis

## 2017-03-29 ENCOUNTER — Other Ambulatory Visit: Payer: Self-pay | Admitting: Pharmacist

## 2017-03-29 DIAGNOSIS — H209 Unspecified iridocyclitis: Secondary | ICD-10-CM | POA: Diagnosis not present

## 2017-03-30 ENCOUNTER — Other Ambulatory Visit: Payer: Self-pay | Admitting: Family Medicine

## 2017-03-30 DIAGNOSIS — E7849 Other hyperlipidemia: Secondary | ICD-10-CM

## 2017-04-01 DIAGNOSIS — Z792 Long term (current) use of antibiotics: Secondary | ICD-10-CM | POA: Diagnosis not present

## 2017-04-01 DIAGNOSIS — H209 Unspecified iridocyclitis: Secondary | ICD-10-CM | POA: Diagnosis not present

## 2017-04-01 NOTE — Progress Notes (Signed)
[facility] - Clinic Note  04/04/2017     CHIEF COMPLAINT Patient presents for Retina Follow Up   HISTORY OF PRESENT ILLNESS: Jonathan Estes is a 62 y.o. male who presents to the clinic today for:   HPI    Retina Follow Up    Patient presents with  Other.  In right eye.  This started 3 weeks ago.  Severity is mild.  Duration of 2 days.  Since onset it is rapidly improving.  I, the attending physician,  performed the HPI with the patient and updated documentation appropriately.          Comments    F/U ocular syphilis OD. Patient states his" vision has improved a lot" " I can see sites on my pistol now, I couldn't before". Last Bs 99 (One week ago,Pt does monitor BS Regularly) lAST a1c 5.7 (Unsure of date)Pt takes AREDS Qd, Uses Pred forte qtt Od Qid.       Last edited by Rennis ChrisZamora, Lonzo Saulter, MD on 04/04/2017 10:30 AM. (History)      Referring physician:  Thomasene Lotpalski, Deborah, DO [address] [place], [place] [address]  HISTORICAL INFORMATION:   Selected notes from the MEDICAL RECORD NUMBER Referred by Dr. Lenox AhrS. Burnstorf for concern of optic neuritis, wet AMD, macular separation;  LEE- 01.15.19 (S.[name]) [BCVA OD: 20/100 OS: 20/30] Ocular Hx- glaucoma suspect, APD OD PMH- type II DM, elevated cholesterol, HTN, HIV positive    CURRENT MEDICATIONS: Current Outpatient Medications (Ophthalmic Drugs)  Medication Sig  . prednisoLONE acetate (PRED FORTE) 1 % ophthalmic suspension Place 1 drop into the right eye 4 (four) times daily.  Marland Kitchen. tetrahydrozoline 0.05 % ophthalmic solution Place 1 drop into both eyes as needed (dry eyes).   No current facility-administered medications for this visit.  (Ophthalmic Drugs)   Current Outpatient Medications (Other)  Medication Sig  . ALPRAZolam (XANAX) 0.5 MG tablet TAKE 1 TABLET BY MOUTH 3 TIMES A DAY AS NEEDED FOR ANXIETY  . bictegravir-emtricitabine-tenofovir AF (BIKTARVY) 50-200-25 MG TABS tablet Take 1 tablet by mouth  daily.  . cetirizine (ZYRTEC) 10 MG tablet TAKE 1 TABLET (10 MG TOTAL) BY MOUTH DAILY.  Marland Kitchen. Cholecalciferol (VITAMIN D3) 5000 units CAPS Take 1 capsule by mouth.  . CVS LANCETS ORIGINAL MISC 1 application by Does not apply route daily.  Marland Kitchen. gemfibrozil (LOPID) 600 MG tablet 1/2 tab twice daily  . glucose blood (ACCU-CHEK AVIVA) test strip Use as instructed  . hydrochlorothiazide (MICROZIDE) 12.5 MG capsule TAKE 1 CAPSULE BY MOUTH EVERY DAY  . levothyroxine (SYNTHROID, LEVOTHROID) 25 MCG tablet TAKE 1 TABLET BY MOUTH EVERY DAY ON AN EMPTY STOMACH  . lisinopril (PRINIVIL,ZESTRIL) 40 MG tablet TAKE 1 TABLET BY MOUTH EVERY DAY  . metFORMIN (GLUCOPHAGE) 500 MG tablet Take 1 tablet (500 mg total) by mouth 2 (two) times daily with a meal.  . Multiple Vitamins-Minerals (ICAPS AREDS 2 PO) Take by mouth.  . pravastatin (PRAVACHOL) 20 MG tablet TAKE 1 TABLET BY MOUTH EVERYDAY AT BEDTIME  . zinc gluconate 50 MG tablet Take 50 mg by mouth daily.   No current facility-administered medications for this visit.  (Other)      REVIEW OF SYSTEMS: ROS    Positive for: Cardiovascular, Eyes, Allergic/Imm   Negative for: Constitutional, Gastrointestinal, Neurological, Skin, Genitourinary, Musculoskeletal, HENT, Endocrine, Respiratory, Psychiatric, Heme/Lymph   Last edited by Eldridge ScotKendrick, Glenda, LPN on 7/84/69621/31/2019 10:15 AM. (History)       ALLERGIES Allergies  Allergen Reactions  .  Sulfamethoxazole-Trimethoprim     REACTION: severe rash    PAST MEDICAL HISTORY Past Medical History:  Diagnosis Date  . AIDS (HCC) 07/12/2014  . Anxiety   . Arthritis    "hips, shoulders" (03/21/2017)  . Bilateral bunions    left foot worse  . Coronary artery disease involving native coronary artery 01/12/2015  . Diabetes mellitus type 2, controlled, without complications (HCC) 01/12/2015  . Diabetes type 2, controlled (HCC) 07/12/2014  . High triglycerides   . HTN (hypertension)   . Human immunodeficiency virus (HIV) disease  (HCC) d'x 09/2007  . Hyperlipidemia   . Hypothyroid   . 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 BRONCHOSCOPY  09/29/2007   Bronchoalveolar lavage was  obtained from the left lower lobe.  Under fluoroscopy, transbronchial Hattie Perch 07/04/2010  . CONDYLOMA EXCISION/FULGURATION  ~ 1996   anal/notes 07/04/2010    FAMILY HISTORY Family History  Problem Relation Age of Onset  . Cancer Father        Colon  . Colon cancer Father   . Leukemia Maternal Grandfather     SOCIAL HISTORY Social History   Tobacco Use  . Smoking status: Never Smoker  . Smokeless tobacco: Never Used  Substance Use Topics  . Alcohol use: Yes    Comment: 03/21/2017 "none since 01/12/2017"  . Drug use: Yes    Types: Marijuana, Cocaine    Comment: 03/21/2017 "no cocaine for a few years; I smoke marijuana qd"         OPHTHALMIC EXAM:  Base Eye Exam    Visual Acuity (Snellen - Linear)      Right Left   Dist [place] 20/20 -1 20/20       Tonometry (Tonopen, 10:14 AM)      Right Left   Pressure 13 14       Pupils      Dark Light Shape React APD   Right 4 3 Round None +2   Left 4 3 Round Brisk None       Visual Fields (Counting fingers)      Left Right    Full Full       Extraocular Movement      Right Left    Full, Ortho Full, Ortho       Neuro/Psych    Oriented x3:  Yes   Mood/Affect:  Normal       Dilation    Both eyes:  1.0% Mydriacyl, 2.5% Phenylephrine @ 10:14 AM        Slit Lamp and Fundus Exam    Slit Lamp Exam      Right Left   Lids/Lashes UL Telangiectasia, Meibomian gland dysfunction UL Telangiectasia, Meibomian gland dysfunction   Conjunctiva/Sclera White and quiet White and quiet   Cornea mild Arcus, otherwise clear mild Arcus, otherwise clear   Anterior Chamber deep, 0.5+ Cell/pigment deep, no cell   Iris Round and dilated to 6.5 mm Round and dilated to 6.5 mm   Lens 2+ Nuclear sclerosis, 2+ Cortical cataract 2+  Nuclear sclerosis, 2+ Cortical cataract   Vitreous Vitreous syneresis Vitreous syneresis       Fundus Exam      Right Left   Disc pink, sharp pink, sharp   C/D Ratio 0.2 0.2   Macula Blunted foveal reflex, Retinal pigment epithelial atrophy and mottleing - improved, No heme or edema Good foveal reflex, mild Retinal pigment epithelial mottling   Vessels Mild Copper wiring Mild  Copper wiring   Periphery Attached; subtle RPE atrophy from macula extending nasal to disc--improving; scattered MAs Attached; few MAs          IMAGING AND PROCEDURES  Imaging and Procedures for 04/04/17  OCT, Retina - OU - Both Eyes     Right Eye Quality was good. Central Foveal Thickness: 245. Progression has improved. Findings include normal foveal contour, no IRF, no SRF (Patchy ellipsoid disruption improved from prior).   Left Eye Quality was good. Central Foveal Thickness: 249. Progression has been stable. Findings include normal foveal contour, no IRF, no SRF.   Notes *Images captured and stored on drive  Diagnosis / Impression:  OD: NFP, no IRF/SRF; outer retinal atrophy and ellipsoid disruption improving further OS: NFP; no IRF/SRF  Clinical management:  See below  Abbreviations: NFP - Normal foveal profile. CME - cystoid macular edema. PED - pigment epithelial detachment. IRF - intraretinal fluid. SRF - subretinal fluid. EZ - ellipsoid zone. ERM - epiretinal membrane. ORA - outer retinal atrophy. ORT - outer retinal tubulation. 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 HIV (human immunodeficiency virus infection) (HCC) B20   5. 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 Moderate nonproliferative diabetic retinopathy of both eyes without macular edema associated with type 2 diabetes mellitus (HCC) Z61.0960   7. Retinal edema  H35.81 OCT, Retina - OU - Both Eyes  8. Combined forms of age-related cataract of both eyes H25.813     1,2,3. Ocular syphilis - Chorioretinitis, posterior uveitis OD  - pt reports onset of symptoms (decreased vision) upon waking on Saturday 03/16/17 - presented on 03/20/17 with VA decreased to 20/50 and focal area of RPE and outer retinal atrophy from fovea extending nasally across disc - OCT and fundus autofluorescence with focal outer retinal atrophy corresponding to exam - FA showed window defect in area of atrophy + late leakage temporal to fovea and hyperfluorescence of the disc - case discussed with Infectious Disease call physician, Dr. Judyann Munson - labs positive for RPR, VDRL, and FTA-Ab - acute syphilitic posterior placoid chorioretinopathy (ASPPC) - pt reports trunk, palmar and plantar rashes in July 2018, but had a negative RPR - possible false negative in setting of HIV - was admitted to [facility] and started IV PCN on 03/21/17 - discharged on 03/23/17 with PICC line for continued IV PCN at home -- tolerating well - today, VA improved to 20/20 and OCT shows further reconstitution of ellipsoid zone -- excellent response to IV PCN therapy  - mild AC cell improving -- continue PF QID OD - F/U 1-2 weeks -- pupil check by MD  4. HIV infection - diagnosed 20+ years ago - contracted through unprotected sex with women and men - has been on antiretrovirals for ~10 yrs - CD4 count >600, 10/2017 - HIV RNA undetectable, 10/2017 - management per ID service -- seeing Dr. Algis Liming this afternoon  5. Optic neuropathy OD - 2+ rAPD OD - FA: + hyperfluorescence of disc consistent with inflammation - retinal atrophy surrounding disc improving - f/u in 1-2 wks with pupil check by MD  6. Moderate nonproliferative diabetic retinopathy w/o DME, both eyes - The incidence, risk factors for progression, natural history and treatment options for diabetic retinopathy were  discussed with patient.    - The need for close monitoring of blood glucose, blood pressure, and serum lipids, avoiding cigarette or any type of tobacco, and the need for long term follow up was also discussed with patient. - exam with mild scattered MAs - FA with scattered MAs, no NV - OCT without diabetic macular edema, both eyes  - f/u in 2-3 mos  7. No retinal edema on exam or OCT  8. Combined forms of age-related cataract OU-  - The symptoms of cataract, surgical options, and treatments and risks were discussed with patient. - discussed diagnosis and progression - not yet visually significant - monitor for now   Ophthalmic Meds Ordered this visit:  No orders of the defined types were placed in this encounter.      Return in about 2 weeks (around 04/18/2017) for F/U Ocular syphilis OD.  There are no Patient Instructions on file for this visit.   Explained the diagnoses, plan, and follow up with the patient and they expressed understanding.  Patient expressed understanding of the importance of proper follow up care.   This document serves as a record of services personally performed by Karie Chimera, MD, PhD. It was created on their behalf by Virgilio Belling, COA, a certified ophthalmic assistant. The creation of this record is the provider's dictation and/or activities during the visit.  Electronically signed by: Virgilio Belling, COA  04/04/17 11:04 AM    Karie Chimera, M.D., Ph.D. Diseases & Surgery of the Retina and Vitreous [facility] 04/04/17  I have reviewed the above documentation for accuracy and completeness, and I agree with the above. Karie Chimera, M.D., Ph.D. 04/04/17 11:04 AM    Abbreviations: M myopia (nearsighted); A astigmatism; H hyperopia (farsighted); P presbyopia; Mrx spectacle prescription;  CTL contact lenses; OD right eye; OS left eye; OU both eyes  XT exotropia; ET esotropia; PEK punctate epithelial keratitis; PEE punctate epithelial  erosions; DES dry eye syndrome; MGD meibomian gland dysfunction; ATs artificial tears; PFAT's preservative free artificial tears; NSC nuclear sclerotic cataract; PSC posterior subcapsular cataract; ERM epi-retinal membrane; PVD posterior vitreous detachment; RD retinal detachment; DM diabetes mellitus; DR diabetic retinopathy; NPDR non-proliferative diabetic retinopathy; PDR proliferative diabetic retinopathy; CSME clinically significant macular edema; DME diabetic macular edema; dbh dot blot hemorrhages; CWS cotton wool spot; POAG primary open angle glaucoma; C/D cup-to-disc ratio; HVF humphrey visual field; GVF goldmann visual field; OCT optical coherence tomography; IOP intraocular pressure; BRVO Branch retinal vein occlusion; CRVO central retinal vein occlusion; CRAO central retinal artery occlusion; BRAO branch retinal artery occlusion; RT retinal tear; SB scleral buckle; PPV pars plana vitrectomy; VH Vitreous hemorrhage; PRP panretinal laser photocoagulation; IVK intravitreal kenalog; VMT vitreomacular traction; MH Macular hole;  NVD neovascularization of the disc; NVE neovascularization elsewhere; AREDS age related eye disease study; ARMD age related macular degeneration; POAG primary open angle glaucoma; EBMD epithelial/anterior basement membrane dystrophy; ACIOL anterior chamber intraocular lens; IOL intraocular lens; PCIOL posterior chamber intraocular lens; Phaco/IOL phacoemulsification with intraocular lens placement; PRK photorefractive keratectomy; LASIK laser assisted in situ keratomileusis; HTN hypertension; DM diabetes mellitus; COPD chronic obstructive pulmonary disease

## 2017-04-03 ENCOUNTER — Other Ambulatory Visit: Payer: Self-pay | Admitting: Infectious Disease

## 2017-04-03 DIAGNOSIS — F419 Anxiety disorder, unspecified: Secondary | ICD-10-CM

## 2017-04-03 LAB — HLA A,B,C LOW RESOLUTION

## 2017-04-04 ENCOUNTER — Ambulatory Visit (INDEPENDENT_AMBULATORY_CARE_PROVIDER_SITE_OTHER): Payer: Medicare Other | Admitting: Ophthalmology

## 2017-04-04 ENCOUNTER — Other Ambulatory Visit (HOSPITAL_COMMUNITY)
Admission: RE | Admit: 2017-04-04 | Discharge: 2017-04-04 | Disposition: A | Discharge status: Home / Self Care | Payer: Medicare Other | Source: Ambulatory Visit | Attending: Infectious Disease | Admitting: Infectious Disease

## 2017-04-04 ENCOUNTER — Encounter (INDEPENDENT_AMBULATORY_CARE_PROVIDER_SITE_OTHER): Payer: Self-pay | Admitting: Ophthalmology

## 2017-04-04 ENCOUNTER — Ambulatory Visit (INDEPENDENT_AMBULATORY_CARE_PROVIDER_SITE_OTHER): Payer: Medicare Other | Admitting: Infectious Disease

## 2017-04-04 ENCOUNTER — Encounter: Payer: Self-pay | Admitting: Infectious Disease

## 2017-04-04 VITALS — BP 149/84 | HR 92 | Temp 98.2°F | Ht 69.0 in | Wt 190.0 lb

## 2017-04-04 DIAGNOSIS — H25813 Combined forms of age-related cataract, bilateral: Secondary | ICD-10-CM

## 2017-04-04 DIAGNOSIS — Z21 Asymptomatic human immunodeficiency virus [HIV] infection status: Secondary | ICD-10-CM

## 2017-04-04 DIAGNOSIS — A5271 Late syphilitic oculopathy: Secondary | ICD-10-CM | POA: Insufficient documentation

## 2017-04-04 DIAGNOSIS — Z23 Encounter for immunization: Secondary | ICD-10-CM

## 2017-04-04 DIAGNOSIS — B20 Human immunodeficiency virus [HIV] disease: Secondary | ICD-10-CM

## 2017-04-04 DIAGNOSIS — H3091 Unspecified chorioretinal inflammation, right eye: Secondary | ICD-10-CM | POA: Diagnosis not present

## 2017-04-04 DIAGNOSIS — H469 Unspecified optic neuritis: Secondary | ICD-10-CM

## 2017-04-04 DIAGNOSIS — E1121 Type 2 diabetes mellitus with diabetic nephropathy: Secondary | ICD-10-CM

## 2017-04-04 DIAGNOSIS — E113393 Type 2 diabetes mellitus with moderate nonproliferative diabetic retinopathy without macular edema, bilateral: Secondary | ICD-10-CM

## 2017-04-04 DIAGNOSIS — I2510 Atherosclerotic heart disease of native coronary artery without angina pectoris: Secondary | ICD-10-CM | POA: Diagnosis not present

## 2017-04-04 DIAGNOSIS — H3581 Retinal edema: Secondary | ICD-10-CM | POA: Diagnosis not present

## 2017-04-04 HISTORY — DX: Late syphilitic oculopathy: A52.71

## 2017-04-04 NOTE — Progress Notes (Signed)
Thanks for making diagnosis. I wish ALL labs checked for Pro-zone effect. That could have been the issue.

## 2017-04-04 NOTE — Progress Notes (Signed)
"chief complaint: follow-up for HIV and recently diagnosed ocular syphilis Subjective:    Patient ID: Oneida ArenasMicheal B Stelmach, male    DOB: April 29, 1955, 62 y.o.   MRN: [id number]  HPI   Karan B Rocco Serenemick is a 62 y.o. male who is doing superbly well on their antiviral regimen of isentress and truvada, with undetectable viral load and health cd4 count.      Lab Results  Component Value Date   HIV1RNAQUANT 26 (H) 03/21/2017   HIV1RNAQUANT <40 12/01/2015   HIV1RNAQUANT <40 12/01/2015   Lab Results  Component Value Date   CD4TABS 440 03/21/2017   CD4TABS 641 01/12/2015   CD4TABS 670 05/25/2014    He was recently diagnosed with ocular syphilis. He claims he must have contracted this from trip to a "whore house" when he drank hard liquor and a "[place] joint" His RPR was negative in August with us but could have been falsely NEG due to prozone. This January he was diagnosed with ocular syphilis by ophthalmology. He was admitted and placed on IV PCN and is just finishing his last dose today. He states his vision began to "immediately improve on IV PCN."  He is being followed closely by  Ophthalmology. I recommended also checking him for GC and chlamydia in OP and urine he claimed not to be having sex with men and having any receptive intercourse.   He is stressed out because his water pump is not working and he is under financial stress due to copays for home health and cost related to his syphilis.   Past Medical History:  Diagnosis Date  . AIDS (HCC) 07/12/2014  . Anxiety   . Arthritis    "hips, shoulders" (03/21/2017)  . Bilateral bunions    left foot worse  . Coronary artery disease involving native coronary artery 01/12/2015  . Diabetes mellitus type 2, controlled, without complications (HCC) 01/12/2015  . Diabetes type 2, controlled (HCC) 07/12/2014  . High triglycerides   . HTN (hypertension)   . Human immunodeficiency virus (HIV) disease (HCC) d'x 09/2007  . Hyperlipidemia   .  Hypothyroid   . 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 BRONCHOSCOPY  09/29/2007   Bronchoalveolar lavage was  obtained from the left lower lobe.  Under fluoroscopy, transbronchial Hattie Perch/notes 07/04/2010  . CONDYLOMA EXCISION/FULGURATION  ~ 1996   anal/notes 07/04/2010    Family History  Problem Relation Age of Onset  . Cancer Father        Colon  . Colon cancer Father   . Leukemia Maternal Grandfather       Social History   Socioeconomic History  . Marital status: Single    Spouse name: None  . Number of children: None  . Years of education: None  . Highest education level: None  Social Needs  . Financial resource strain: None  . Food insecurity - worry: None  . Food insecurity - inability: None  . Transportation needs - medical: None  . Transportation needs - non-medical: None  Occupational History  . None  Tobacco Use  . Smoking status: Never Smoker  . Smokeless tobacco: Never Used  Substance and Sexual Activity  . Alcohol use: Yes    Comment: 03/21/2017 "none since 01/12/2017"  . Drug use: Yes    Types: Marijuana, Cocaine    Comment: 03/21/2017 "no cocaine for a few years; I smoke marijuana qd"  . Sexual activity: No  Partners: Female  Other Topics Concern  . None  Social History Narrative   Lives alone.          Allergies  Allergen Reactions  . Sulfamethoxazole-Trimethoprim     REACTION: severe rash     Current Outpatient Medications:  .  ALPRAZolam (XANAX) 0.5 MG tablet, TAKE 1 TABLET BY MOUTH 3 TIMES A DAY AS NEEDED FOR ANXIETY, Disp: 90 tablet, Rfl: 0 .  bictegravir-emtricitabine-tenofovir AF (BIKTARVY) 50-200-25 MG TABS tablet, Take 1 tablet by mouth daily., Disp: 30 tablet, Rfl: 11 .  cetirizine (ZYRTEC) 10 MG tablet, TAKE 1 TABLET (10 MG TOTAL) BY MOUTH DAILY., Disp: 90 tablet, Rfl: 2 .  Cholecalciferol (VITAMIN D3) 5000 units CAPS, Take 1 capsule by mouth.,  Disp: , Rfl:  .  CVS LANCETS ORIGINAL MISC, 1 application by Does not apply route daily., Disp: 90 each, Rfl: 4 .  gemfibrozil (LOPID) 600 MG tablet, 1/2 tab twice daily, Disp: 90 tablet, Rfl: 1 .  glucose blood (ACCU-CHEK AVIVA) test strip, Use as instructed, Disp: 100 each, Rfl: 12 .  hydrochlorothiazide (MICROZIDE) 12.5 MG capsule, TAKE 1 CAPSULE BY MOUTH EVERY DAY, Disp: 90 capsule, Rfl: 0 .  levothyroxine (SYNTHROID, LEVOTHROID) 25 MCG tablet, TAKE 1 TABLET BY MOUTH EVERY DAY ON AN EMPTY STOMACH, Disp: 90 tablet, Rfl: 0 .  lisinopril (PRINIVIL,ZESTRIL) 40 MG tablet, TAKE 1 TABLET BY MOUTH EVERY DAY, Disp: 90 tablet, Rfl: 3 .  metFORMIN (GLUCOPHAGE) 500 MG tablet, Take 1 tablet (500 mg total) by mouth 2 (two) times daily with a meal., Disp: 180 tablet, Rfl: 0 .  Multiple Vitamins-Minerals (ICAPS AREDS 2 PO), Take by mouth., Disp: , Rfl:  .  pravastatin (PRAVACHOL) 20 MG tablet, TAKE 1 TABLET BY MOUTH EVERYDAY AT BEDTIME, Disp: 90 tablet, Rfl: 0 .  prednisoLONE acetate (PRED FORTE) 1 % ophthalmic suspension, Place 1 drop into the right eye 4 (four) times daily., Disp: 10 mL, Rfl: 0 .  tetrahydrozoline 0.05 % ophthalmic solution, Place 1 drop into both eyes as needed (dry eyes)., Disp: , Rfl:  .  zinc gluconate 50 MG tablet, Take 50 mg by mouth daily., Disp: , Rfl:     Review of Systems  Constitutional: Negative for activity change, appetite change, chills, diaphoresis, fatigue, fever and unexpected weight change.  HENT: Negative for congestion, rhinorrhea, sinus pressure, sneezing, sore throat and trouble swallowing.   Eyes: Negative for photophobia and visual disturbance.  Respiratory: Negative for cough, chest tightness, shortness of breath, wheezing and stridor.   Cardiovascular: Negative for chest pain, palpitations and leg swelling.  Gastrointestinal: Negative for abdominal distention, abdominal pain, anal bleeding, blood in stool, constipation, diarrhea, nausea and vomiting.   Genitourinary: Negative for difficulty urinating, dysuria, flank pain and hematuria.  Musculoskeletal: Negative for arthralgias, back pain, gait problem, joint swelling and myalgias.  Skin: Negative for color change, pallor, rash and wound.  Neurological: Negative for dizziness, tremors, weakness and light-headedness.  Hematological: Negative for adenopathy. Does not bruise/bleed easily.  Psychiatric/Behavioral: Negative for agitation, behavioral problems, confusion, decreased concentration, dysphoric mood and sleep disturbance.       Objective:   Physical Exam  Constitutional: He is oriented to person, place, and time. He appears well-developed and well-nourished. No distress.  HENT:  Head: Normocephalic and atraumatic.  Mouth/Throat: Oropharynx is clear and moist. No oropharyngeal exudate.  [name] was wearing sun glasses for much of hte interview and had just come from eye eappt  Eyes: Conjunctivae and EOM are normal. Pupils are equal, round,  and reactive to light. No scleral icterus.  Neck: Normal range of motion. Neck supple. No JVD present.  Cardiovascular: Normal rate, regular rhythm and normal heart sounds.  Pulmonary/Chest: Effort normal. No respiratory distress. He has no wheezes.  Abdominal: He exhibits no distension.  Musculoskeletal: He exhibits no edema or tenderness.  Lymphadenopathy:    He has no cervical adenopathy.  Neurological: He is alert and oriented to person, place, and time. He has normal reflexes. He exhibits normal muscle tone. Coordination normal.  Skin: Skin is warm and dry. He is not diaphoretic. No erythema. No pallor.  Psychiatric: He has a normal mood and affect. His behavior is normal. Judgment and thought content normal.  Nursing note and vitals reviewed.         Assessment & Plan:   Ocular syphilis: will follow titers in a few months. Never had LP but I dont think he really needed one  ZOX:WRUEAVWU BIKTARVY, RTC in 4  months  Hypothyroidism: continue synthroid which is being prescribed by primary care physician  DM: very good control, last A1c was 6 followed by primary care physician  HTN: BP up needs followup with PCP Vitals:   04/04/17 1434  BP: (!) 149/84  Pulse: 92  Temp: 98.2 F (36.8 C)     CAD: seen on CT angio. No need for new interventions, no ssx, on statin, Asa  Hyperlipidemia: on statin  Anxiety , generalized: continue prn xanax

## 2017-04-05 ENCOUNTER — Other Ambulatory Visit: Payer: Self-pay | Admitting: Pharmacist

## 2017-04-05 ENCOUNTER — Telehealth: Payer: Self-pay

## 2017-04-05 DIAGNOSIS — H209 Unspecified iridocyclitis: Secondary | ICD-10-CM | POA: Diagnosis not present

## 2017-04-05 LAB — URINE CYTOLOGY ANCILLARY ONLY
Chlamydia: NEGATIVE
NEISSERIA GONORRHEA: NEGATIVE

## 2017-04-05 LAB — CYTOLOGY, (ORAL, ANAL, URETHRAL) ANCILLARY ONLY
CHLAMYDIA, DNA PROBE: NEGATIVE
NEISSERIA GONORRHEA: NEGATIVE

## 2017-04-05 NOTE — Telephone Encounter (Signed)
Called pharmacy to inform them that the patients Alprazolam was approved by Dr. Daiva EvesVan Dam and that the pharmacy could dispense the medication for the patient. I spoke with Joe the pharmacist who was able to take these order and stated he would get the medication ready for the patient to pick up. Lorenso CourierJose L Maldonado, [place]

## 2017-04-05 NOTE — Telephone Encounter (Signed)
[facility] calling to see if PICC can be pulled. Per Dr Daiva EvesVan Dam it is okay to pull PICC.  Laurell Josephsammy K King, RN

## 2017-04-08 ENCOUNTER — Other Ambulatory Visit: Payer: Self-pay

## 2017-04-10 ENCOUNTER — Ambulatory Visit: Payer: Self-pay | Admitting: Family Medicine

## 2017-04-11 NOTE — Progress Notes (Signed)
[facility] - Clinic Note  04/16/2017     CHIEF COMPLAINT Patient presents for Retina Follow Up   HISTORY OF PRESENT ILLNESS: Jonathan Estes is a 62 y.o. male who presents to the clinic today for:   HPI    Retina Follow Up    Patient presents with  Other.  In right eye.  This started 4 weeks ago.  Severity is mild.  Since onset it is gradually improving.  I, the attending physician,  performed the HPI with the patient and updated documentation appropriately.          Comments    F/U RPE atrophy OD. Patient. Patient states " I can see my site on the pistol now" VA is improving gradually.Bs( Not checking on regular basic usually  95-115) A1c 5.7 (04/15/17).       Last edited by Rennis Chris, MD on 04/16/2017 10:39 AM. (History)    Pt reports he is going back to [facility] in May;   Referring physician:  [name], DO [address] [place], [place] [address]  HISTORICAL INFORMATION:   Selected notes from the MEDICAL RECORD NUMBER Referred by Dr. Lenox Ahr for concern of optic neuritis, wet AMD, macular separation;  LEE- 01.15.19 (S.[name]) [BCVA OD: 20/100 OS: 20/30] Ocular Hx- glaucoma suspect, APD OD PMH- type II DM, elevated cholesterol, HTN, HIV positive    CURRENT MEDICATIONS: Current Outpatient Medications (Ophthalmic Drugs)  Medication Sig  . prednisoLONE acetate (PRED FORTE) 1 % ophthalmic suspension Place 1 drop into the right eye 4 (four) times daily.   No current facility-administered medications for this visit.  (Ophthalmic Drugs)   Current Outpatient Medications (Other)  Medication Sig  . ALPRAZolam (XANAX) 0.5 MG tablet TAKE 1 TABLET BY MOUTH 3 TIMES A DAY AS NEEDED FOR ANXIETY  . bictegravir-emtricitabine-tenofovir AF (BIKTARVY) 50-200-25 MG TABS tablet Take 1 tablet by mouth daily.  . cetirizine (ZYRTEC) 10 MG tablet TAKE 1 TABLET (10 MG TOTAL) BY MOUTH DAILY.  Marland Kitchen Cholecalciferol (VITAMIN D3) 5000 units CAPS Take 1 capsule  (5,000 Units total) by mouth daily.  . [employer] LANCETS ORIGINAL MISC 1 application by Does not apply route daily.  Marland Kitchen gemfibrozil (LOPID) 600 MG tablet 1/2 tab twice daily  . glucose blood (ACCU-CHEK AVIVA) test strip Use as instructed  . hydrochlorothiazide (MICROZIDE) 12.5 MG capsule Take 1 capsule (12.5 mg total) by mouth daily.  Marland Kitchen levothyroxine (SYNTHROID, LEVOTHROID) 25 MCG tablet TAKE 1 TABLET BY MOUTH EVERY DAY ON AN EMPTY STOMACH  . lisinopril (PRINIVIL,ZESTRIL) 40 MG tablet Take 1 tablet (40 mg total) by mouth daily.  . metFORMIN (GLUCOPHAGE) 500 MG tablet Take 1 tablet (500 mg total) by mouth 2 (two) times daily with a meal.  . Multiple Vitamins-Minerals (ICAPS AREDS 2 PO) Take by mouth.  . pravastatin (PRAVACHOL) 20 MG tablet TAKE 1 TABLET BY MOUTH EVERYDAY AT BEDTIME  . zinc gluconate 50 MG tablet Take 50 mg by mouth daily.   No current facility-administered medications for this visit.  (Other)      REVIEW OF SYSTEMS: ROS    Positive for: Endocrine, Eyes   Last edited by Eldridge Scot, LPN on 08/07/5407  9:33 AM. (History)       ALLERGIES Allergies  Allergen Reactions  . Sulfamethoxazole-Trimethoprim     REACTION: severe rash    PAST MEDICAL HISTORY Past Medical History:  Diagnosis Date  . AIDS (HCC) 07/12/2014  . Anxiety   . Arthritis    "hips,  shoulders" (03/21/2017)  . Bilateral bunions    left foot worse  . Coronary artery disease involving native coronary artery 01/12/2015  . Diabetes mellitus type 2, controlled, without complications (HCC) 01/12/2015  . Diabetes type 2, controlled (HCC) 07/12/2014  . High triglycerides   . HTN (hypertension)   . Human immunodeficiency virus (HIV) disease (HCC) d'x 09/2007  . Hyperlipidemia   . Hypothyroid   . 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 BRONCHOSCOPY  09/29/2007   Bronchoalveolar lavage was  obtained from the left lower  lobe.  Under fluoroscopy, transbronchial Hattie Perch/notes 07/04/2010  . CONDYLOMA EXCISION/FULGURATION  ~ 1996   anal/notes 07/04/2010    FAMILY HISTORY Family History  Problem Relation Age of Onset  . Colon cancer Father   . Leukemia Maternal Grandfather     SOCIAL HISTORY Social History   Tobacco Use  . Smoking status: Never Smoker  . Smokeless tobacco: Never Used  Substance Use Topics  . Alcohol use: Yes    Comment: 03/21/2017 "none since 01/12/2017"  . Drug use: Yes    Types: Marijuana, Cocaine    Comment: 03/21/2017 "no cocaine for a few years; I smoke marijuana qd"         OPHTHALMIC EXAM:  Base Eye Exam    Visual Acuity (Snellen - Linear)      Right Left   Dist [place] 20/20-1 20/20   Dist ph [place] NI NI       Tonometry (Tonopen, 9:57 AM)      Right Left   Pressure 14 12       Pupils      Dark Light Shape React APD   Right 4 2.5 Round Brisk None   Left 4 2 Round Brisk None       Visual Fields (Counting fingers)      Left Right    Full Full       Extraocular Movement      Right Left    Ortho Ortho    -- -- --  --  --  -- -- --   -- -- --  --  --  -- -- --         Neuro/Psych    Oriented x3:  Yes   Mood/Affect:  Normal       Dilation    Both eyes:  1.0% Mydriacyl, 2.5% Phenylephrine @ 9:57 AM        Slit Lamp and Fundus Exam    Slit Lamp Exam      Right Left   Lids/Lashes UL Telangiectasia, Meibomian gland dysfunction UL Telangiectasia, Meibomian gland dysfunction   Conjunctiva/Sclera White and quiet White and quiet   Cornea mild Arcus, otherwise clear mild Arcus, otherwise clear   Anterior Chamber deep, 0.5+ Cell/pigment deep, no cell   Iris Round and dilated to 6.5 mm Round and dilated to 6.5 mm   Lens 2+ Nuclear sclerosis, 2+ Cortical cataract 2+ Nuclear sclerosis, 2+ Cortical cataract   Vitreous Vitreous syneresis Vitreous syneresis       Fundus Exam      Right Left   Disc pink, sharp pink, sharp   C/D Ratio 0.2 0.2   Macula foveal reflex -  improved, Retinal pigment epithelial atrophy and mottleing - improved, No heme or edema Good foveal reflex, mild Retinal pigment epithelial mottling   Vessels Mild Copper wiring Mild Copper wiring   Periphery Attached; subtle RPE atrophy  from macula extending nasal to disc--improving; scattered MAs Attached; few MAs          IMAGING AND PROCEDURES  Imaging and Procedures for 04/16/17  OCT, Retina - OU - Both Eyes     Right Eye Quality was good. Central Foveal Thickness: 241. Progression has improved. Findings include normal foveal contour, no IRF, no SRF (Patchy ellipsoid disruption almost back to baseline).   Left Eye Quality was good. Central Foveal Thickness: 249. Progression has been stable. Findings include normal foveal contour, no IRF, no SRF.   Notes *Images captured and stored on drive  Diagnosis / Impression:  OD: NFP, no IRF/SRF; outer retinal atrophy and ellipsoid disruption almost back to baseline OS: NFP; no IRF/SRF  Clinical management:  See below  Abbreviations: NFP - Normal foveal profile. CME - cystoid macular edema. PED - pigment epithelial detachment. IRF - intraretinal fluid. SRF - subretinal fluid. EZ - ellipsoid zone. ERM - epiretinal membrane. ORA - outer retinal atrophy. ORT - outer retinal tubulation. 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 HIV (human immunodeficiency virus infection) (HCC) B20   5. 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 Moderate nonproliferative diabetic retinopathy of both eyes without macular edema associated with type 2 diabetes mellitus (HCC) Z61.0960   7. Retinal edema H35.81 OCT, Retina - OU - Both Eyes  8. Combined forms of age-related cataract of both eyes H25.813     1,2,3. Ocular syphilis - Chorioretinitis, posterior uveitis OD   - pt reports onset of symptoms (decreased vision) upon waking on Saturday 03/16/17 - presented on 03/20/17 with VA decreased to 20/50 and focal area of RPE and outer retinal atrophy from fovea extending nasally across disc - OCT and fundus autofluorescence with focal outer retinal atrophy corresponding to exam - FA showed window defect in area of atrophy + late leakage temporal to fovea and hyperfluorescence of the disc - case discussed with Infectious Disease call physician, Dr. Judyann Munson - labs positive for RPR, VDRL, and FTA-Ab - acute syphilitic posterior placoid chorioretinopathy (ASPPC) - pt reports trunk, palmar and plantar rashes in July 2018, but had a negative RPR - possible false negative in setting of HIV - was admitted to [facility] and started IV PCN on 03/21/17 - discharged on 03/23/17 with PICC line for continued IV PCN at home -- completed 14 days of antibiotic therapy - today, VA improved to 20/20 and OCT shows further reconstitution of ellipsoid zone -- excellent response to IV PCN therapy  - mild AC cell improving -- continue PF QID OD - F/U 3-4 weeks  4. HIV infection - diagnosed 20+ years ago - contracted through unprotected sex with women and men - has been on antiretrovirals for ~10 yrs - CD4 count >600, 10/2017 - HIV RNA undetectable, 10/2017 - management per ID service -- seeing Dr. Algis Liming this afternoon  5. Optic neuropathy OD - resolved - originally, had 2+ rAPD OD - FA: + hyperfluorescence of disc consistent with inflammation - retinal atrophy surrounding disc improving  6. Moderate nonproliferative diabetic retinopathy w/o DME, both eyes - The incidence, risk factors for progression, natural history and treatment options for diabetic retinopathy were discussed with patient.   - The  need for close monitoring of blood glucose, blood pressure, and serum lipids, avoiding cigarette or any type of tobacco, and the need for long term follow up was also  discussed with patient. - exam with mild scattered MAs - FA with scattered MAs, no NV - OCT without diabetic macular edema, both eyes  - f/u in 2-3 mos  7. No retinal edema on exam or OCT  8. Combined forms of age-related cataract OU-  - The symptoms of cataract, surgical options, and treatments and risks were discussed with patient. - discussed diagnosis and progression - not yet visually significant - monitor for now   Ophthalmic Meds Ordered this visit:  No orders of the defined types were placed in this encounter.      Return in about 3 weeks (around 05/07/2017) for F/U ocular syphillis-chorioretinitis, post uveitis OD.  There are no Patient Instructions on file for this visit.   Explained the diagnoses, plan, and follow up with the patient and they expressed understanding.  Patient expressed understanding of the importance of proper follow up care.   This document serves as a record of services personally performed by Karie Chimera, MD, PhD. It was created on their behalf by Virgilio Belling, COA, a certified ophthalmic assistant. The creation of this record is the provider's dictation and/or activities during the visit.  Electronically signed by: Virgilio Belling, COA  04/16/17 11:26 AM    Karie Chimera, M.D., Ph.D. Diseases & Surgery of the Retina and Vitreous [facility] 04/16/17   I have reviewed the above documentation for accuracy and completeness, and I agree with the above. Karie Chimera, M.D., Ph.D. 04/16/17 11:29 AM    Abbreviations: M myopia (nearsighted); A astigmatism; H hyperopia (farsighted); P presbyopia; Mrx spectacle prescription;  CTL contact lenses; OD right eye; OS left eye; OU both eyes  XT exotropia; ET esotropia; PEK punctate epithelial keratitis; PEE punctate epithelial erosions; DES dry eye syndrome; MGD meibomian gland dysfunction; ATs artificial tears; PFAT's preservative free artificial tears; NSC nuclear  sclerotic cataract; PSC posterior subcapsular cataract; ERM epi-retinal membrane; PVD posterior vitreous detachment; RD retinal detachment; DM diabetes mellitus; DR diabetic retinopathy; NPDR non-proliferative diabetic retinopathy; PDR proliferative diabetic retinopathy; CSME clinically significant macular edema; DME diabetic macular edema; dbh dot blot hemorrhages; CWS cotton wool spot; POAG primary open angle glaucoma; C/D cup-to-disc ratio; HVF humphrey visual field; GVF goldmann visual field; OCT optical coherence tomography; IOP intraocular pressure; BRVO Branch retinal vein occlusion; CRVO central retinal vein occlusion; CRAO central retinal artery occlusion; BRAO branch retinal artery occlusion; RT retinal tear; SB scleral buckle; PPV pars plana vitrectomy; VH Vitreous hemorrhage; PRP panretinal laser photocoagulation; IVK intravitreal kenalog; VMT vitreomacular traction; MH Macular hole;  NVD neovascularization of the disc; NVE neovascularization elsewhere; AREDS age related eye disease study; ARMD age related macular degeneration; POAG primary open angle glaucoma; EBMD epithelial/anterior basement membrane dystrophy; ACIOL anterior chamber intraocular lens; IOL intraocular lens; PCIOL posterior chamber intraocular lens; Phaco/IOL phacoemulsification with intraocular lens placement; PRK photorefractive keratectomy; LASIK laser assisted in situ keratomileusis; HTN hypertension; DM diabetes mellitus; COPD chronic obstructive pulmonary disease

## 2017-04-15 ENCOUNTER — Ambulatory Visit (INDEPENDENT_AMBULATORY_CARE_PROVIDER_SITE_OTHER): Payer: Medicare Other | Admitting: Family Medicine

## 2017-04-15 ENCOUNTER — Encounter: Payer: Self-pay | Admitting: Family Medicine

## 2017-04-15 VITALS — BP 120/77 | HR 67 | Ht 69.0 in | Wt 192.0 lb

## 2017-04-15 DIAGNOSIS — E559 Vitamin D deficiency, unspecified: Secondary | ICD-10-CM

## 2017-04-15 DIAGNOSIS — E1159 Type 2 diabetes mellitus with other circulatory complications: Secondary | ICD-10-CM

## 2017-04-15 DIAGNOSIS — E119 Type 2 diabetes mellitus without complications: Secondary | ICD-10-CM

## 2017-04-15 DIAGNOSIS — I10 Essential (primary) hypertension: Secondary | ICD-10-CM

## 2017-04-15 DIAGNOSIS — B20 Human immunodeficiency virus [HIV] disease: Secondary | ICD-10-CM

## 2017-04-15 DIAGNOSIS — E782 Mixed hyperlipidemia: Secondary | ICD-10-CM

## 2017-04-15 DIAGNOSIS — E038 Other specified hypothyroidism: Secondary | ICD-10-CM

## 2017-04-15 DIAGNOSIS — R5382 Chronic fatigue, unspecified: Secondary | ICD-10-CM

## 2017-04-15 DIAGNOSIS — E1121 Type 2 diabetes mellitus with diabetic nephropathy: Secondary | ICD-10-CM

## 2017-04-15 DIAGNOSIS — E1169 Type 2 diabetes mellitus with other specified complication: Secondary | ICD-10-CM

## 2017-04-15 LAB — POCT GLYCOSYLATED HEMOGLOBIN (HGB A1C): Hemoglobin A1C: 5.7

## 2017-04-15 MED ORDER — PRAVASTATIN SODIUM 20 MG PO TABS: ORAL_TABLET | ORAL | 1 refills | Status: DC

## 2017-04-15 MED ORDER — LISINOPRIL 40 MG PO TABS: 40.0000 mg | ORAL_TABLET | Freq: Every day | ORAL | 1 refills | Status: DC

## 2017-04-15 MED ORDER — HYDROCHLOROTHIAZIDE 12.5 MG PO CAPS: 12.5000 mg | ORAL_CAPSULE | Freq: Every day | ORAL | 1 refills | Status: DC

## 2017-04-15 MED ORDER — GEMFIBROZIL 600 MG PO TABS: ORAL_TABLET | ORAL | 1 refills | Status: DC

## 2017-04-15 MED ORDER — VITAMIN D3 125 MCG (5000 UT) PO CAPS: 1.0000 | ORAL_CAPSULE | Freq: Every day | ORAL | 4 refills | Status: DC

## 2017-04-15 MED ORDER — LEVOTHYROXINE SODIUM 25 MCG PO TABS: ORAL_TABLET | ORAL | 1 refills | Status: DC

## 2017-04-15 MED ORDER — METFORMIN HCL 500 MG PO TABS: 500.0000 mg | ORAL_TABLET | Freq: Two times a day (BID) | ORAL | 1 refills | Status: DC

## 2017-04-15 NOTE — Patient Instructions (Signed)

## 2017-04-15 NOTE — Progress Notes (Signed)
Impression and Recommendations:    1. Controlled type 2 diabetes mellitus without complication, without long-term current use of insulin ([place])   2. Diabetic nephropathy associated with type 2 diabetes mellitus: Microalbuminuria   3. Dyslipidemia with low high density lipoprotein (HDL) cholesterol with hypertriglyceridemia due to type 2 diabetes mellitus ([place])   4. Hypertension associated with diabetes ([place])   5. Mixed diabetic hyperlipidemia associated with type 2 diabetes mellitus ([place])   6. Human immunodeficiency virus (HIV) disease ([place])   7. Hypertension, unspecified type   8. Other specified hypothyroidism   9. Vitamin D deficiency     1. DM2 without complication- N1A today 5.7. Pt is compliant with his medications and tolerating them well. Continue checking blood sugars at home, especially fasting and after eating poorly. Refill meds.  2. Diabetic nephropathy- drink adequate amounts of water equal to one half of your weight in oz of water per day. CMP stable.  3. Dyslipidemia- continue meds, stable. Refilled meds today.  4. HTN - Pt is compliant with medications and tolerating well without complication. Refill meds today. Continue checking BP at home.   5. Mixed diabetic hyperlipidemia-refill meds, stable at this time.  6. HIV- Pt is compliant with his antiviral medications and tolerating them well. Continue as prescribed. Viral loads from 3 weeks ago were 26. CD4 440. He is being monitored by infectious disease closely. Discussed the importance of using condoms.  7. Hypertension- refill meds, stable at this time.  8. Hypothyroidism- Pt is compliant with meds and tolerating them well. Continue taking medications as prescribed. Refill meds.  9. Hyperlipidemia- pt is compliant with meds and tolerating them well. Dietary and exercise guidelines discussed with patient. Recommended pt to reduce intake of saturated, trans fats and fatty carbohydrates. Handouts provided if  desired.  10. Hypertriglyceridemia- Continue watching what you eat. Refill meds.  11. Vitamin D deficiency- continue supplements as described below.  -Follow up in 4 months for FBW.     Education and routine counseling performed. Handouts provided.  No orders of the defined types were placed in this encounter.   No orders of the defined types were placed in this encounter.    The patient was counseled, risk factors were discussed, anticipatory guidance given.  Gross side effects, risk and benefits, and alternatives of medications discussed with patient.  Patient is aware that all medications have potential side effects and we are unable to predict every side effect or drug-drug interaction that may occur.  Expresses verbal understanding and consents to current therapy plan and treatment regimen.  No Follow-up on file.  Please see AVS handed out to patient at the end of our visit for further patient instructions/ counseling done pertaining to today's office visit.    Note: This document was prepared using Dragon voice recognition software and may include unintentional dictation errors.   This document serves as a record of services personally performed by Mellody Dance, DO. It was created on her behalf by Mayer Masker, a trained medical scribe. The creation of this record is based on the scribe's personal observations and the provider's statements to them.   I have reviewed the above medical documentation for accuracy and completeness and I concur.  Mellody Dance 04/15/17 10:15 AM   Subjective:    HPI: Jonathan Estes is a 62 y.o. male who presents to [place] at [facility] today for follow up for HTN, pre-DM, HLD, and other conditions below.    HTN:  -  His blood pressure has been controlled at home. He states his BP at home has been, on average: 121/65-70.  - Patient reports good compliance with blood pressure medications  - Denies medication  S-E   - Smoking Status noted   - He denies new onset of: chest pain, exercise intolerance, shortness of breath, dizziness, visual changes, headache, lower extremity swelling or claudication.   Last 3 blood pressure readings in our office are as follows: BP Readings from Last 3 Encounters:  04/15/17 120/77  04/04/17 (!) 149/84  03/23/17 118/74    Pulse Readings from Last 3 Encounters:  04/04/17 92  03/23/17 77  03/21/17 77   DM HPI:  A1c today is 5.7 -  He has been working on diet and exercise for diabetes. He states he is eating anything he wants, but is eating less of it.   Pt is currently maintained on the following medications for diabetes:   see med list today  Medication compliance - yes, he takes one in the morning and one in the evening. He is tolerating well.   Home glucose readings range: average 101, but he is unsure. He states only a few times it has been over 120 when he eats poorly.   Denies polyuria/polydipsia.  Denies hypo/ hyperglycemia symptoms  Last diabetic eye exam was  Lab Results  Component Value Date   HMDIABEYEEXA No Retinopathy 11/29/2016    Foot exam- UTD  Last A1C in the office was:  Lab Results  Component Value Date   HGBA1C 5.7 04/15/2017   HGBA1C 6.0 (H) 10/04/2016   HGBA1C 5.7 (H) 05/30/2016    Lab Results  Component Value Date   MICROALBUR 30 11/26/2016   LDLCALC 61 11/26/2016   CREATININE 0.97 03/21/2017      Wt Readings from Last 3 Encounters:  04/04/17 190 lb (86.2 kg)  03/21/17 190 lb (86.2 kg)  03/21/17 190 lb (86.2 kg)    BP Readings from Last 3 Encounters:  04/04/17 (!) 149/84  03/23/17 118/74  03/21/17 (!) 152/88    Pulse Readings from Last 3 Encounters:  04/04/17 92  03/23/17 77  03/21/17 77    Thyroid: He takes synthroid daily and reports feeling better.  Vit D: Takes daily supplements. He notices he feels better, more energetic, etc.   Eye-He also takes OTC supplements for uveitis.   HIV- pt is  following up with infectious disease in 4 months.  CHOL HPI:   -  He  is currently managed with:  See med list from today  Txmnt compliance- pt is taking medications as prescribed.  Patient reports very little compliance with low chol/ saturated and trans fat diet.  No exercise. He states he is eating what he wants, but just in less quantities.  RUQ pain- NA    Muscle aches- NA  No other s-e  Last lipid panel as follows:  Lab Results  Component Value Date   CHOL 130 11/26/2016   HDL 26 (L) 11/26/2016   LDLCALC 61 11/26/2016   TRIG 215 (H) 11/26/2016   CHOLHDL 5.0 11/26/2016    Hepatic Function Latest Ref Rng & Units 03/21/2017 03/21/2017 02/06/2017  Total Protein 6.5 - 8.1 g/dL 8.3(H) 8.2(H) 7.7  Albumin 3.5 - 5.0 g/dL 4.3 - -  AST 15 - 41 U/L _0 ALT 17 - 63 U/L 16(L) 16 11  Alk Phosphatase 38 - 126 U/L 99 - -  Total Bilirubin 0.3 - 1.2 mg/dL 0.7 0.4 0.4  Bilirubin, Direct 0.0 - 0.3 mg/dL - - -   There were no vitals filed for this visit.    Patient Care Team    Relationship Specialty Notifications Start End  Mellody Dance, DO PCP - General Family Medicine  11/26/16   Inda Castle, MD (Inactive) Consulting Physician Gastroenterology  07/23/16   Tommy Medal, Lavell Islam, MD Consulting Physician Infectious Diseases  12/31/16    Comment: HIV doc  Beverely Low, OD Referring Physician Optometry  12/31/16      Lab Results  Component Value Date   CREATININE 0.97 03/21/2017   BUN 15 03/21/2017   NA 136 03/21/2017   K 3.4 (L) 03/21/2017   CL 98 (L) 03/21/2017   CO2 25 03/21/2017    Lab Results  Component Value Date   CHOL 130 11/26/2016   CHOL 125 10/04/2016   CHOL 128 12/01/2015    Lab Results  Component Value Date   HDL 26 (L) 11/26/2016   HDL 15 (L) 10/04/2016   HDL 36 (L) 12/01/2015    Lab Results  Component Value Date   LDLCALC 61 11/26/2016   LDLCALC 59 10/04/2016   LDLCALC 31 12/01/2015    Lab Results  Component Value Date   TRIG  215 (H) 11/26/2016   TRIG 253 (H) 10/04/2016   TRIG 307 (H) 12/01/2015    Lab Results  Component Value Date   CHOLHDL 5.0 11/26/2016   CHOLHDL 8.3 (H) 10/04/2016   CHOLHDL 3.6 12/01/2015    No results found for: LDLDIRECT ===================================================================   Patient Active Problem List   Diagnosis Date Noted  . 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 Uveitis 03/21/2017  . Change in vision 03/21/2017  . Microalbuminuria due to type 2 diabetes mellitus ([place]) 12/31/2016  . Vitamin D deficiency 12/31/2016  . Low level of high density lipoprotein (HDL) 12/30/2016  . Hypertriglyceridemia 12/30/2016  . Family history of colon cancer in father- in 4's 11/26/2016  . External hemorrhoids without complication 50/53/9767  . Environmental and seasonal allergies 07/30/2016  . Dyslipidemia with low high density lipoprotein (HDL) cholesterol with hypertriglyceridemia due to type 2 diabetes mellitus ([place]) 07/23/2016  . Diabetic nephropathy associated with type 2 diabetes mellitus: Microalbuminuria 07/23/2016  . CAD (coronary artery disease) 07/16/2011  . Dyspnea 05/10/2011  . Abnormal chest CT 04/12/2011  . HTN (hypertension)   . Anxiety 01/29/2011  . Mixed diabetic hyperlipidemia associated with type 2 diabetes mellitus ([place]) 07/11/2010  . PULMONARY NODULE 02/21/2010  . ORBITAL FLOOR , CLOSED FRACTURE 02/21/2010  . UNSPECIFIED VISUAL LOSS 12/26/2009  . TRANSIENT GLOBAL AMNESIA 12/26/2009  . DEPRESSION/ANXIETY 06/27/2009  . SHOULDER STRAIN 06/13/2009  . Controlled type 2 diabetes mellitus without complication, without long-term current use of insulin ([place]) 02/07/2009  . Hypothyroidism 04/24/2008  . Hypertension associated with diabetes ([place]) 04/12/2008  . FATIGUE 04/12/2008  . METHICILLIN SUSCEPTIBLE STAPH INF CCE & UNS SITE 10/14/2007  . Human immunodeficiency virus (HIV) disease ([place]) 10/14/2007  . CANDIDIASIS OF MOUTH 10/14/2007  . PNEUMOCYSTIS PNEUMONIA  10/14/2007  . h/o COCAINE ABUSE, EPISODIC 10/14/2007  . CUTANEOUS ERUPTIONS, DRUG-INDUCED 10/14/2007  . CANNABIS ABUSE, HX OF 10/14/2007     Past Medical History:  Diagnosis Date  . AIDS ([place]) 07/12/2014  . Anxiety   . Arthritis    "hips, shoulders" (03/21/2017)  . Bilateral bunions    left foot worse  . Coronary artery disease involving native coronary artery 01/12/2015  . Diabetes mellitus type 2, controlled, without complications ([place]) 34/03/9377  . Diabetes type  2, controlled ([place]) 07/12/2014  . High triglycerides   . HTN (hypertension)   . Human immunodeficiency virus (HIV) disease ([place]) d'x 09/2007  . Hyperlipidemia   . Hypothyroid   . 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 BRONCHOSCOPY  09/29/2007   Bronchoalveolar lavage was  obtained from the left lower lobe.  Under fluoroscopy, transbronchial Archie Endo 07/04/2010  . CONDYLOMA EXCISION/FULGURATION  ~ 1996   anal/notes 07/04/2010     Family History  Problem Relation Age of Onset  . Colon cancer Father   . Leukemia Maternal Grandfather      Social History   Substance and Sexual Activity  Drug Use Yes  . Types: Marijuana, Cocaine   Comment: 03/21/2017 "no cocaine for a few years; I smoke marijuana qd"  ,  Social History   Substance and Sexual Activity  Alcohol Use Yes   Comment: 03/21/2017 "none since 01/12/2017"  ,  Social History   Tobacco Use  Smoking Status Never Smoker  Smokeless Tobacco Never Used  ,    Current Outpatient Medications on File Prior to Visit  Medication Sig Dispense Refill  . ALPRAZolam (XANAX) 0.5 MG tablet TAKE 1 TABLET BY MOUTH 3 TIMES A DAY AS NEEDED FOR ANXIETY 90 tablet 0  . bictegravir-emtricitabine-tenofovir AF (BIKTARVY) 50-200-25 MG TABS tablet Take 1 tablet by mouth daily. 30 tablet 11  . cetirizine (ZYRTEC) 10 MG tablet TAKE 1 TABLET (10 MG TOTAL) BY MOUTH DAILY. 90 tablet 2  .  Cholecalciferol (VITAMIN D3) 5000 units CAPS Take 1 capsule by mouth.    . [facility] LANCETS ORIGINAL MISC 1 application by Does not apply route daily. 90 each 4  . gemfibrozil (LOPID) 600 MG tablet 1/2 tab twice daily 90 tablet 1  . glucose blood (ACCU-CHEK AVIVA) test strip Use as instructed 100 each 12  . hydrochlorothiazide (MICROZIDE) 12.5 MG capsule TAKE 1 CAPSULE BY MOUTH EVERY DAY 90 capsule 0  . levothyroxine (SYNTHROID, LEVOTHROID) 25 MCG tablet TAKE 1 TABLET BY MOUTH EVERY DAY ON AN EMPTY STOMACH 90 tablet 0  . lisinopril (PRINIVIL,ZESTRIL) 40 MG tablet TAKE 1 TABLET BY MOUTH EVERY DAY 90 tablet 3  . metFORMIN (GLUCOPHAGE) 500 MG tablet Take 1 tablet (500 mg total) by mouth 2 (two) times daily with a meal. 180 tablet 0  . Multiple Vitamins-Minerals (ICAPS AREDS 2 PO) Take by mouth.    . pravastatin (PRAVACHOL) 20 MG tablet TAKE 1 TABLET BY MOUTH EVERYDAY AT BEDTIME 90 tablet 0  . prednisoLONE acetate (PRED FORTE) 1 % ophthalmic suspension Place 1 drop into the right eye 4 (four) times daily. 10 mL 0  . tetrahydrozoline 0.05 % ophthalmic solution Place 1 drop into both eyes as needed (dry eyes).    . zinc gluconate 50 MG tablet Take 50 mg by mouth daily.     No current facility-administered medications on file prior to visit.      Allergies  Allergen Reactions  . Sulfamethoxazole-Trimethoprim     REACTION: severe rash     Review of Systems:   General:  Denies fever, chills Optho/Auditory:   Denies visual changes, blurred vision Respiratory:   Denies SOB, cough, wheeze, DIB  Cardiovascular:   Denies chest pain, palpitations, painful respirations Gastrointestinal:   Denies nausea, vomiting, diarrhea.  Endocrine:     Denies new hot or cold intolerance Musculoskeletal:  Denies joint swelling, gait issues, or new unexplained myalgias/ arthralgias Skin:  Denies rash, suspicious lesions  Neurological:    Denies dizziness, unexplained weakness, numbness  Psychiatric/Behavioral:    Denies mood changes  Objective:    There were no vitals taken for this visit.  There is no height or weight on file to calculate BMI.  General: Well Developed, well nourished, and in no acute distress.  HEENT: Normocephalic, atraumatic, pupils equal round reactive to light, neck supple, No carotid bruits, no JVD Skin: Warm and dry, cap RF less 2 sec Cardiac: Regular rate and rhythm, S1, S2 WNL's, no murmurs rubs or gallops Respiratory: ECTA B/L, Not using accessory muscles, speaking in full sentences. NeuroM-Sk: Ambulates w/o assistance, moves ext * 4 w/o difficulty, sensation grossly intact.  Ext: scant edema b/l lower ext Psych: No HI/SI, judgement and insight good, Euthymic mood. Full Affect.

## 2017-04-16 ENCOUNTER — Ambulatory Visit (INDEPENDENT_AMBULATORY_CARE_PROVIDER_SITE_OTHER): Payer: Medicare Other | Admitting: Ophthalmology

## 2017-04-16 ENCOUNTER — Encounter (INDEPENDENT_AMBULATORY_CARE_PROVIDER_SITE_OTHER): Payer: Self-pay | Admitting: Ophthalmology

## 2017-04-16 DIAGNOSIS — B20 Human immunodeficiency virus [HIV] disease: Secondary | ICD-10-CM | POA: Diagnosis not present

## 2017-04-16 DIAGNOSIS — A5271 Late syphilitic oculopathy: Secondary | ICD-10-CM | POA: Diagnosis not present

## 2017-04-16 DIAGNOSIS — H25813 Combined forms of age-related cataract, bilateral: Secondary | ICD-10-CM

## 2017-04-16 DIAGNOSIS — H3091 Unspecified chorioretinal inflammation, right eye: Secondary | ICD-10-CM | POA: Diagnosis not present

## 2017-04-16 DIAGNOSIS — H3581 Retinal edema: Secondary | ICD-10-CM | POA: Diagnosis not present

## 2017-04-16 DIAGNOSIS — Z21 Asymptomatic human immunodeficiency virus [HIV] infection status: Secondary | ICD-10-CM

## 2017-04-16 DIAGNOSIS — E113393 Type 2 diabetes mellitus with moderate nonproliferative diabetic retinopathy without macular edema, bilateral: Secondary | ICD-10-CM

## 2017-04-16 DIAGNOSIS — H469 Unspecified optic neuritis: Secondary | ICD-10-CM

## 2017-04-17 ENCOUNTER — Encounter (INDEPENDENT_AMBULATORY_CARE_PROVIDER_SITE_OTHER): Payer: Medicare Other | Admitting: Ophthalmology

## 2017-04-24 ENCOUNTER — Telehealth: Payer: Self-pay | Admitting: *Deleted

## 2017-04-24 NOTE — Telephone Encounter (Signed)
That is very unlikely to be a useful or important medicine and if someone told him to stop it he def should

## 2017-04-24 NOTE — Telephone Encounter (Signed)
Patient called to advise that when he was in the hospital 03/23/17 at discharge he was todl to stop taking Osteobiflex  and he wants to know if that was to be forever or temporary. Advised not sure but will ask the doctor and call him back.

## 2017-04-29 ENCOUNTER — Inpatient Hospital Stay: Payer: Self-pay | Admitting: Infectious Disease

## 2017-04-29 NOTE — Telephone Encounter (Signed)
Per Dr Daiva [place] patient told to not take the medication as it is not essential.

## 2017-05-13 ENCOUNTER — Other Ambulatory Visit: Payer: Self-pay | Admitting: Infectious Disease

## 2017-05-13 DIAGNOSIS — F419 Anxiety disorder, unspecified: Secondary | ICD-10-CM

## 2017-05-13 NOTE — Progress Notes (Signed)
[facility] - Clinic Note  05/14/2017     CHIEF COMPLAINT Patient presents for Retina Follow Up   HISTORY OF PRESENT ILLNESS: Jonathan Estes is a 62 y.o. male who presents to the clinic today for:   HPI    Retina Follow Up    Patient presents with  Other.  In right eye.  This started 3 months ago.  Severity is mild.  Duration of 4 weeks.  Since onset it is stable.  I, the attending physician,  performed the HPI with the patient and updated documentation appropriately.          Comments    F/U Ocular syphilis - Chorioretinitis, posterior uveitis OD; Pt states OD VA is stable; Pt states OU are comfortable; Pt denies floaters, denies flashes, denies wavy VA; Pt states he is pleased with treatment and happy VA has improved;        Last edited by Rennis Chris, MD on 05/14/2017 11:00 AM. (History)      Referring physician:  [name], DO [address] [place], [place] [address]  HISTORICAL INFORMATION:   Selected notes from the MEDICAL RECORD NUMBER Referred by Dr. Lenox Ahr for concern of optic neuritis, wet AMD, macular separation;  LEE- 01.15.19 (S.[name]) [BCVA OD: 20/100 OS: 20/30] Ocular Hx- glaucoma suspect, APD OD PMH- type II DM, elevated cholesterol, HTN, HIV positive    CURRENT MEDICATIONS: Current Outpatient Medications (Ophthalmic Drugs)  Medication Sig  . prednisoLONE acetate (PRED FORTE) 1 % ophthalmic suspension Place 1 drop into the right eye 4 (four) times daily.   No current facility-administered medications for this visit.  (Ophthalmic Drugs)   Current Outpatient Medications (Other)  Medication Sig  . ALPRAZolam (XANAX) 0.5 MG tablet TAKE 1 TABLET BY MOUTH 3 TIMES A DAY AS NEEDED FOR ANXIETY  . bictegravir-emtricitabine-tenofovir AF (BIKTARVY) 50-200-25 MG TABS tablet Take 1 tablet by mouth daily.  . cetirizine (ZYRTEC) 10 MG tablet TAKE 1 TABLET (10 MG TOTAL) BY MOUTH DAILY.  Marland Kitchen Cholecalciferol (VITAMIN D3) 5000  units CAPS Take 1 capsule (5,000 Units total) by mouth daily.  . CVS LANCETS ORIGINAL MISC 1 application by Does not apply route daily.  Marland Kitchen gemfibrozil (LOPID) 600 MG tablet 1/2 tab twice daily  . glucose blood (ACCU-CHEK AVIVA) test strip Use as instructed  . hydrochlorothiazide (MICROZIDE) 12.5 MG capsule Take 1 capsule (12.5 mg total) by mouth daily.  Marland Kitchen levothyroxine (SYNTHROID, LEVOTHROID) 25 MCG tablet TAKE 1 TABLET BY MOUTH EVERY DAY ON AN EMPTY STOMACH  . lisinopril (PRINIVIL,ZESTRIL) 40 MG tablet Take 1 tablet (40 mg total) by mouth daily.  . metFORMIN (GLUCOPHAGE) 500 MG tablet Take 1 tablet (500 mg total) by mouth 2 (two) times daily with a meal.  . Multiple Vitamins-Minerals (ICAPS AREDS 2 PO) Take by mouth.  . pravastatin (PRAVACHOL) 20 MG tablet TAKE 1 TABLET BY MOUTH EVERYDAY AT BEDTIME  . zinc gluconate 50 MG tablet Take 50 mg by mouth daily.   No current facility-administered medications for this visit.  (Other)      REVIEW OF SYSTEMS: ROS    Positive for: Eyes, Heme/Lymph   Negative for: Constitutional, Gastrointestinal, Neurological, Skin, Genitourinary, Musculoskeletal, HENT, Endocrine, Cardiovascular, Respiratory, Psychiatric, Allergic/Imm   Last edited by Concepcion Elk on 05/14/2017  9:41 AM. (History)       ALLERGIES Allergies  Allergen Reactions  . Sulfamethoxazole-Trimethoprim     REACTION: severe rash    PAST MEDICAL HISTORY Past Medical History:  Diagnosis Date  . AIDS (HCC) 07/12/2014  . Anxiety   . Arthritis    "hips, shoulders" (03/21/2017)  . Bilateral bunions    left foot worse  . Coronary artery disease involving native coronary artery 01/12/2015  . Diabetes mellitus type 2, controlled, without complications (HCC) 01/12/2015  . Diabetes type 2, controlled (HCC) 07/12/2014  . High triglycerides   . HTN (hypertension)   . Human immunodeficiency virus (HIV) disease (HCC) d'x 09/2007  . Hyperlipidemia   . Hypothyroid   . Ocular syphilis  04/04/2017  . 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 BRONCHOSCOPY  09/29/2007   Bronchoalveolar lavage was  obtained from the left lower lobe.  Under fluoroscopy, transbronchial Hattie Perch 07/04/2010  . CONDYLOMA EXCISION/FULGURATION  ~ 1996   anal/notes 07/04/2010    FAMILY HISTORY Family History  Problem Relation Age of Onset  . Colon cancer Father   . Leukemia Maternal Grandfather     SOCIAL HISTORY Social History   Tobacco Use  . Smoking status: Never Smoker  . Smokeless tobacco: Never Used  Substance Use Topics  . Alcohol use: Yes    Comment: 03/21/2017 "none since 01/12/2017"  . Drug use: Yes    Types: Marijuana, Cocaine    Comment: 03/21/2017 "no cocaine for a few years; I smoke marijuana qd"         OPHTHALMIC EXAM:  Base Eye Exam    Visual Acuity (Snellen - Linear)      Right Left   Dist [place] 20/20 20/20   Dist ph  20/15 20/15       Tonometry (Tonopen, 9:48 AM)      Right Left   Pressure 14 14       Pupils      Dark Light Shape React APD   Right 5 4 Round Slow +1   Left 3 2 Round Brisk None       Visual Fields (Counting fingers)      Left Right    Full Full       Extraocular Movement      Right Left    Full, Ortho Full, Ortho       Neuro/Psych    Oriented x3:  Yes   Mood/Affect:  Normal       Dilation    Both eyes:  1.0% Mydriacyl, 2.5% Phenylephrine @ 9:48 AM        Slit Lamp and Fundus Exam    Slit Lamp Exam      Right Left   Lids/Lashes UL Telangiectasia, Meibomian gland dysfunction UL Telangiectasia, Meibomian gland dysfunction   Conjunctiva/Sclera White and quiet White and quiet   Cornea mild Arcus, otherwise clear mild Arcus, otherwise clear   Anterior Chamber Deep and quiet deep, no cell   Iris Round and dilated to 6.5 mm Round and dilated to 6.5 mm   Lens 2+ Nuclear sclerosis, 2+ Cortical cataract 2+ Nuclear sclerosis, 2+ Cortical cataract   Vitreous Vitreous  syneresis Vitreous syneresis       Fundus Exam      Right Left   Disc pink, sharp pink, sharp   C/D Ratio 0.15 0.2   Macula Good foveal reflex, trace Retinal pigment epithelial atrophy and mottling, No heme or edema Good foveal reflex, mild Retinal pigment epithelial mottling, No heme or edema   Vessels Mild Copper wiring Mild Copper wiring   Periphery Attached; subtle RPE atrophy from macula extending nasal to disc-- resolved;  mild RPE changes/clumping inferior mid-zone Attached;           IMAGING AND PROCEDURES  Imaging and Procedures for 05/14/17  OCT, Retina - OU - Both Eyes     Right Eye Quality was good. Central Foveal Thickness: 254. Progression has been stable. Findings include normal foveal contour, no IRF, no SRF (Patchy ellipsoid disruption stable to slightly improved ).   Left Eye Quality was good. Central Foveal Thickness: 254. Progression has been stable. Findings include normal foveal contour, no IRF, no SRF.   Notes *Images captured and stored on drive  Diagnosis / Impression:  OD: NFP, no IRF/SRF; outer retinal atrophy and ellipsoid disruption stable to slightly improved OS: NFP; no IRF/SRF  Clinical management:  See below  Abbreviations: NFP - Normal foveal profile. CME - cystoid macular edema. PED - pigment epithelial detachment. IRF - intraretinal fluid. SRF - subretinal fluid. EZ - ellipsoid zone. ERM - epiretinal membrane. ORA - outer retinal atrophy. ORT - outer retinal tubulation. 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 HIV (human immunodeficiency virus infection) (HCC) B20   5. 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 Moderate nonproliferative diabetic retinopathy of both eyes without macular edema associated with type 2 diabetes mellitus (HCC) Z61.0960   7. Retinal edema H35.81 OCT,  Retina - OU - Both Eyes  8. Combined forms of age-related cataract of both eyes H25.813     1,2,3. Ocular syphilis - Chorioretinitis, posterior uveitis OD  - pt reports onset of symptoms (decreased vision) upon waking on Saturday 03/16/17 - presented on 03/20/17 with VA decreased to 20/50 and focal area of RPE and outer retinal atrophy from fovea extending nasally across disc - OCT and fundus autofluorescence with focal outer retinal atrophy corresponding to exam - FA showed window defect in area of atrophy + late leakage temporal to fovea and hyperfluorescence of the disc - case discussed with Infectious Disease call physician, Dr. Judyann Munson - labs positive for RPR, VDRL, and FTA-Ab - acute syphilitic posterior placoid chorioretinopathy (ASPPC) - pt reports trunk, palmar and plantar rashes in July 2018, but had a negative RPR - possible false negative in setting of HIV - was admitted to [facility] and started IV PCN on 03/21/17 - discharged on 03/23/17 with PICC line for continued IV PCN at home -- completed 14 days of antibiotic therapy - today, BCVA 20/15 OU and OCT shows further reconstitution of ellipsoid zone -- excellent response to IV PCN therapy  - mild AC cell resolved -- can begin PF taper -- 3,2,1 drops daily, decrease weekly - F/U 2-3 months  4. HIV infection - diagnosed 20+ years ago - contracted through unprotected sex with women and men - has been on antiretrovirals for ~10 yrs - CD4 count >600, 10/2017 - HIV RNA undetectable, 10/2017 - management per ID service  5. Optic neuropathy OD - originally, had 2+ rAPD OD -- mild residual rAPD present by tech exam today - FA: + hyperfluorescence of disc consistent with inflammation - retinal atrophy surrounding disc improving  6. Moderate nonproliferative diabetic retinopathy w/o DME, both eyes - The incidence, risk factors for progression, natural history and treatment options for diabetic retinopathy were discussed with  patient.   - The need  for close monitoring of blood glucose, blood pressure, and serum lipids, avoiding cigarette or any type of tobacco, and the need for long term follow up was also discussed with patient. - exam with mild scattered MAs - FA with scattered MAs, no NV - OCT without diabetic macular edema, both eyes  - f/u in 2-3 mos  7. No retinal edema on exam or OCT  8. Combined forms of age-related cataract OU-  - The symptoms of cataract, surgical options, and treatments and risks were discussed with patient. - discussed diagnosis and progression - not yet visually significant - monitor for now   Ophthalmic Meds Ordered this visit:  No orders of the defined types were placed in this encounter.      Return in about 3 months (around 08/14/2017) for F/U Ocular syphilis OD.  There are no Patient Instructions on file for this visit.   Explained the diagnoses, plan, and follow up with the patient and they expressed understanding.  Patient expressed understanding of the importance of proper follow up care.   This document serves as a record of services personally performed by Karie Chimera, MD, PhD. It was created on their behalf by Virgilio Belling, COA, a certified ophthalmic assistant. The creation of this record is the provider's dictation and/or activities during the visit.  Electronically signed by: Virgilio Belling, COA  05/14/17 1:23 PM   Karie Chimera, M.D., Ph.D. Diseases & Surgery of the Retina and Vitreous [facility] 05/14/17   I have reviewed the above documentation for accuracy and completeness, and I agree with the above. Karie Chimera, M.D., Ph.D. 05/14/17 1:27 PM    Abbreviations: M myopia (nearsighted); A astigmatism; H hyperopia (farsighted); P presbyopia; Mrx spectacle prescription;  CTL contact lenses; OD right eye; OS left eye; OU both eyes  XT exotropia; ET esotropia; PEK punctate epithelial keratitis; PEE punctate  epithelial erosions; DES dry eye syndrome; MGD meibomian gland dysfunction; ATs artificial tears; PFAT's preservative free artificial tears; NSC nuclear sclerotic cataract; PSC posterior subcapsular cataract; ERM epi-retinal membrane; PVD posterior vitreous detachment; RD retinal detachment; DM diabetes mellitus; DR diabetic retinopathy; NPDR non-proliferative diabetic retinopathy; PDR proliferative diabetic retinopathy; CSME clinically significant macular edema; DME diabetic macular edema; dbh dot blot hemorrhages; CWS cotton wool spot; POAG primary open angle glaucoma; C/D cup-to-disc ratio; HVF humphrey visual field; GVF goldmann visual field; OCT optical coherence tomography; IOP intraocular pressure; BRVO Branch retinal vein occlusion; CRVO central retinal vein occlusion; CRAO central retinal artery occlusion; BRAO branch retinal artery occlusion; RT retinal tear; SB scleral buckle; PPV pars plana vitrectomy; VH Vitreous hemorrhage; PRP panretinal laser photocoagulation; IVK intravitreal kenalog; VMT vitreomacular traction; MH Macular hole;  NVD neovascularization of the disc; NVE neovascularization elsewhere; AREDS age related eye disease study; ARMD age related macular degeneration; POAG primary open angle glaucoma; EBMD epithelial/anterior basement membrane dystrophy; ACIOL anterior chamber intraocular lens; IOL intraocular lens; PCIOL posterior chamber intraocular lens; Phaco/IOL phacoemulsification with intraocular lens placement; PRK photorefractive keratectomy; LASIK laser assisted in situ keratomileusis; HTN hypertension; DM diabetes mellitus; COPD chronic obstructive pulmonary disease

## 2017-05-14 ENCOUNTER — Ambulatory Visit (INDEPENDENT_AMBULATORY_CARE_PROVIDER_SITE_OTHER): Payer: Medicare Other | Admitting: Ophthalmology

## 2017-05-14 ENCOUNTER — Encounter (INDEPENDENT_AMBULATORY_CARE_PROVIDER_SITE_OTHER): Payer: Self-pay | Admitting: Ophthalmology

## 2017-05-14 DIAGNOSIS — A5271 Late syphilitic oculopathy: Secondary | ICD-10-CM | POA: Diagnosis not present

## 2017-05-14 DIAGNOSIS — H469 Unspecified optic neuritis: Secondary | ICD-10-CM

## 2017-05-14 DIAGNOSIS — H3091 Unspecified chorioretinal inflammation, right eye: Secondary | ICD-10-CM | POA: Diagnosis not present

## 2017-05-14 DIAGNOSIS — H3581 Retinal edema: Secondary | ICD-10-CM | POA: Diagnosis not present

## 2017-05-14 DIAGNOSIS — B20 Human immunodeficiency virus [HIV] disease: Secondary | ICD-10-CM

## 2017-05-14 DIAGNOSIS — E113393 Type 2 diabetes mellitus with moderate nonproliferative diabetic retinopathy without macular edema, bilateral: Secondary | ICD-10-CM

## 2017-05-14 DIAGNOSIS — H25813 Combined forms of age-related cataract, bilateral: Secondary | ICD-10-CM

## 2017-05-19 ENCOUNTER — Encounter: Payer: Self-pay | Admitting: Gastroenterology

## 2017-06-22 ENCOUNTER — Other Ambulatory Visit: Payer: Self-pay | Admitting: Infectious Disease

## 2017-06-22 DIAGNOSIS — F419 Anxiety disorder, unspecified: Secondary | ICD-10-CM

## 2017-06-26 ENCOUNTER — Other Ambulatory Visit: Payer: Self-pay | Admitting: Behavioral Health

## 2017-06-26 DIAGNOSIS — F419 Anxiety disorder, unspecified: Secondary | ICD-10-CM

## 2017-06-26 MED ORDER — ALPRAZOLAM 0.5 MG PO TABS: ORAL_TABLET | ORAL | 0 refills | Status: DC

## 2017-07-23 ENCOUNTER — Encounter (INDEPENDENT_AMBULATORY_CARE_PROVIDER_SITE_OTHER): Payer: Self-pay | Admitting: *Deleted

## 2017-07-23 VITALS — BP 118/80 | HR 61 | Temp 97.6°F | Wt 198.5 lb

## 2017-07-23 DIAGNOSIS — Z006 Encounter for examination for normal comparison and control in clinical research program: Secondary | ICD-10-CM

## 2017-07-23 DIAGNOSIS — E119 Type 2 diabetes mellitus without complications: Secondary | ICD-10-CM | POA: Diagnosis not present

## 2017-07-23 LAB — HIV-1 RNA QUANT-NO REFLEX-BLD
CD4 COUNT: 904
CD4%: 25.1
CD8 % Suppressor T Cell: 46.8
CD8 T Cell Abs: 1685
HIV-1 RNA Viral Load: <40

## 2017-07-23 NOTE — Progress Notes (Signed)
Jonathan Estes was here for study visits for [id number] and [phone]. He was recently diagnosed with ocular syphilis and has been treated for it and says his vision has gotten better. He denies any other new problems.  He has started taking osteobiflex and vitamins for his eyes and feels like that has been helping him. He will be returning in  November for the next study visits.

## 2017-07-24 LAB — HEMOGLOBIN A1C
EAG (MMOL/L): 6.2 (calc)
Hgb A1c MFr Bld: 5.5 % of total Hgb (ref <5.7)
MEAN PLASMA GLUCOSE: 111 (calc)

## 2017-07-24 LAB — PROTEIN / CREATININE RATIO, URINE
Creatinine, Urine: 48 mg/dL (ref 20–320)
Protein/Creat Ratio: 167 mg/g creat — ABNORMAL HIGH (ref 22–128)
Total Protein, Urine: 8 mg/dL (ref 5–25)

## 2017-07-24 LAB — COMPREHENSIVE METABOLIC PANEL
AG RATIO: 1.5 (calc) (ref 1.0–2.5)
ALT: 11 U/L (ref 9–46)
AST: 17 U/L (ref 10–35)
Albumin: 4.7 g/dL (ref 3.6–5.1)
Alkaline phosphatase (APISO): 93 U/L (ref 40–115)
BUN/Creatinine Ratio: 15 (calc) (ref 6–22)
BUN: 22 mg/dL (ref 7–25)
CALCIUM: 9.8 mg/dL (ref 8.6–10.3)
CO2: 29 mmol/L (ref 20–32)
Chloride: 101 mmol/L (ref 98–110)
Creat: 1.42 mg/dL — ABNORMAL HIGH (ref 0.70–1.33)
Globulin: 3.2 g/dL (calc) (ref 1.9–3.7)
Glucose, Bld: 101 mg/dL — ABNORMAL HIGH (ref 65–99)
Potassium: 3.8 mmol/L (ref 3.5–5.3)
SODIUM: 138 mmol/L (ref 135–146)
TOTAL PROTEIN: 7.9 g/dL (ref 6.1–8.1)
Total Bilirubin: 0.5 mg/dL (ref 0.2–1.2)

## 2017-07-24 LAB — LIPID PANEL
Cholesterol: 152 mg/dL (ref <200)
HDL: 45 mg/dL (ref >40)
LDL Cholesterol (Calc): 83 mg/dL (calc)
Non-HDL Cholesterol (Calc): 107 mg/dL (calc) (ref <130)
TRIGLYCERIDES: 143 mg/dL (ref <150)
Total CHOL/HDL Ratio: 3.4 (calc) (ref <5.0)

## 2017-07-24 LAB — HEPATITIS B SURFACE ANTIBODY,QUALITATIVE: Hep B S Ab: REACTIVE — AB

## 2017-07-24 LAB — HEPATITIS B SURFACE ANTIGEN: HEP B S AG: NONREACTIVE

## 2017-07-24 LAB — HEPATITIS C ANTIBODY
Hepatitis C Ab: NONREACTIVE
SIGNAL TO CUT-OFF: 0.07 (ref <1.00)

## 2017-07-30 ENCOUNTER — Other Ambulatory Visit: Payer: Self-pay

## 2017-07-30 ENCOUNTER — Other Ambulatory Visit: Payer: Self-pay | Admitting: Infectious Disease

## 2017-07-30 DIAGNOSIS — F419 Anxiety disorder, unspecified: Secondary | ICD-10-CM

## 2017-08-09 ENCOUNTER — Encounter: Payer: Self-pay | Admitting: *Deleted

## 2017-08-12 ENCOUNTER — Ambulatory Visit (INDEPENDENT_AMBULATORY_CARE_PROVIDER_SITE_OTHER): Payer: Medicare Other | Admitting: Infectious Disease

## 2017-08-12 ENCOUNTER — Encounter: Payer: Self-pay | Admitting: Infectious Disease

## 2017-08-12 VITALS — BP 116/75 | HR 66 | Temp 97.6°F | Ht 68.0 in | Wt 200.0 lb

## 2017-08-12 DIAGNOSIS — A5271 Late syphilitic oculopathy: Secondary | ICD-10-CM | POA: Diagnosis not present

## 2017-08-12 DIAGNOSIS — B20 Human immunodeficiency virus [HIV] disease: Secondary | ICD-10-CM | POA: Diagnosis not present

## 2017-08-12 DIAGNOSIS — I10 Essential (primary) hypertension: Secondary | ICD-10-CM | POA: Diagnosis not present

## 2017-08-12 DIAGNOSIS — F341 Dysthymic disorder: Secondary | ICD-10-CM

## 2017-08-12 DIAGNOSIS — E038 Other specified hypothyroidism: Secondary | ICD-10-CM

## 2017-08-12 DIAGNOSIS — N179 Acute kidney failure, unspecified: Secondary | ICD-10-CM

## 2017-08-12 DIAGNOSIS — Z23 Encounter for immunization: Secondary | ICD-10-CM | POA: Diagnosis not present

## 2017-08-12 DIAGNOSIS — E1169 Type 2 diabetes mellitus with other specified complication: Secondary | ICD-10-CM | POA: Diagnosis not present

## 2017-08-12 DIAGNOSIS — E119 Type 2 diabetes mellitus without complications: Secondary | ICD-10-CM | POA: Diagnosis not present

## 2017-08-12 DIAGNOSIS — E782 Mixed hyperlipidemia: Secondary | ICD-10-CM

## 2017-08-12 HISTORY — DX: Acute kidney failure, unspecified: N17.9

## 2017-08-12 LAB — BASIC METABOLIC PANEL WITH GFR
BUN: 16 mg/dL (ref 7–25)
CO2: 29 mmol/L (ref 20–32)
Calcium: 9.9 mg/dL (ref 8.6–10.3)
Chloride: 100 mmol/L (ref 98–110)
Creat: 1.07 mg/dL (ref 0.70–1.33)
GFR, EST AFRICAN AMERICAN: 88 mL/min/{1.73_m2} (ref >=60)
GFR, Est Non African American: 76 mL/min/{1.73_m2} (ref >=60)
Glucose, Bld: 90 mg/dL (ref 65–99)
Potassium: 4.3 mmol/L (ref 3.5–5.3)
SODIUM: 136 mmol/L (ref 135–146)

## 2017-08-12 NOTE — Addendum Note (Signed)
Addended by: Gildardo GriffesHILL, [place] Stell M on: 08/12/2017 10:36 AM   Modules accepted: Orders

## 2017-08-12 NOTE — Progress Notes (Signed)
"chief complaint: follow-up for HIV and recently diagnosed ocular syphilis and AKI Subjective:    Patient ID: Jonathan Estes, male    DOB: 08/05/1955, 62 y.o.   MRN: [id number]  HPI  Jonathan Estes is a 62 y.o. male who is doing superbly well on their antiviral regimen, now on BIKTARVY.   Lab Results  Component Value Date   HIV1RNAQUANT 26 (H) 03/21/2017   HIV1RNAQUANT <40 12/01/2015   HIV1RNAQUANT <40 12/01/2015   Lab Results  Component Value Date   CD4TABS 440 03/21/2017   CD4TABS 641 01/12/2015   CD4TABS 670 05/25/2014    He was  diagnosed with ocular syphilis in the winter and finished 2 weeks of IV PCN but not had repeat RPR here  ON lab testing w research his Cr had bumped up to 1.4 which could be partly due to [facility] but I was concerned for other causes. He endorsed having mowed lawns vigorously and having lost 6 # weight at the time.   Past Medical History:  Diagnosis Date  . AIDS (HCC) 07/12/2014  . Anxiety   . Arthritis    "hips, shoulders" (03/21/2017)  . Bilateral bunions    left foot worse  . Coronary artery disease involving native coronary artery 01/12/2015  . Diabetes mellitus type 2, controlled, without complications (HCC) 01/12/2015  . Diabetes type 2, controlled (HCC) 07/12/2014  . High triglycerides   . HTN (hypertension)   . Human immunodeficiency virus (HIV) disease (HCC) d'x 09/2007  . Hyperlipidemia   . Hypothyroid   . 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 BRONCHOSCOPY  09/29/2007   Bronchoalveolar lavage was  obtained from the left lower lobe.  Under fluoroscopy, transbronchial Hattie Perch 07/04/2010  . CONDYLOMA EXCISION/FULGURATION  ~ 1996   anal/notes 07/04/2010    Family History  Problem Relation Age of Onset  . Colon cancer Father   . Leukemia Maternal Grandfather       Social History   Socioeconomic History  . Marital status: Single   Spouse name: Not on file  . Number of children: Not on file  . Years of education: Not on file  . Highest education level: Not on file  Occupational History  . Not on file  Social Needs  . Financial resource strain: Not on file  . Food insecurity:    Worry: Not on file    Inability: Not on file  . Transportation needs:    Medical: Not on file    Non-medical: Not on file  Tobacco Use  . Smoking status: Never Smoker  . Smokeless tobacco: Never Used  Substance and Sexual Activity  . Alcohol use: Yes    Comment: 03/21/2017 "none since 01/12/2017"  . Drug use: Yes    Types: Marijuana, Cocaine    Comment: 03/21/2017 "no cocaine for a few years; I smoke marijuana qd"  . Sexual activity: Never    Partners: Female  Lifestyle  . Physical activity:    Days per week: Not on file    Minutes per session: Not on file  . Stress: Not on file  Relationships  . Social connections:    Talks on phone: Not on file    Gets together: Not on file    Attends religious service: Not on file    Active member of club or organization: Not on file    Attends meetings of clubs or organizations: Not on file  Relationship status: Not on file  Other Topics Concern  . Not on file  Social History Narrative   Lives alone.          Allergies  Allergen Reactions  . Sulfamethoxazole-Trimethoprim     REACTION: severe rash     Current Outpatient Medications:  .  ALPRAZolam (XANAX) 0.5 MG tablet, TAKE 1 TABLET BY MOUTH THREE TIMES A DAY, Disp: 90 tablet, Rfl: 1 .  bictegravir-emtricitabine-tenofovir AF (BIKTARVY) 50-200-25 MG TABS tablet, Take 1 tablet by mouth daily., Disp: 30 tablet, Rfl: 11 .  cetirizine (ZYRTEC) 10 MG tablet, TAKE 1 TABLET (10 MG TOTAL) BY MOUTH DAILY., Disp: 90 tablet, Rfl: 2 .  Cholecalciferol (VITAMIN D3) 5000 units CAPS, Take 1 capsule (5,000 Units total) by mouth daily., Disp: 120 capsule, Rfl: 4 .  [employer]S LANCETS ORIGINAL MISC, 1 application by Does not apply route daily., Disp:  90 each, Rfl: 4 .  gemfibrozil (LOPID) 600 MG tablet, 1/2 tab twice daily, Disp: 90 tablet, Rfl: 1 .  glucose blood (ACCU-CHEK AVIVA) test strip, Use as instructed, Disp: 100 each, Rfl: 12 .  hydrochlorothiazide (MICROZIDE) 12.5 MG capsule, Take 1 capsule (12.5 mg total) by mouth daily., Disp: 90 capsule, Rfl: 1 .  levothyroxine (SYNTHROID, LEVOTHROID) 25 MCG tablet, TAKE 1 TABLET BY MOUTH EVERY DAY ON AN EMPTY STOMACH, Disp: 90 tablet, Rfl: 1 .  lisinopril (PRINIVIL,ZESTRIL) 40 MG tablet, Take 1 tablet (40 mg total) by mouth daily., Disp: 90 tablet, Rfl: 1 .  metFORMIN (GLUCOPHAGE) 500 MG tablet, Take 1 tablet (500 mg total) by mouth 2 (two) times daily with a meal., Disp: 180 tablet, Rfl: 1 .  Multiple Vitamins-Minerals (ICAPS AREDS 2 PO), Take by mouth., Disp: , Rfl:  .  pravastatin (PRAVACHOL) 20 MG tablet, TAKE 1 TABLET BY MOUTH EVERYDAY AT BEDTIME, Disp: 90 tablet, Rfl: 1 .  prednisoLONE acetate (PRED FORTE) 1 % ophthalmic suspension, Place 1 drop into the right eye 4 (four) times daily., Disp: 10 mL, Rfl: 0 .  zinc gluconate 50 MG tablet, Take 50 mg by mouth daily., Disp: , Rfl:     Review of Systems  Constitutional: Negative for activity change, appetite change, chills, diaphoresis, fatigue, fever and unexpected weight change.  HENT: Negative for congestion, rhinorrhea, sinus pressure, sneezing, sore throat and trouble swallowing.   Eyes: Negative for photophobia and visual disturbance.  Respiratory: Negative for cough, chest tightness, shortness of breath, wheezing and stridor.   Cardiovascular: Negative for chest pain, palpitations and leg swelling.  Gastrointestinal: Negative for abdominal distention, abdominal pain, anal bleeding, blood in stool, constipation, diarrhea, nausea and vomiting.  Genitourinary: Negative for difficulty urinating, dysuria, flank pain and hematuria.  Musculoskeletal: Negative for arthralgias, back pain, gait problem, joint swelling and myalgias.  Skin:  Negative for color change, pallor, rash and wound.  Neurological: Negative for dizziness, tremors, weakness and light-headedness.  Hematological: Negative for adenopathy. Does not bruise/bleed easily.  Psychiatric/Behavioral: Negative for agitation, behavioral problems, confusion, decreased concentration, dysphoric mood and sleep disturbance.       Objective:   Physical Exam  Constitutional: He is oriented to person, place, and time. He appears well-developed and well-nourished. No distress.  HENT:  Head: Normocephalic and atraumatic.  Mouth/Throat: Oropharynx is clear and moist. No oropharyngeal exudate.  Eyes: Pupils are equal, round, and reactive to light. Conjunctivae and EOM are normal. No scleral icterus.  Neck: Normal range of motion. Neck supple. No JVD present.  Cardiovascular: Normal rate, regular rhythm and normal heart sounds.  Pulmonary/Chest: Effort normal. No respiratory distress. He has no wheezes.  Abdominal: He exhibits no distension.  Musculoskeletal: He exhibits no edema or tenderness.  Lymphadenopathy:    He has no cervical adenopathy.  Neurological: He is alert and oriented to person, place, and time. He has normal reflexes. He exhibits normal muscle tone. Coordination normal.  Skin: Skin is warm and dry. He is not diaphoretic. No erythema. No pallor.  Psychiatric: He has a normal mood and affect. His behavior is normal. Judgment and thought content normal.  Nursing note and vitals reviewed.         Assessment & Plan:   AKI: creatinine was up. I ordered repeat BMP w GFR, urine anaylsis, sodium, creatinine, microglobulin to creatinine ratio  Ocular syphilis: will check titer today. He is seeing optho in 2 days ZOX:[id number]:continue BIKTARVY, RTC in 1 year  Hypothyroidism: continue synthroid which is being prescribed by primary care physician  DM: very good control, last A1c was 5 range on only metfomin 500mg  bid. Does he even need it anymore?  HTN: BP followed by  PCP There were no vitals filed for this visit.   CAD: seen on CT angio. No need for new interventions, no ssx, on statin, Asa  Hyperlipidemia: on statin  Anxiety , generalized: continue prn xanax  I spent greater than 25 minutes with the patient including greater than 50% of time in face to face counsel of the patient re his ARV regimen, his recent bump in creatinine, and differential causes for this and his DM and A1c and in coordination of his care.

## 2017-08-13 ENCOUNTER — Ambulatory Visit (INDEPENDENT_AMBULATORY_CARE_PROVIDER_SITE_OTHER): Payer: Medicare Other | Admitting: Family Medicine

## 2017-08-13 VITALS — BP 113/72 | HR 73 | Ht 69.0 in | Wt 197.0 lb

## 2017-08-13 DIAGNOSIS — E1129 Type 2 diabetes mellitus with other diabetic kidney complication: Secondary | ICD-10-CM | POA: Diagnosis not present

## 2017-08-13 DIAGNOSIS — E1169 Type 2 diabetes mellitus with other specified complication: Secondary | ICD-10-CM | POA: Diagnosis not present

## 2017-08-13 DIAGNOSIS — I10 Essential (primary) hypertension: Secondary | ICD-10-CM

## 2017-08-13 DIAGNOSIS — E1121 Type 2 diabetes mellitus with diabetic nephropathy: Secondary | ICD-10-CM | POA: Diagnosis not present

## 2017-08-13 DIAGNOSIS — B20 Human immunodeficiency virus [HIV] disease: Secondary | ICD-10-CM

## 2017-08-13 DIAGNOSIS — E1159 Type 2 diabetes mellitus with other circulatory complications: Secondary | ICD-10-CM | POA: Diagnosis not present

## 2017-08-13 DIAGNOSIS — E119 Type 2 diabetes mellitus without complications: Secondary | ICD-10-CM | POA: Diagnosis not present

## 2017-08-13 DIAGNOSIS — E782 Mixed hyperlipidemia: Secondary | ICD-10-CM

## 2017-08-13 DIAGNOSIS — E038 Other specified hypothyroidism: Secondary | ICD-10-CM

## 2017-08-13 DIAGNOSIS — E559 Vitamin D deficiency, unspecified: Secondary | ICD-10-CM

## 2017-08-13 DIAGNOSIS — R809 Proteinuria, unspecified: Secondary | ICD-10-CM

## 2017-08-13 LAB — URINALYSIS, ROUTINE W REFLEX MICROSCOPIC
Bilirubin Urine: NEGATIVE
GLUCOSE, UA: NEGATIVE
Hgb urine dipstick: NEGATIVE
Ketones, ur: NEGATIVE
LEUKOCYTES UA: NEGATIVE
Nitrite: NEGATIVE
PROTEIN: NEGATIVE
Specific Gravity, Urine: 1.005 (ref 1.001–1.03)
pH: 7 (ref 5.0–8.0)

## 2017-08-13 LAB — MICROALBUMIN / CREATININE URINE RATIO: Creatinine, Urine: 14 mg/dL — ABNORMAL LOW (ref 20–320)

## 2017-08-13 LAB — SODIUM, URINE, RANDOM: SODIUM UR: 43 mmol/L (ref 28–272)

## 2017-08-13 MED ORDER — METFORMIN HCL 500 MG PO TABS: 250.0000 mg | ORAL_TABLET | Freq: Two times a day (BID) | ORAL | 1 refills | Status: DC

## 2017-08-13 NOTE — Progress Notes (Signed)
Assessment and plan:  1. Mixed diabetic hyperlipidemia associated with type 2 diabetes mellitus ([place])   2. Microalbuminuria due to type 2 diabetes mellitus ([place])   3. Hypertension associated with diabetes ([place])   4. Dyslipidemia with low high density lipoprotein (HDL) cholesterol with hypertriglyceridemia due to type 2 diabetes mellitus ([place])   5. Diabetic nephropathy associated with type 2 diabetes mellitus: Microalbuminuria   6. Other specified hypothyroidism   7. Human immunodeficiency virus (HIV) disease ([place])   8. Vitamin D deficiency   9. Controlled type 2 diabetes mellitus without complication, without long-term current use of insulin ([place])     Recent labs reviewed in detail with the patient today (07/23/2017; outside labs).  1.) Cholesterol - Much improved and stable at this time. Continue medications as prescribed.  - Triglycerides = reduced to 143 from 253 (10 months ago). - HDL = increased to 45 from 15 (10 months ago). - LDL = 83  2.) Hepatitis B Antibody (Ordered Outside of Clinic) - Per patient, he received an immunization for Hepatitis.  - Encouraged patient to continue to follow up with his infectious disease specialist about his Hepatitis B immunization/lab result.   3.) A1c = 5.5 10 months ago was 6.0; higher prior. 4 months ago = 5.7.  4.) Diabetes - Stable at this time. - Dose of metformin reduced to 250 every 12 hours, reduced from 500 BID.   - Will continue to monitor closely.  - Counseled patient on pathophysiology of disease and discussed various treatment options, which often includes dietary and lifestyle modifications as first line.  Importance of low carb/ketogenic diet discussed with patient in addition to regular exercise.   - Check FBS and 2 hours after the biggest meal of your day.  Keep log and bring in next OV for my review.   Also, if you ever feel poorly, please check your  blood pressure and blood sugar, as one or the other could be the cause of your symptoms.  - Being a diabetic, you need yearly eye and foot exams. Make appt.for diabetic eye exam.  5.) Diabetic Nephropathy & Microalbuminuria - Discussed recent serum creatinine of 1.42 after being <1 in the past. - In BMP 1 day ago, serum creatinine reduced to 1.07.  - Encouraged patient to drink more water, exercise, control blood pressure, and control blood sugar to manage kidney health.   6.) Hypertension - Stable at this time.  No changes made to medication.  Will continue to monitor.  - Lifestyle changes such as dash diet and engaging in a regular exercise program discussed with patient.  Educational handouts provided  - Ambulatory BP monitoring encouraged. Keep log and bring in next OV  - Continue current medication(s).   Also, risks and benefits of medications discussed with patient, including alternative treatments.   Encouraged patient to read drug information handouts to further educate self about the medicine prior to starting it.   - Contact us prior with any Q's/ concerns.  7.) HIV  - Per patient, stable at this time.  - Patient will continue to be managed by infectious disease  8.) Health Maintenance Explained to patient what BMI refers to, and what it means medically.    Told patient to think about it as a "medical risk stratification measurement" and how increasing BMI is associated with increasing risk/ or worsening state of various diseases such as hypertension, hyperlipidemia, diabetes, premature OA, depression etc.  American Heart Association guidelines for  healthy diet, basically Mediterranean diet, and exercise guidelines of 30 minutes 5 days per week or more discussed in detail.  Health counseling performed.  All questions answered.  - Advised patient to continue working toward exercising to improve health.    - PT will begin with 15 minutes of activity daily. Recommended that  the patient eventually strive for at least 150 minutes of cardio per week according to the [facility].   - Healthy dietary habits encouraged, including low-carb, and high amounts of lean protein in diet.  Eat beef or pork only once or twice weekly.  - Patient should also consume adequate amounts of water - half of body weight in oz of water per day.  Advised patient to drink more water when he's been outside doing yard work, mowing, or exercising.    - Given the patient's history of kidney damage, hydration strongly emphasized.   Education and routine counseling performed. Handouts provided.  9.) Follow-Up - Check TSH in 11/2017.  - Return in 4 months unless something changes.  Patient knows to monitor for concerns.   No orders of the defined types were placed in this encounter.   Meds ordered this encounter  Medications  . metFORMIN (GLUCOPHAGE) 500 MG tablet    Sig: Take 0.5 tablets (250 mg total) by mouth 2 (two) times daily with a meal.    Dispense:  180 tablet    Refill:  1     Return in about 4 months (around 12/13/2017) for Follow-up 4 months recheck A1c, TSH T4 and vitamin D..   Anticipatory guidance and routine counseling done re: condition, txmnt options and need for follow up. All questions of patient's were answered.   Gross side effects, risk and benefits, and alternatives of medications discussed with patient.  Patient is aware that all medications have potential side effects and we are unable to predict every sideeffect or drug-drug interaction that may occur.  Expresses verbal understanding and consents to current therapy plan and treatment regiment.  Please see AVS handed out to patient at the end of our visit for additional patient instructions/ counseling done pertaining to today's office visit.  Note: This document was prepared using Dragon voice recognition software and may include unintentional dictation errors.  This document serves as a record of services  personally performed by Mellody Dance, DO. It was created on her behalf by Toni Amend, a trained medical scribe. The creation of this record is based on the scribe's personal observations and the provider's statements to them.   I have reviewed the above medical documentation for accuracy and completeness and I concur.  Mellody Dance 08/13/17 1:21 PM   ----------------------------------------------------------------------------------------------------------------------  Subjective:   CC:   Jonathan Estes is a 62 y.o. male who presents to [place] at [facility] today for review and discussion of recent bloodwork that was done.  1. All recent blood work that we ordered was reviewed with patient today.  Patient was counseled on all abnormalities and we discussed dietary and lifestyle changes that could help those values (also medications when appropriate).  Extensive health counseling performed and all patient's concerns/ questions were addressed.   Patient notes that he's been well since last visit 04/15/2017.  States that he's been "eating out of the [place]" lately (this is where he procures groceries).  HIV Patient notes that he believes "everything's alright." Continues to follow up with infectious disease specialist.  DM HPI:  -  He has not been working  on diet and exercise for diabetes, though patient notes that he does work in the yard for 2 hours weekly.  Notes that he recently discussed quitting metformin, or at least "stepping down."  A1c 10 months ago was 6.0, and higher prior. 4 months ago, A1c was 5.7, and now 5.5.  Pt is currently maintained on the following medications for diabetes:   see med list today  Medication compliance - Patient is compliant with metformin as prescribed.  Patient does not check his blood glucose at home.  Diabetic Nephropathy/Microalbuminuria Patient does not believe he's made any changes to his treatment to  affect his kidneys.  He believes his kidneys are affected by "all these pills I'm taking."  Denies polyuria/polydipsia.  Notes that he has been trying to drink more water lately on purpose.  Denies hypo/ hyperglycemia symptoms  Last diabetic eye exam was  Lab Results  Component Value Date   HMDIABEYEEXA No Retinopathy 11/29/2016    Foot exam- UTD  Last A1C in the office was:  Lab Results  Component Value Date   HGBA1C 5.5 07/23/2017   HGBA1C 5.7 04/15/2017   HGBA1C 6.0 (H) 10/04/2016    Lab Results  Component Value Date   MICROALBUR <0.2 08/12/2017   LDLCALC 83 07/23/2017   CREATININE 1.07 08/12/2017    Wt Readings from Last 3 Encounters:  08/12/17 200 lb (90.7 kg)  07/23/17 198 lb 8 oz (90 kg)  04/15/17 192 lb (87.1 kg)    BP Readings from Last 3 Encounters:  08/12/17 116/75  07/23/17 118/80  04/15/17 120/77    1. HTN HPI:  -  His blood pressure may or may not be controlled at home.  Pt is not checking his blood pressure at home.  - Patient reports good compliance with blood pressure medications.  States that he sometimes uses "herb" to assist with his blood pressure; has been doing this for 40 years.  - Patient has a history of use of cocaine.  - Continues on lisinopril and HCTZ.  Denies medication S-E.   - Smoking Status noted   - He denies new onset of: chest pain, exercise intolerance, shortness of breath, dizziness, visual changes, headache, lower extremity swelling or claudication.   Last 3 blood pressure readings in our office are as follows: BP Readings from Last 3 Encounters:  08/12/17 116/75  07/23/17 118/80  04/15/17 120/77    There were no vitals filed for this visit.   Wt Readings from Last 3 Encounters:  08/12/17 200 lb (90.7 kg)  07/23/17 198 lb 8 oz (90 kg)  04/15/17 192 lb (87.1 kg)   BP Readings from Last 3 Encounters:  08/12/17 116/75  07/23/17 118/80  04/15/17 120/77   Pulse Readings from Last 3 Encounters:  08/12/17 66    07/23/17 61  04/15/17 67   BMI Readings from Last 3 Encounters:  08/12/17 30.41 kg/m  07/23/17 29.31 kg/m  04/15/17 28.35 kg/m     Patient Care Team    Relationship Specialty Notifications Start End  Mellody Dance, DO PCP - General Family Medicine  11/26/16   Inda Castle, MD (Inactive) Consulting Physician Gastroenterology  07/23/16   Tommy Medal, Lavell Islam, MD Consulting Physician Infectious Diseases  12/31/16    Comment: HIV doc  Beverely Low, [place] Referring Physician Optometry  12/31/16     Full medical history updated and reviewed in the office today  Patient Active Problem List   Diagnosis Date Noted  . Microalbuminuria due to type  2 diabetes mellitus ([place]) 12/31/2016    Priority: High  . Dyslipidemia with low high density lipoprotein (HDL) cholesterol with hypertriglyceridemia due to type 2 diabetes mellitus ([place]) 07/23/2016    Priority: High  . Diabetic nephropathy associated with type 2 diabetes mellitus: Microalbuminuria 07/23/2016    Priority: High  . Mixed diabetic hyperlipidemia associated with type 2 diabetes mellitus ([place]) 07/11/2010    Priority: High  . Controlled type 2 diabetes mellitus without complication, without long-term current use of insulin ([place]) 02/07/2009    Priority: High  . Hypertension associated with diabetes ([place]) 04/12/2008    Priority: High  . Family history of colon cancer in father- in 28's 11/26/2016    Priority: Medium  . CAD (coronary artery disease) 07/16/2011    Priority: Medium  . Hypothyroidism 04/24/2008    Priority: Medium  . Human immunodeficiency virus (HIV) disease ([place]) 10/14/2007    Priority: Medium  . Uveitis 03/21/2017    Priority: Low  . Vitamin D deficiency 12/31/2016    Priority: Low  . Environmental and seasonal allergies 07/30/2016    Priority: Low  . Anxiety 01/29/2011    Priority: Low  . DEPRESSION/ANXIETY 06/27/2009    Priority: Low  . h/o COCAINE ABUSE, EPISODIC 10/14/2007    Priority: Low  .  CANNABIS ABUSE, HX OF 10/14/2007    Priority: Low  . AKI (acute kidney injury) ([place]) 08/12/2017  . 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 Change in vision 03/21/2017  . Low level of high density lipoprotein (HDL) 12/30/2016  . Hypertriglyceridemia 12/30/2016  . External hemorrhoids without complication 27/05/5007  . Dyspnea 05/10/2011  . Abnormal chest CT 04/12/2011  . HTN (hypertension)   . PULMONARY NODULE 02/21/2010  . ORBITAL FLOOR , CLOSED FRACTURE 02/21/2010  . UNSPECIFIED VISUAL LOSS 12/26/2009  . TRANSIENT GLOBAL AMNESIA 12/26/2009  . SHOULDER STRAIN 06/13/2009  . FATIGUE 04/12/2008  . METHICILLIN SUSCEPTIBLE STAPH INF CCE & UNS SITE 10/14/2007  . CANDIDIASIS OF MOUTH 10/14/2007  . PNEUMOCYSTIS PNEUMONIA 10/14/2007  . CUTANEOUS ERUPTIONS, DRUG-INDUCED 10/14/2007    Past Medical History:  Diagnosis Date  . AIDS ([place]) 07/12/2014  . AKI (acute kidney injury) ([place]) 08/12/2017  . Anxiety   . Arthritis    "hips, shoulders" (03/21/2017)  . Bilateral bunions    left foot worse  . Coronary artery disease involving native coronary artery 01/12/2015  . Diabetes mellitus type 2, controlled, without complications ([place]) 38/03/8297  . Diabetes type 2, controlled ([place]) 07/12/2014  . High triglycerides   . HTN (hypertension)   . Human immunodeficiency virus (HIV) disease ([place]) d'x 09/2007  . Hyperlipidemia   . Hypothyroid   . 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 BRONCHOSCOPY  09/29/2007   Bronchoalveolar lavage was  obtained from the left lower lobe.  Under fluoroscopy, transbronchial Archie Endo 07/04/2010  . CONDYLOMA EXCISION/FULGURATION  ~ 1996   anal/notes 07/04/2010    Social History   Tobacco Use  . Smoking status: Never Smoker  . Smokeless tobacco: Never Used  Substance Use Topics  . Alcohol use: Yes    Comment: 03/21/2017 "none since 01/12/2017"    Family Hx: Family History  Problem  Relation Age of Onset  . Colon cancer Father   . Leukemia Maternal Grandfather      Medications: Current Outpatient Medications  Medication Sig Dispense Refill  . ALPRAZolam (XANAX) 0.5 MG tablet TAKE 1 TABLET BY MOUTH THREE TIMES A DAY  90 tablet 1  . bictegravir-emtricitabine-tenofovir AF (BIKTARVY) 50-200-25 MG TABS tablet Take 1 tablet by mouth daily. 30 tablet 11  . cetirizine (ZYRTEC) 10 MG tablet TAKE 1 TABLET (10 MG TOTAL) BY MOUTH DAILY. 90 tablet 2  . Cholecalciferol (VITAMIN D3) 5000 units CAPS Take 1 capsule (5,000 Units total) by mouth daily. 120 capsule 4  . [facility] LANCETS ORIGINAL MISC 1 application by Does not apply route daily. 90 each 4  . gemfibrozil (LOPID) 600 MG tablet 1/2 tab twice daily 90 tablet 1  . glucose blood (ACCU-CHEK AVIVA) test strip Use as instructed 100 each 12  . hydrochlorothiazide (MICROZIDE) 12.5 MG capsule Take 1 capsule (12.5 mg total) by mouth daily. 90 capsule 1  . levothyroxine (SYNTHROID, LEVOTHROID) 25 MCG tablet TAKE 1 TABLET BY MOUTH EVERY DAY ON AN EMPTY STOMACH 90 tablet 1  . lisinopril (PRINIVIL,ZESTRIL) 40 MG tablet Take 1 tablet (40 mg total) by mouth daily. 90 tablet 1  . metFORMIN (GLUCOPHAGE) 500 MG tablet Take 0.5 tablets (250 mg total) by mouth 2 (two) times daily with a meal. 180 tablet 1  . Misc Natural Products (OSTEO BI-FLEX JOINT SHIELD PO) Take 1 tablet by mouth daily.    . Multiple Vitamins-Minerals (ICAPS AREDS 2 PO) Take by mouth.    . pravastatin (PRAVACHOL) 20 MG tablet TAKE 1 TABLET BY MOUTH EVERYDAY AT BEDTIME 90 tablet 1  . zinc gluconate 50 MG tablet Take 50 mg by mouth daily.     No current facility-administered medications for this visit.     Allergies:  Allergies  Allergen Reactions  . Sulfamethoxazole-Trimethoprim     REACTION: severe rash     Review of Systems: General:   No F/C, wt loss Pulm:   No DIB, SOB, pleuritic chest pain Card:  No CP, palpitations Abd:  No n/v/d or pain Ext:  No inc edema from  baseline  Objective:  There were no vitals taken for this visit. There is no height or weight on file to calculate BMI. Gen:   Well NAD, A and O *3 HEENT:    [place]/AT, EOMI,  MMM Lungs:   Normal work of breathing. CTA B/L, no Wh, rhonchi Heart:   RRR, S1, S2 WNL's, no MRG Abd:   No gross distention Exts:    warm, pink,  Brisk capillary refill, warm and well perfused.  Psych:    No HI/SI, judgement and insight good, Euthymic mood. Full Affect.   Recent Results (from the past 2160 hour(s))  HIV 1 RNA quant-no reflex-bld     Status: None   Collection Time: 07/23/17 12:00 AM  Result Value Ref Range   HIV-1 RNA Viral Load <40    CD8 T Cell Abs 1,685    CD8 % Suppressor T Cell 46.8    CD4% 25.1    CD4 Count 904   Comp Met (CMET)     Status: Abnormal   Collection Time: 07/23/17  9:50 AM  Result Value Ref Range   Glucose, Bld 101 (H) 65 - 99 mg/dL    Comment: .            Fasting reference interval . For someone without known diabetes, a glucose value between 100 and 125 mg/dL is consistent with prediabetes and should be confirmed with a follow-up test. .    BUN 22 7 - 25 mg/dL   Creat 1.42 (H) 0.70 - 1.33 mg/dL    Comment: For patients >74 years of age, the reference limit for  Creatinine is approximately 13% higher for people identified as African-American. .    BUN/Creatinine Ratio 15 6 - 22 (calc)   Sodium 138 135 - 146 mmol/L   Potassium 3.8 3.5 - 5.3 mmol/L   Chloride 101 98 - 110 mmol/L   CO2 29 20 - 32 mmol/L   Calcium 9.8 8.6 - 10.3 mg/dL   Total Protein 7.9 6.1 - 8.1 g/dL   Albumin 4.7 3.6 - 5.1 g/dL   Globulin 3.2 1.9 - 3.7 g/dL (calc)   AG Ratio 1.5 1.0 - 2.5 (calc)   Total Bilirubin 0.5 0.2 - 1.2 mg/dL   Alkaline phosphatase (APISO) 93 40 - 115 U/L   AST 17 10 - 35 U/L   ALT 11 9 - 46 U/L  Hepatitis C Antibody     Status: None   Collection Time: 07/23/17  9:50 AM  Result Value Ref Range   Hepatitis C Ab NON-REACTIVE NON-REACTI   SIGNAL TO CUT-OFF 0.07  <1.00    Comment: . HCV antibody was non-reactive. There is no laboratory  evidence of HCV infection. . In most cases, no further action is required. However, if recent HCV exposure is suspected, a test for HCV RNA (test code 954 [id number]) is suggested. . For additional information please refer to http://education.quest[facility].com/faq/FAQ22v1 (This link is being provided for informational/ educational purposes only.) .   Lipid panel     Status: None   Collection Time: 07/23/17  9:50 AM  Result Value Ref Range   Cholesterol 152 <200 mg/dL   HDL 45 >40 mg/dL   Triglycerides 143 <150 mg/dL   LDL Cholesterol (Calc) 83 mg/dL (calc)    Comment: Reference range: <100 . Desirable range <100 mg/dL for primary prevention;   <70 mg/dL for patients with CHD or diabetic patients  with > or = 2 CHD risk factors. Marland Kitchen LDL-C is now calculated using the Martin-Hopkins  calculation, which is a validated novel method providing  better accuracy than the Friedewald equation in the  estimation of LDL-C.  Cresenciano Genre et al. Annamaria Helling. 1700;174(94): 2061-2068  (http://education.[facility].com/faq/FAQ164)    Total CHOL/HDL Ratio 3.4 <5.0 (calc)   Non-HDL Cholesterol (Calc) 107 <130 mg/dL (calc)    Comment: For patients with diabetes plus 1 major ASCVD risk  factor, treating to a non-HDL-C goal of <100 mg/dL  (LDL-C of <70 mg/dL) is considered a therapeutic  option.   HgB A1c     Status: None   Collection Time: 07/23/17  9:50 AM  Result Value Ref Range   Hgb A1c MFr Bld 5.5 <5.7 % of total Hgb    Comment: For the purpose of screening for the presence of diabetes: . <5.7%       Consistent with the absence of diabetes 5.7-6.4%    Consistent with increased risk for diabetes             (prediabetes) > or =6.5%  Consistent with diabetes . This assay result is consistent with a decreased risk of diabetes. . Currently, no consensus exists regarding use of hemoglobin A1c for diagnosis of diabetes  in children. . According to American Diabetes Association (ADA) guidelines, hemoglobin A1c <7.0% represents optimal control in non-pregnant diabetic patients. Different metrics may apply to specific patient populations.  Standards of Medical Care in Diabetes(ADA). .    Mean Plasma Glucose 111 (calc)   eAG (mmol/L) 6.2 (calc)  Hepatitis B surface antigen     Status: None   Collection Time: 07/23/17  9:50 AM  Result Value  Ref Range   Hepatitis B Surface Ag NON-REACTIVE NON-REACTI  Hepatitis B Surface AntiBODY     Status: Abnormal   Collection Time: 07/23/17  9:50 AM  Result Value Ref Range   Hep B S Ab REACTIVE (A) NON-REACTI  Protein / creatinine ratio, urine     Status: Abnormal   Collection Time: 07/23/17 10:30 AM  Result Value Ref Range   Creatinine, Urine 48 20 - 320 mg/dL   Protein/Creat Ratio 167 (H) 22 - 128 mg/g creat   Total Protein, Urine 8 5 - 25 mg/dL  BASIC METABOLIC PANEL WITH GFR     Status: None   Collection Time: 08/12/17  9:33 AM  Result Value Ref Range   Glucose, Bld 90 65 - 99 mg/dL    Comment: .            Fasting reference interval .    BUN 16 7 - 25 mg/dL   Creat 1.07 0.70 - 1.33 mg/dL    Comment: For patients >37 years of age, the reference limit for Creatinine is approximately 13% higher for people identified as African-American. .    GFR, Est Non African American 76 > OR = 60 mL/min/1.37m   GFR, Est African American 88 > OR = 60 mL/min/1.726m  BUN/Creatinine Ratio NOT APPLICABLE 6 - 22 (calc)   Sodium 136 135 - 146 mmol/L   Potassium 4.3 3.5 - 5.3 mmol/L   Chloride 100 98 - 110 mmol/L   CO2 29 20 - 32 mmol/L   Calcium 9.9 8.6 - 10.3 mg/dL  Urinalysis, Routine w reflex microscopic     Status: None   Collection Time: 08/12/17 10:31 AM  Result Value Ref Range   Color, Urine YELLOW YELLOW   APPearance CLEAR CLEAR   Specific Gravity, Urine 1.005 1.001 - 1.03   pH 7.0 5.0 - 8.0   Glucose, UA NEGATIVE NEGATIVE   Bilirubin Urine NEGATIVE  NEGATIVE   Ketones, ur NEGATIVE NEGATIVE   Hgb urine dipstick NEGATIVE NEGATIVE   Protein, ur NEGATIVE NEGATIVE   Nitrite NEGATIVE NEGATIVE   Leukocytes, UA NEGATIVE NEGATIVE  Microalbumin / creatinine urine ratio     Status: Abnormal   Collection Time: 08/12/17 10:31 AM  Result Value Ref Range   Creatinine, Urine 14 (L) 20 - 320 mg/dL   Microalb, Ur <0.2 mg/dL    Comment: Reference Range Not established    Microalb Creat Ratio NOTE <30 mcg/mg creat    Comment: The microalbumin value is less than 0.2 mg/dL therefore we are unable to calculate excretion and/or creatinine ratio. . . The ADA defines abnormalities in albumin excretion as follows: . Marland Kitchenategory         Result (mcg/mg creatinine) . Normal                    <30 Microalbuminuria         30-299  Clinical albuminuria   > OR = 300 . The ADA recommends that at least two of three specimens collected within a 3-6 month period be abnormal before considering a patient to be within a diagnostic category.   Sodium, urine, random     Status: None   Collection Time: 08/12/17 10:31 AM  Result Value Ref Range   Sodium, Ur 43 28 - 272 mmol/L

## 2017-08-13 NOTE — Progress Notes (Signed)
[facility] - Clinic Note  08/14/2017     CHIEF COMPLAINT Patient presents for Retina Follow Up   HISTORY OF PRESENT ILLNESS: Jonathan Estes is a 62 y.o. male who presents to the clinic today for:   HPI    Retina Follow Up    Patient presents with  Other.  In right eye.  Severity is mild.  Duration of 3 months.  Since onset it is stable.  I, the attending physician,  performed the HPI with the patient and updated documentation appropriately.          Comments    F/U Ocular syphilis - chorioretinitis, posterior uveitis OD- Pt states Ou VA is stable; pt states he tapered off gtts; Pt states OU are comfortable; Pt denies any floaters, denies flashes, denies wavy VA;        Last edited by Rennis Chris, MD on 08/14/2017 11:12 AM. (History)      Referring physician:  [name], DO [address] [place], [place] [address]  HISTORICAL INFORMATION:   Selected notes from the MEDICAL RECORD NUMBER Referred by Dr. Lenox Ahr for concern of optic neuritis, wet AMD, macular separation;  LEE- 01.15.19 (S.[name]) [BCVA OD: 20/100 OS: 20/30] Ocular Hx- glaucoma suspect, APD OD PMH- type II DM, elevated cholesterol, HTN, HIV positive    CURRENT MEDICATIONS: No current outpatient medications on file. (Ophthalmic Drugs)   No current facility-administered medications for this visit.  (Ophthalmic Drugs)   Current Outpatient Medications (Other)  Medication Sig  . ALPRAZolam (XANAX) 0.5 MG tablet TAKE 1 TABLET BY MOUTH THREE TIMES A DAY  . bictegravir-emtricitabine-tenofovir AF (BIKTARVY) 50-200-25 MG TABS tablet Take 1 tablet by mouth daily.  . cetirizine (ZYRTEC) 10 MG tablet TAKE 1 TABLET (10 MG TOTAL) BY MOUTH DAILY.  Marland Kitchen Cholecalciferol (VITAMIN D3) 5000 units CAPS Take 1 capsule (5,000 Units total) by mouth daily.  . [employer] LANCETS ORIGINAL MISC 1 application by Does not apply route daily.  Marland Kitchen gemfibrozil (LOPID) 600 MG tablet 1/2 tab twice daily  .  glucose blood (ACCU-CHEK AVIVA) test strip Use as instructed  . hydrochlorothiazide (MICROZIDE) 12.5 MG capsule Take 1 capsule (12.5 mg total) by mouth daily.  Marland Kitchen levothyroxine (SYNTHROID, LEVOTHROID) 25 MCG tablet TAKE 1 TABLET BY MOUTH EVERY DAY ON AN EMPTY STOMACH  . lisinopril (PRINIVIL,ZESTRIL) 40 MG tablet Take 1 tablet (40 mg total) by mouth daily.  . metFORMIN (GLUCOPHAGE) 500 MG tablet Take 0.5 tablets (250 mg total) by mouth 2 (two) times daily with a meal.  . Misc Natural Products (OSTEO BI-FLEX JOINT SHIELD PO) Take 1 tablet by mouth daily.  . Multiple Vitamins-Minerals (ICAPS AREDS 2 PO) Take by mouth.  . pravastatin (PRAVACHOL) 20 MG tablet TAKE 1 TABLET BY MOUTH EVERYDAY AT BEDTIME  . zinc gluconate 50 MG tablet Take 50 mg by mouth daily.   No current facility-administered medications for this visit.  (Other)      REVIEW OF SYSTEMS: ROS    Positive for: Cardiovascular, Eyes, Respiratory, Heme/Lymph   Negative for: Constitutional, Gastrointestinal, Neurological, Skin, Genitourinary, Musculoskeletal, HENT, Endocrine, Psychiatric, Allergic/Imm   Last edited by Concepcion Elk, COA on 08/14/2017  9:58 AM. (History)       ALLERGIES Allergies  Allergen Reactions  . Sulfamethoxazole-Trimethoprim     REACTION: severe rash    PAST MEDICAL HISTORY Past Medical History:  Diagnosis Date  . AIDS (HCC) 07/12/2014  . AKI (acute kidney injury) (HCC) 08/12/2017  . Anxiety   .  Arthritis    "hips, shoulders" (03/21/2017)  . Bilateral bunions    left foot worse  . Coronary artery disease involving native coronary artery 01/12/2015  . Diabetes mellitus type 2, controlled, without complications (HCC) 01/12/2015  . Diabetes type 2, controlled (HCC) 07/12/2014  . High triglycerides   . HTN (hypertension)   . Human immunodeficiency virus (HIV) disease (HCC) d'x 09/2007  . Hyperlipidemia   . Hypothyroid   . 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 BRONCHOSCOPY  09/29/2007   Bronchoalveolar lavage was  obtained from the left lower lobe.  Under fluoroscopy, transbronchial Hattie Perch 07/04/2010  . CONDYLOMA EXCISION/FULGURATION  ~ 1996   anal/notes 07/04/2010    FAMILY HISTORY Family History  Problem Relation Age of Onset  . Colon cancer Father   . Leukemia Maternal Grandfather     SOCIAL HISTORY Social History   Tobacco Use  . Smoking status: Never Smoker  . Smokeless tobacco: Never Used  Substance Use Topics  . Alcohol use: Yes    Comment: 03/21/2017 "none since 01/12/2017"  . Drug use: Yes    Types: Marijuana, Cocaine    Comment: 03/21/2017 "no cocaine for a few years; I smoke marijuana qd"         OPHTHALMIC EXAM:  Base Eye Exam    Visual Acuity (Snellen - Linear)      Right Left   Dist [place] 20/20 20/20   Dist ph  20/20 20/20       Tonometry (Tonopen, 10:02 AM)      Right Left   Pressure 15 15       Pupils      Dark Light Shape React APD   Right 5 3 Round Brisk None   Left 5 3 Round Brisk None       Visual Fields (Counting fingers)      Left Right    Full Full       Extraocular Movement      Right Left    Full, Ortho Full, Ortho       Neuro/Psych    Oriented x3:  Yes   Mood/Affect:  Normal       Dilation    Both eyes:  1.0% Mydriacyl, 2.5% Phenylephrine @ 10:02 AM        Slit Lamp and Fundus Exam    Slit Lamp Exam      Right Left   Lids/Lashes UL Telangiectasia, Meibomian gland dysfunction UL Telangiectasia, Meibomian gland dysfunction   Conjunctiva/Sclera White and quiet White and quiet   Cornea mild Arcus, otherwise clear mild Arcus, otherwise clear   Anterior Chamber Deep and quiet deep, no cell   Iris Round and dilated to 6.5 mm Round and dilated to 6.5 mm   Lens 2+ Nuclear sclerosis, 2+ Cortical cataract 2+ Nuclear sclerosis, 2+ Cortical cataract   Vitreous Vitreous syneresis Vitreous syneresis       Fundus Exam      Right  Left   Disc pink, sharp pink, sharp   C/D Ratio 0.15 0.2   Macula Good foveal reflex, trace Retinal pigment epithelial mottling, No heme or edema Good foveal reflex, mild Retinal pigment epithelial mottling, No heme or edema   Vessels Mild Copper wiring Mild Copper wiring   Periphery Attached; subtle RPE atrophy from macula extending nasal to disc-- resolved; mild RPE changes/clumping inferior mid-zone Attached;  IMAGING AND PROCEDURES  Imaging and Procedures for 05/14/17  OCT, Retina - OU - Both Eyes       Right Eye Quality was good. Central Foveal Thickness: 254. Progression has improved. Findings include normal foveal contour, no IRF, no SRF (Full reconstitution of ellipsoid).   Left Eye Quality was good. Central Foveal Thickness: 257. Progression has been stable. Findings include normal foveal contour, no IRF, no SRF.   Notes *Images captured and stored on drive  Diagnosis / Impression:  OD: NFP, no IRF/SRF; Full reconstitution of ellipsoid OS: NFP; no IRF/SRF  Clinical management:  See below  Abbreviations: NFP - Normal foveal profile. CME - cystoid macular edema. PED - pigment epithelial detachment. IRF - intraretinal fluid. SRF - subretinal fluid. EZ - ellipsoid zone. ERM - epiretinal membrane. ORA - outer retinal atrophy. ORT - outer retinal tubulation. 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 Asymptomatic HIV infection (HCC) Z21   5. 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 Retinal edema H35.81 OCT, Retina - OU - Both Eyes  8. Combined forms of age-related cataract of both eyes H25.813     1,2,3. Ocular syphilis - Chorioretinitis, posterior uveitis  OD  - pt reports onset of symptoms (decreased vision) upon waking on Saturday 03/16/17 - presented on 03/20/17 with VA decreased to 20/50 and focal area of RPE and outer retinal atrophy from fovea extending nasally across disc - initial OCT and fundus autofluorescence with focal outer retinal atrophy corresponding to exam - FA showed window defect in area of atrophy + late leakage temporal to fovea and hyperfluorescence of the disc - case discussed with Infectious Disease call physician, Dr. Judyann Munsonynthia Snider - labs positive for RPR, VDRL, and FTA-Ab - acute syphilitic posterior placoid chorioretinopathy (ASPPC) - pt reports trunk, palmar and plantar rashes in July 2018, but had a negative RPR - possible false negative in setting of HIV - [facility] and started IV PCN on 03/21/17 - discharged on 03/23/17 with PICC line for continued IV PCN at home -- completed 14 days of antibiotic therapy - today, BCVA 20/15 OU and OCT shows full reconstitution of ellipsoid zone -- excellent response to IV PCN therapy  - no AC cell following completion of PF taper - F/U 1 year  4. HIV infection - diagnosed 20+ years ago - contracted through unprotected sex with women and men - has been on antiretrovirals for ~10 yrs - CD4 count 904, 07/23/2017 - HIV RNA viral load <40, 07/23/2017 - management per ID service  5. Optic neuropathy OD - originally, had 2+ rAPD OD --  - no rAPD reported on tech exam prior to dilation - initial FA: + hyperfluorescence of disc consistent with inflammation - retinal atrophy surrounding disc improved  6. Moderate nonproliferative diabetic retinopathy w/o DME, both eyes - The incidence, risk factors for progression, natural history and treatment options for diabetic retinopathy were discussed with patient.   - The need for close monitoring of blood glucose, blood pressure, and serum lipids, avoiding cigarette or any type of tobacco, and the need for long term follow  up was  also discussed with patient. - exam with mild scattered MAs - initial FA with scattered MAs, no NV - OCT without diabetic macular edema, both eyes  - f/u in 1 year  7. No retinal edema on exam or OCT  8. Combined forms of age-related cataract OU-  - The symptoms of cataract, surgical options, and treatments and risks were discussed with patient. - discussed diagnosis and progression - not yet visually significant - monitor for now   Ophthalmic Meds Ordered this visit:  No orders of the defined types were placed in this encounter.      Return in about 1 year (around 08/15/2018) for F/U Ocular syphilis OD, DFE, OCT.  There are no Patient Instructions on file for this visit.   Explained the diagnoses, plan, and follow up with the patient and they expressed understanding.  Patient expressed understanding of the importance of proper follow up care.   This document serves as a record of services personally performed by Karie Chimera, MD, PhD. It was created on their behalf by Virgilio Belling, COA, a certified ophthalmic assistant. The creation of this record is the provider's dictation and/or activities during the visit.  Electronically signed by: Virgilio Belling, COA  06.11.19 10:41 PM   Karie Chimera, M.D., Ph.D. Diseases & Surgery of the Retina and Vitreous [facility]  I have reviewed the above documentation for accuracy and completeness, and I agree with the above. Karie Chimera, M.D., Ph.D. 08/18/17 10:48 PM    Abbreviations: M myopia (nearsighted); A astigmatism; H hyperopia (farsighted); P presbyopia; Mrx spectacle prescription;  CTL contact lenses; OD right eye; OS left eye; OU both eyes  XT exotropia; ET esotropia; PEK punctate epithelial keratitis; PEE punctate epithelial erosions; DES dry eye syndrome; MGD meibomian gland dysfunction; ATs artificial tears; PFAT's preservative free artificial tears; NSC nuclear sclerotic cataract; PSC  posterior subcapsular cataract; ERM epi-retinal membrane; PVD posterior vitreous detachment; RD retinal detachment; DM diabetes mellitus; DR diabetic retinopathy; NPDR non-proliferative diabetic retinopathy; PDR proliferative diabetic retinopathy; CSME clinically significant macular edema; DME diabetic macular edema; dbh dot blot hemorrhages; CWS cotton wool spot; POAG primary open angle glaucoma; C/D cup-to-disc ratio; HVF humphrey visual field; GVF goldmann visual field; OCT optical coherence tomography; IOP intraocular pressure; BRVO Branch retinal vein occlusion; CRVO central retinal vein occlusion; CRAO central retinal artery occlusion; BRAO branch retinal artery occlusion; RT retinal tear; SB scleral buckle; PPV pars plana vitrectomy; VH Vitreous hemorrhage; PRP panretinal laser photocoagulation; IVK intravitreal kenalog; VMT vitreomacular traction; MH Macular hole;  NVD neovascularization of the disc; NVE neovascularization elsewhere; AREDS age related eye disease study; ARMD age related macular degeneration; POAG primary open angle glaucoma; EBMD epithelial/anterior basement membrane dystrophy; ACIOL anterior chamber intraocular lens; IOL intraocular lens; PCIOL posterior chamber intraocular lens; Phaco/IOL phacoemulsification with intraocular lens placement; PRK photorefractive keratectomy; LASIK laser assisted in situ keratomileusis; HTN hypertension; DM diabetes mellitus; COPD chronic obstructive pulmonary disease

## 2017-08-13 NOTE — Patient Instructions (Addendum)
Jonathan Estes we are decreasing your metformin by half please take a half a tab of the 500 in the morning and half tab in the evening.  Please make sure you are drinking adequate amounts of water which is one half of your weight in ounces of water per day and if you are outside working please add a significant amount more. [name] please try to exercise to a goal of 150 minutes a week of cardiovascular activity and have healthier choices when you eat which are lower carb as we discussed.    Diabetes Mellitus and Standards of Medical Care  Managing diabetes (diabetes mellitus) can be complicated. Your diabetes treatment may be managed by a team of health care providers, including:  A diet and nutrition specialist (registered dietitian).  A nurse.  A certified diabetes educator (CDE).  A diabetes specialist (endocrinologist).  An eye doctor.  A primary care provider.  A dentist.  Your health care providers follow a schedule in order to help you get the best quality of care. The following schedule is a general guideline for your diabetes management plan. Your health care providers may also give you more specific instructions.  HbA1c (hemoglobin A1c) test This test provides information about blood sugar (glucose) control over the previous 2-3 months. It is used to check whether your diabetes management plan needs to be adjusted.  If you are meeting your treatment goals, this test is done at least 2 times a year.  If you are not meeting treatment goals or if your treatment goals have changed, this test is done 4 times a year.  Blood pressure test  This test is done at every routine medical visit. For most people, the goal is less than 130/80. Ask your health care provider what your goal blood pressure should be.  Dental and eye exams  Visit your dentist two times a year.  If you have type 1 diabetes, get an eye exam 3-5 years after you are diagnosed, and then once a year after your first  exam. ? If you were diagnosed with type 1 diabetes as a child, get an eye exam when you are age 24 or older and have had diabetes for 3-5 years. After the first exam, you should get an eye exam once a year.  If you have type 2 diabetes, have an eye exam as soon as you are diagnosed, and then once a year after your first exam.  Foot care exam  Visual foot exams are done at every routine medical visit. The exams check for cuts, bruises, redness, blisters, sores, or other problems with the feet.  A complete foot exam is done by your health care provider once a year. This exam includes an inspection of the structure and skin of your feet, and a check of the pulses and sensation in your feet. ? Type 1 diabetes: Get your first exam 3-5 years after diagnosis. ? Type 2 diabetes: Get your first exam as soon as you are diagnosed.  Check your feet every day for cuts, bruises, redness, blisters, or sores. If you have any of these or other problems that are not healing, contact your health care provider.  Kidney function test (urine microalbumin)  This test is done once a year. ? Type 1 diabetes: Get your first test 5 years after diagnosis. ? Type 2 diabetes: Get your first test as soon as you are diagnosed._  If you have chronic kidney disease (CKD), get a serum creatinine and estimated glomerular filtration  rate (eGFR) test once a year.  Lipid profile (cholesterol, HDL, LDL, triglycerides)  This test should be done when you are diagnosed with diabetes, and every 5 years after the first test. If you are on medicines to lower your cholesterol, you may need to get this test done every year. ? The goal for LDL is less than 100 mg/dL (5.5 mmol/L). If you are at high risk, the goal is less than 70 mg/dL (3.9 mmol/L). ? The goal for HDL is 40 mg/dL (2.2 mmol/L) for men and 50 mg/dL(2.8 mmol/L) for women. An HDL cholesterol of 60 mg/dL (3.3 mmol/L) or higher gives some protection against heart  disease. ? The goal for triglycerides is less than 150 mg/dL (8.3 mmol/L).  Immunizations  The yearly flu (influenza) vaccine is recommended for everyone 6 months or older who has diabetes.  The pneumonia (pneumococcal) vaccine is recommended for everyone 2 years or older who has diabetes. If you are 73 or older, you may get the pneumonia vaccine as a series of two separate shots.  The hepatitis B vaccine is recommended for adults shortly after they have been diagnosed with diabetes.  The Tdap (tetanus, diphtheria, and pertussis) vaccine should be given: ? According to normal childhood vaccination schedules, for children. ? Every 10 years, for adults who have diabetes.  The shingles vaccine is recommended for people who have had chicken pox and are 50 years or older.  Mental and emotional health  Screening for symptoms of eating disorders, anxiety, and depression is recommended at the time of diagnosis and afterward as needed. If your screening shows that you have symptoms (you have a positive screening result), you may need further evaluation and be referred to a mental health care provider.  Diabetes self-management education  Education about how to manage your diabetes is recommended at diagnosis and ongoing as needed.  Treatment plan  Your treatment plan will be reviewed at every medical visit.  Summary  Managing diabetes (diabetes mellitus) can be complicated. Your diabetes treatment may be managed by a team of health care providers.  Your health care providers follow a schedule in order to help you get the best quality of care.  Standards of care including having regular physical exams, blood tests, blood pressure monitoring, immunizations, screening tests, and education about how to manage your diabetes.  Your health care providers may also give you more specific instructions based on your individual health.      Type 2 Diabetes Mellitus, Self Care, Adult Caring for  yourself after you have been diagnosed with type 2 diabetes (type 2 diabetes mellitus) means keeping your blood sugar (glucose) under control with a balance of:  Nutrition.  Exercise.  Lifestyle changes.  Medicines or insulin, if necessary.  Support from your team of health care providers and others.  The following information explains what you need to know to manage your diabetes at home. What do I need to do to manage my blood glucose?  Check your blood glucose every day, as often as told by your health care provider.  Contact your health care provider if your blood glucose is above your target for 2 tests in a row.  Have your A1c (hemoglobin A1c) level checked at least two times a year, or as often as told by your health care provider. Your health care provider will set individualized treatment goals for you. Generally, the goal of treatment is to maintain the following blood glucose levels:  Before meals (preprandial): 80-130 mg/dL (4.4-7.2  mmol/L).  After meals (postprandial): below 180 mg/dL (10 mmol/L).  A1c level: less than 7%.  What do I need to know about hyperglycemia and hypoglycemia? What is hyperglycemia? Hyperglycemia, also called high blood glucose, occurs when blood glucose is too high.Make sure you know the early signs of hyperglycemia, such as:  Increased thirst.  Hunger.  Feeling very tired.  Needing to urinate more often than usual.  Blurry vision.  What is hypoglycemia? Hypoglycemia, also called low blood glucose, occurswith a blood glucose level at or below 70 mg/dL (3.9 mmol/L). The risk for hypoglycemia increases during or after exercise, during sleep, during illness, and when skipping meals or not eating for a long time (fasting). It is important to know the symptoms of hypoglycemia and treat it right away. Always have a 15-gram rapid-acting carbohydrate snack with you to treat low blood glucose. Family members and close friends should also know  the symptoms and should understand how to treat hypoglycemia, in case you are not able to treat yourself. What are the symptoms of hypoglycemia? Hypoglycemia symptoms can include:  Hunger.  Anxiety.  Sweating and feeling clammy.  Confusion.  Dizziness or feeling light-headed.  Sleepiness.  Nausea.  Increased heart rate.  Headache.  Blurry vision.  Seizure.  Nightmares.  Tingling or numbness around the mouth, lips, or tongue.  A change in speech.  Decreased ability to concentrate.  A change in coordination.  Restless sleep.  Tremors or shakes.  Fainting.  Irritability.  How do I treat hypoglycemia?  If you are alert and able to swallow safely, follow the 15:15 rule:  Take 15 grams of a rapid-acting carbohydrate. Rapid-acting options include: ? 1 tube of glucose gel. ? 3 glucose pills. ? 6-8 pieces of hard candy. ? 4 oz (120 mL) of fruit juice. ? 4 oz (120 mL) of regular (not diet) soda.  Check your blood glucose 15 minutes after you take the carbohydrate.  If the repeat blood glucose level is still at or below 70 mg/dL (3.9 mmol/L), take 15 grams of a carbohydrate again.  If your blood glucose level does not increase above 70 mg/dL (3.9 mmol/L) after 3 tries, seek emergency medical care.  After your blood glucose level returns to normal, eat a meal or a snack within 1 hour.  How do I treat severe hypoglycemia? Severe hypoglycemia is when your blood glucose level is at or below 54 mg/dL (3 mmol/L). Severe hypoglycemia is an emergency. Do not wait to see if the symptoms will go away. Get medical help right away. Call your local emergency services (911 in the [place] Do not drive yourself to the hospital. If you have severe hypoglycemia and you cannot eat or drink, you may need an injection of glucagon. A family member or close friend should learn how to check your blood glucose and how to give you a glucagon injection. Ask your health care provider if you  need to have an emergency glucagon injection kit available. Severe hypoglycemia may need to be treated in a hospital. The treatment may include getting glucose through an IV tube. You may also need treatment for the cause of your hypoglycemia. Can having diabetes put me at risk for other conditions? Having diabetes can put you at risk for other long-term (chronic) conditions, such as heart disease and kidney disease. Your health care provider may prescribe medicines to help prevent complications from diabetes. These medicines may include:  Aspirin.  Medicine to lower cholesterol.  Medicine to control blood pressure.  What else can I do to manage my diabetes? Take your diabetes medicines as told  If your health care provider prescribed insulin or diabetes medicines, take them every day.  Do not run out of insulin or other diabetes medicines that you take. Plan ahead so you always have these available.  If you use insulin, adjust your dosage based on how physically active you are and what foods you eat. Your health care provider will tell you how to adjust your dosage. Make healthy food choices  The things that you eat and drink affect your blood glucose and your insulin dosage. Making good choices helps to control your diabetes and prevent other health problems. A healthy meal plan includes eating lean proteins, complex carbohydrates, fresh fruits and vegetables, low-fat dairy products, and healthy fats. Make an appointment to see a diet and nutrition specialist (registered dietitian) to help you create an eating plan that is right for you. Make sure that you:  Follow instructions from your health care provider about eating or drinking restrictions.  Drink enough fluid to keep your urine clear or pale yellow.  Eat healthy snacks between nutritious meals.  Track the carbohydrates that you eat. Do this by reading food labels and learning the standard serving sizes of foods.  Follow your  sick day plan whenever you cannot eat or drink as usual. Make this plan in advance with your health care provider.  Stay active  Exercise regularly, as told by your health care provider. This may include:  Stretching and doing strength exercises, such as yoga or weightlifting, at least 2 times a week.  Doing at least 150 minutes of moderate-intensity or vigorous-intensity exercise each week. This could be brisk walking, biking, or water aerobics. ? Spread out your activity over at least 3 days of the week. ? Do not go more than 2 days in a row without doing some kind of physical activity.  When you start a new exercise or activity, work with your health care provider to adjust your insulin, medicines, or food intake as needed. Make healthy lifestyle choices  Do not use any tobacco products, such as cigarettes, chewing tobacco, and e-cigarettes. If you need help quitting, ask your health care provider.  If your health care provider says that alcohol is safe for you, limit alcohol intake to no more than 1 drink per day for nonpregnant women and 2 drinks per day for men. One drink equals 12 oz of beer, 5 oz of wine, or 1 oz of hard liquor.  Learn to manage stress. If you need help with this, ask your health care provider. Care for your body   Keep your immunizations up to date. In addition to getting vaccinations as told by your health care provider, it is recommended that you get vaccinated against the following illnesses: ? The flu (influenza). Get a flu shot every year. ? Pneumonia. ? Hepatitis B.  Schedule an eye exam soon after your diagnosis, and then one time every year after that.  Check your skin and feet every day for cuts, bruises, redness, blisters, or sores. Schedule a foot exam with your health care provider once every year.  Brush your teeth and gums two times a day, and floss at least one time a day. Visit your dentist at least once every 6 months.  Maintain a healthy  weight. General instructions  Take over-the-counter and prescription medicines only as told by your health care provider.  Share your diabetes management plan with people  in your workplace, school, and household.  Check your urine for ketones when you are ill and as told by your health care provider.  Ask your health care provider: ? Do I need to meet with a diabetes educator? ? Where can I find a support group for people with diabetes?  Carry a medical alert card or wear medical alert jewelry.  Keep all follow-up visits as told by your health care provider. This is important. Where to find more information: For more information about diabetes, visit:  American Diabetes Association (ADA): www.diabetes.org  American Association of Diabetes Educators (AADE): www.diabeteseducator.org/patient-resources  This information is not intended to replace advice given to you by your health care provider. Make sure you discuss any questions you have with your health care provider. Document Released: 06/13/2015 Document Revised: 07/28/2015 Document Reviewed: 03/25/2015 Elsevier Interactive Patient Education  2017 [place].      Blood Glucose Monitoring, Adult Monitoring your blood sugar (glucose) helps you manage your diabetes. It also helps you and your health care provider determine how well your diabetes management plan is working. Blood glucose monitoring involves checking your blood glucose as often as directed, and keeping a record (log) of your results over time. Why should I monitor my blood glucose? Checking your blood glucose regularly can:  Help you understand how food, exercise, illnesses, and medicines affect your blood glucose.  Let you know what your blood glucose is at any time. You can quickly tell if you are having low blood glucose (hypoglycemia) or high blood glucose (hyperglycemia).  Help you and your health care provider adjust your medicines as needed.  When  should I check my blood glucose? Follow instructions from your health care provider about how often to check your blood glucose.   This may depend on:  The type of diabetes you have.  How well-controlled your diabetes is.  Medicines you are taking.  If you have type 1 diabetes:  Check your blood glucose at least 2 times a day.  Also check your blood glucose: ? Before every insulin injection. ? Before and after exercise. ? Between meals. ? 2 hours after a meal. ? Occasionally between 2:00 a.m. and 3:00 a.m., as directed. ? Before potentially dangerous tasks, like driving or using heavy machinery. ? At bedtime.  You may need to check your blood glucose more often, up to 6-10 times a day: ? If you use an insulin pump. ? If you need multiple daily injections (MDI). ? If your diabetes is not well-controlled. ? If you are ill. ? If you have a history of severe hypoglycemia. ? If you have a history of not knowing when your blood glucose is getting low (hypoglycemia unawareness).  If you have type 2 diabetes:  If you take insulin or other diabetes medicines, check your blood glucose at least 2 times a day.  If you are on intensive insulin therapy, check your blood glucose at least 4 times a day. Occasionally, you may also need to check between 2:00 a.m. and 3:00 a.m., as directed.  Also check your blood glucose: ? Before and after exercise. ? Before potentially dangerous tasks, like driving or using heavy machinery.  You may need to check your blood glucose more often if: ? Your medicine is being adjusted. ? Your diabetes is not well-controlled. ? You are ill.  What is a blood glucose log?  A blood glucose log is a record of your blood glucose readings. It helps you and your health care  provider: ? Look for patterns in your blood glucose over time. ? Adjust your diabetes management plan as needed.  Every time you check your blood glucose, write down your result and notes  about things that may be affecting your blood glucose, such as your diet and exercise for the day.  Most glucose meters store a record of glucose readings in the meter. Some meters allow you to download your records to a computer. How do I check my blood glucose? Follow these steps to get accurate readings of your blood glucose: Supplies needed   Blood glucose meter.  Test strips for your meter. Each meter has its own strips. You must use the strips that come with your meter.  A needle to prick your finger (lancet). Do not use lancets more than once.  A device that holds the lancet (lancing device).  A journal or log book to write down your results.  Procedure  Wash your hands with soap and water.  Prick the side of your finger (not the tip) with the lancet. Use a different finger each time.  Gently rub the finger until a small drop of blood appears.  Follow instructions that come with your meter for inserting the test strip, applying blood to the strip, and using your blood glucose meter.  Write down your result and any notes.  Alternative testing sites  Some meters allow you to use areas of your body other than your finger (alternative sites) to test your blood.  If you think you may have hypoglycemia, or if you have hypoglycemia unawareness, do not use alternative sites. Use your finger instead.  Alternative sites may not be as accurate as the fingers, because blood flow is slower in these areas. This means that the result you get may be delayed, and it may be different from the result that you would get from your finger.  The most common alternative sites are: ? Forearm. ? Thigh. ? Palm of the hand.  Additional tips  Always keep your supplies with you.  If you have questions or need help, all blood glucose meters have a 24-hour "hotline" number that you can call. You may also contact your health care provider.  After you use a few boxes of test strips, adjust  (calibrate) your blood glucose meter by following instructions that came with your meter.    The American Diabetes Association suggests the following targets for most nonpregnant adults with diabetes.  More or less stringent glycemic goals may be appropriate for each individual.  A1C: Less than 7% A1C may also be reported as eAG: Less than 154 mg/dl Before a meal (preprandial plasma glucose): 80-130 mg/dl 1-2 hours after beginning of the meal (Postprandial plasma glucose)*: Less than 180 mg/dl  *Postprandial glucose may be targeted if A1C goals are not met despite reaching preprandial glucose goals.   GOALS in short:  The goals are for the Hgb A1C to be less than 7.0 & blood pressure to be less than 130/80.    It is recommended that all diabetics are educated on and follow a healthy diabetic diet, exercise for 30 minutes 3-4 times per week (walking, biking, swimming, or machine), monitor blood glucose readings and bring that record with you to be reviewed at your next office visit.     You should be checking fasting blood sugars- especially after you eat poorly or eat really healthy, and also check 2 hour postprandial blood sugars after largest meal of the day.  Write these down and bring in your log at each office visit.    You will need to be seen every 3 months by the provider managing your Diabetes unless told otherwise by that provider.   You will need yearly eye exams from an eye specialist and foot exams to check the nerves of your feet.  Also, your urine should be checked yearly as well to make sure excess protein is not present.   If you are checking your blood pressure at home, please record it and bring it to your next office visit.    Follow the Dietary Approaches to Stop Hypertension (DASH) diet (3 servings of fruit and vegetables daily, whole grains, low sodium, low-fat proteins).  See below.    Lastly, when it comes to your cholesterol, the goal is to have the HDL (good  cholesterol) >40, and the LDL (bad cholesterol) <100.   It is recommended that you follow a heart healthy, low saturated and trans-fat diet and exercise for 30 minutes at least 5 times a week.     (( Check out the DASH diet = 1.5 Gram Low Sodium Diet   A 1.5 gram sodium diet restricts the amount of sodium in the diet to no more than 1.5 g or 1500 mg daily.  The American Heart Association recommends Americans over the age of 62 to consume no more than 1500 mg of sodium each day to reduce the risk of developing high blood pressure.  Research also shows that limiting sodium may reduce heart attack and stroke risk.  Many foods contain sodium for flavor and sometimes as a preservative.  When the amount of sodium in a diet needs to be low, it is important to know what to look for when choosing foods and drinks.  The following includes some information and guidelines to help make it easier for you to adapt to a low sodium diet.    QUICK TIPS  Do not add salt to food.  Avoid convenience items and fast food.  Choose unsalted snack foods.  Buy lower sodium products, often labeled as "lower sodium" or "no salt added."  Check food labels to learn how much sodium is in 1 serving.  When eating at a restaurant, ask that your food be prepared with less salt or none, if possible.    READING FOOD LABELS FOR SODIUM INFORMATION  The nutrition facts label is a good place to find how much sodium is in foods. Look for products with no more than 400 mg of sodium per serving.  Remember that 1.5 g = 1500 mg.  The food label may also list foods as:  Sodium-free: Less than 5 mg in a serving.  Very low sodium: 35 mg or less in a serving.  Low-sodium: 140 mg or less in a serving.  Light in sodium: 50% less sodium in a serving. For example, if a food that usually has 300 mg of sodium is changed to become light in sodium, it will have 150 mg of sodium.  Reduced sodium: 25% less sodium in a serving. For example, if a food  that usually has 400 mg of sodium is changed to reduced sodium, it will have 300 mg of sodium.    CHOOSING FOODS  Grains  Avoid: Salted crackers and snack items. Some cereals, including instant hot cereals. Bread stuffing and biscuit mixes. Seasoned rice or pasta mixes.  Choose: Unsalted snack items. Low-sodium cereals, oats, puffed wheat and rice, shredded wheat. English muffins and  bread. Pasta.  Meats  Avoid: Salted, canned, smoked, spiced, pickled meats, including fish and poultry. Bacon, ham, sausage, cold cuts, hot dogs, anchovies.  Choose: Low-sodium canned tuna and salmon. Fresh or frozen meat, poultry, and fish.  Dairy  Avoid: Processed cheese and spreads. Cottage cheese. Buttermilk and condensed milk. Regular cheese.  Choose: Milk. Low-sodium cottage cheese. Yogurt. Sour cream. Low-sodium cheese.  Fruits and Vegetables  Avoid: Regular canned vegetables. Regular canned tomato sauce and paste. Frozen vegetables in sauces. Olives. Angie Fava. Relishes. Sauerkraut.  Choose: Low-sodium canned vegetables. Low-sodium tomato sauce and paste. Frozen or fresh vegetables. Fresh and frozen fruit.  Condiments  Avoid: Canned and packaged gravies. Worcestershire sauce. Tartar sauce. Barbecue sauce. Soy sauce. Steak sauce. Ketchup. Onion, garlic, and table salt. Meat flavorings and tenderizers.  Choose: Fresh and dried herbs and spices. Low-sodium varieties of mustard and ketchup. Lemon juice. Tabasco sauce. Horseradish.    SAMPLE 1.5 GRAM SODIUM MEAL PLAN:   Breakfast / Sodium (mg)  1 cup low-fat milk / 143 mg  1 whole-wheat English muffin / 240 mg  1 tbs heart-healthy margarine / 153 mg  1 hard-boiled egg / 139 mg  1 small orange / 0 mg  Lunch / Sodium (mg)  1 cup raw carrots / 76 mg  2 tbs no salt added peanut butter / 5 mg  2 slices whole-wheat bread / 270 mg  1 tbs jelly / 6 mg   cup red grapes / 2 mg  Dinner / Sodium (mg)  1 cup whole-wheat pasta / 2 mg  1 cup low-sodium tomato  sauce / 73 mg  3 oz lean ground beef / 57 mg  1 small side salad (1 cup raw spinach leaves,  cup cucumber,  cup yellow bell pepper) with 1 tsp olive oil and 1 tsp red wine vinegar / 25 mg  Snack / Sodium (mg)  1 container low-fat vanilla yogurt / 107 mg  3 graham cracker squares / 127 mg  Nutrient Analysis  Calories: 1745  Protein: 75 g  Carbohydrate: 237 g  Fat: 57 g  Sodium: 1425 mg  Document Released: 02/19/2005 Document Revised: 11/01/2010 Document Reviewed: 05/23/2009  ExitCare Patient Information 2012 [place].))    This information is not intended to replace advice given to you by your health care provider. Make sure you discuss any questions you have with your health care provider. Document Released: 02/22/2003 Document Revised: 09/09/2015 Document Reviewed: 08/01/2015 Elsevier Interactive Patient Education  2017 [employer].

## 2017-08-14 ENCOUNTER — Encounter: Payer: Self-pay | Admitting: Family Medicine

## 2017-08-14 ENCOUNTER — Ambulatory Visit (INDEPENDENT_AMBULATORY_CARE_PROVIDER_SITE_OTHER): Payer: Medicare Other | Admitting: Ophthalmology

## 2017-08-14 ENCOUNTER — Encounter (INDEPENDENT_AMBULATORY_CARE_PROVIDER_SITE_OTHER): Payer: Self-pay | Admitting: Ophthalmology

## 2017-08-14 DIAGNOSIS — E113393 Type 2 diabetes mellitus with moderate nonproliferative diabetic retinopathy without macular edema, bilateral: Secondary | ICD-10-CM | POA: Diagnosis not present

## 2017-08-14 DIAGNOSIS — H469 Unspecified optic neuritis: Secondary | ICD-10-CM | POA: Diagnosis not present

## 2017-08-14 DIAGNOSIS — H3091 Unspecified chorioretinal inflammation, right eye: Secondary | ICD-10-CM | POA: Diagnosis not present

## 2017-08-14 DIAGNOSIS — A5271 Late syphilitic oculopathy: Secondary | ICD-10-CM | POA: Diagnosis not present

## 2017-08-14 DIAGNOSIS — H25813 Combined forms of age-related cataract, bilateral: Secondary | ICD-10-CM | POA: Diagnosis not present

## 2017-08-14 DIAGNOSIS — H3581 Retinal edema: Secondary | ICD-10-CM

## 2017-08-14 DIAGNOSIS — Z21 Asymptomatic human immunodeficiency virus [HIV] infection status: Secondary | ICD-10-CM | POA: Diagnosis not present

## 2017-08-18 ENCOUNTER — Encounter (INDEPENDENT_AMBULATORY_CARE_PROVIDER_SITE_OTHER): Payer: Self-pay | Admitting: Ophthalmology

## 2017-10-17 ENCOUNTER — Other Ambulatory Visit: Payer: Self-pay | Admitting: Infectious Disease

## 2017-10-17 DIAGNOSIS — F419 Anxiety disorder, unspecified: Secondary | ICD-10-CM

## 2017-11-02 ENCOUNTER — Other Ambulatory Visit: Payer: Self-pay | Admitting: Family Medicine

## 2017-11-02 DIAGNOSIS — I10 Essential (primary) hypertension: Secondary | ICD-10-CM

## 2017-11-02 DIAGNOSIS — E038 Other specified hypothyroidism: Secondary | ICD-10-CM

## 2017-11-03 ENCOUNTER — Other Ambulatory Visit: Payer: Self-pay | Admitting: Family Medicine

## 2017-11-03 DIAGNOSIS — E119 Type 2 diabetes mellitus without complications: Secondary | ICD-10-CM

## 2017-11-03 DIAGNOSIS — E1121 Type 2 diabetes mellitus with diabetic nephropathy: Secondary | ICD-10-CM

## 2017-11-07 ENCOUNTER — Other Ambulatory Visit: Payer: Self-pay | Admitting: Family Medicine

## 2017-11-07 DIAGNOSIS — I10 Essential (primary) hypertension: Secondary | ICD-10-CM

## 2017-11-08 ENCOUNTER — Other Ambulatory Visit: Payer: Self-pay | Admitting: Family Medicine

## 2017-11-08 DIAGNOSIS — E782 Mixed hyperlipidemia: Principal | ICD-10-CM

## 2017-11-08 DIAGNOSIS — E1169 Type 2 diabetes mellitus with other specified complication: Secondary | ICD-10-CM

## 2017-11-28 DIAGNOSIS — H319 Unspecified disorder of choroid: Secondary | ICD-10-CM | POA: Diagnosis not present

## 2017-11-28 DIAGNOSIS — H35033 Hypertensive retinopathy, bilateral: Secondary | ICD-10-CM | POA: Diagnosis not present

## 2017-11-28 DIAGNOSIS — H35043 Retinal micro-aneurysms, unspecified, bilateral: Secondary | ICD-10-CM | POA: Diagnosis not present

## 2017-11-28 DIAGNOSIS — H47291 Other optic atrophy, right eye: Secondary | ICD-10-CM | POA: Diagnosis not present

## 2017-11-28 DIAGNOSIS — E113293 Type 2 diabetes mellitus with mild nonproliferative diabetic retinopathy without macular edema, bilateral: Secondary | ICD-10-CM | POA: Diagnosis not present

## 2017-12-15 ENCOUNTER — Other Ambulatory Visit: Payer: Self-pay | Admitting: Family Medicine

## 2017-12-15 DIAGNOSIS — E1169 Type 2 diabetes mellitus with other specified complication: Secondary | ICD-10-CM

## 2017-12-15 DIAGNOSIS — E782 Mixed hyperlipidemia: Principal | ICD-10-CM

## 2017-12-16 ENCOUNTER — Ambulatory Visit (INDEPENDENT_AMBULATORY_CARE_PROVIDER_SITE_OTHER): Payer: Medicare Other | Admitting: Family Medicine

## 2017-12-16 ENCOUNTER — Encounter: Payer: Self-pay | Admitting: Family Medicine

## 2017-12-16 VITALS — BP 118/74 | HR 68 | Ht 69.0 in | Wt 202.0 lb

## 2017-12-16 DIAGNOSIS — E1129 Type 2 diabetes mellitus with other diabetic kidney complication: Secondary | ICD-10-CM | POA: Diagnosis not present

## 2017-12-16 DIAGNOSIS — E1169 Type 2 diabetes mellitus with other specified complication: Secondary | ICD-10-CM

## 2017-12-16 DIAGNOSIS — E782 Mixed hyperlipidemia: Secondary | ICD-10-CM

## 2017-12-16 DIAGNOSIS — E1159 Type 2 diabetes mellitus with other circulatory complications: Secondary | ICD-10-CM

## 2017-12-16 DIAGNOSIS — Z23 Encounter for immunization: Secondary | ICD-10-CM | POA: Diagnosis not present

## 2017-12-16 DIAGNOSIS — E038 Other specified hypothyroidism: Secondary | ICD-10-CM

## 2017-12-16 DIAGNOSIS — E119 Type 2 diabetes mellitus without complications: Secondary | ICD-10-CM | POA: Diagnosis not present

## 2017-12-16 DIAGNOSIS — I10 Essential (primary) hypertension: Secondary | ICD-10-CM

## 2017-12-16 DIAGNOSIS — E559 Vitamin D deficiency, unspecified: Secondary | ICD-10-CM

## 2017-12-16 DIAGNOSIS — E1121 Type 2 diabetes mellitus with diabetic nephropathy: Secondary | ICD-10-CM

## 2017-12-16 DIAGNOSIS — R809 Proteinuria, unspecified: Secondary | ICD-10-CM

## 2017-12-16 DIAGNOSIS — R5382 Chronic fatigue, unspecified: Secondary | ICD-10-CM

## 2017-12-16 DIAGNOSIS — B20 Human immunodeficiency virus [HIV] disease: Secondary | ICD-10-CM

## 2017-12-16 LAB — POCT GLYCOSYLATED HEMOGLOBIN (HGB A1C): HEMOGLOBIN A1C: 5.9 % — AB (ref 4.0–5.6)

## 2017-12-16 NOTE — Progress Notes (Signed)
Impression and Recommendations:    1. Controlled type 2 diabetes mellitus without complication, without long-term current use of insulin ([place])   2. Mixed diabetic hyperlipidemia associated with type 2 diabetes mellitus ([place])   3. Microalbuminuria due to type 2 diabetes mellitus ([place])   4. Hypertension associated with diabetes ([place])   5. Diabetic nephropathy associated with type 2 diabetes mellitus: Microalbuminuria   6. Human immunodeficiency virus (HIV) disease ([place])   7. Other specified hypothyroidism   8. Chronic fatigue   9. Vitamin D deficiency   10. Flu vaccine need    DM -Encouraged pt to monitor diet and try to increase exercise to continue control of A1C -Educated pt that his A1C has increased moderately so he has to be careful not to get out of control -Discussed negative impact of weight gain on diabetes progression and A1C  HLD -Encouraged pt to make good dietary choices to help with controlling his HLD -Encouraged pt to continue taking medication as directed  - Chol panel UTD, at goal  HTN -Encouraged pt to continue taking medication as directed  -Discussed importance of maintaining good control of blood pressure - BP at goal  Health Management -Explained the difference between chronic follow up appointments and physical appointments -Encouraged pt to return for a physical in order to check prostate and overall health   Education and routine counseling performed. Handouts provided.  The patient was counseled, risk factors were discussed, anticipatory guidance given.  Gross side effects, risk and benefits, and alternatives of medications discussed with patient.  Patient is aware that all medications have potential side effects and we are unable to predict every side effect or drug-drug interaction that may occur.  Expresses verbal understanding and consents to current therapy plan and treatment regimen.   Return for DM, BP, Thyroid follow up every 4 mo;   ALSO medicare wellness 75mo   Please see AVS handed out to patient at the end of our visit for further patient instructions/ counseling done pertaining to today's office visit.    Note:  This document was prepared using Dragon voice recognition software and may include unintentional dictation errors. This document serves as a record of services personally performed by [name] MD. It was created on her behalf by LGeorga Bora a trained medical scribe. The creation of this record is based on the scribe's personal observations and the provider's statements to them.   I have reviewed the above medical documentation for accuracy and completeness and I concur.  [name]10/14/19 12:27 PM       Subjective:    Chief Complaint  Patient presents with  . Follow-up     Jonathan Estes a 62y.o. male [facility]     DM HPI: -A1C is 5.9 -  He states he has been eating sweets but he has been active outside -Pt is currently maintained on metformin for diabetes  Home glucose readings range "never over 130 but I don't check them a lot"   Denies polyuria/polydipsia. Denies hypo/ hyperglycemia symptoms - He denies new onset of: chest pain, exercise intolerance, shortness of breath, dizziness, visual changes, headache, lower extremity swelling or claudication.   Last diabetic eye exam was  Lab Results  Component Value Date   HMDIABEYEEXA No Retinopathy 11/29/2016    Foot exam- UTD  Last A1C in the office was:  Lab Results  Component Value Date  HGBA1C 5.9 (A) 12/16/2017   HGBA1C 5.5 07/23/2017   HGBA1C 5.7 04/15/2017    Lab Results  Component Value Date   MICROALBUR <0.2 08/12/2017   LDLCALC 83 07/23/2017   CREATININE 1.07 08/12/2017     CHOL HPI:   -  He  is currently managed with:  See med list from today -Pt has been taking his medications as directed -Says he has been  trying to eat healthy  RUQ pain- denies    Muscle aches- denies  No other s-e  Last lipid panel as follows:  Lab Results  Component Value Date   CHOL 152 07/23/2017   HDL 45 07/23/2017   LDLCALC 83 07/23/2017   TRIG 143 07/23/2017   CHOLHDL 3.4 07/23/2017    Hepatic Function Latest Ref Rng & Units 07/23/2017 03/21/2017 03/21/2017  Total Protein 6.1 - 8.1 g/dL 7.9 8.3(H) 8.2(H)  Albumin 3.5 - 5.0 g/dL - 4.3 -  AST 10 - 35 U/L _0 ALT 9 - 46 U/L 11 16(L) 16  Alk Phosphatase 38 - 126 U/L - 99 -  Total Bilirubin 0.2 - 1.2 mg/dL 0.5 0.7 0.4  Bilirubin, Direct 0.0 - 0.3 mg/dL - - -    HTN HPI:  -  His blood pressure has been controlled at home.  Pt is checking it at home occasionally -Says its around 117-128 but he can't remember the bottom numbers  - Patient reports good compliance with blood pressure medications  - Denies medication S-E   - Smoking Status noted   - He denies new onset of: chest pain, exercise intolerance, shortness of breath, dizziness, visual changes, headache, lower extremity swelling or claudication.    Last 3 blood pressure readings in our office are as follows: BP Readings from Last 3 Encounters:  12/16/17 118/74  08/14/17 113/72  08/12/17 116/75    Filed Weights   12/16/17 1008  Weight: 202 lb (91.6 kg)          Last 3 blood pressure readings in our office are as follows: BP Readings from Last 3 Encounters:  12/16/17 118/74  08/14/17 113/72  08/12/17 116/75    BMI Readings from Last 3 Encounters:  12/16/17 29.83 kg/m  08/14/17 29.09 kg/m  08/12/17 30.41 kg/m     No problems updated.    Patient Care Team    Relationship Specialty Notifications Start End  Mellody Dance, DO PCP - General Family Medicine  11/26/16   Inda Castle, MD (Inactive) Consulting Physician Gastroenterology  07/23/16   Tommy Medal, Lavell Islam, MD Consulting Physician Infectious Diseases  12/31/16    Comment: HIV doc  Beverely Low, [place]  Referring Physician Optometry  12/31/16      Patient Active Problem List   Diagnosis Date Noted  . Microalbuminuria due to type 2 diabetes mellitus ([place]) 12/31/2016    Priority: High  . Dyslipidemia with low high density lipoprotein (HDL) cholesterol with hypertriglyceridemia due to type 2 diabetes mellitus ([place]) 07/23/2016    Priority: High  . Diabetic nephropathy associated with type 2 diabetes mellitus: Microalbuminuria 07/23/2016    Priority: High  . Mixed diabetic hyperlipidemia associated with type 2 diabetes mellitus ([place]) 07/11/2010    Priority: High  . Controlled type 2 diabetes mellitus without complication, without long-term current use of insulin ([place]) 02/07/2009    Priority: High  . Hypertension associated with diabetes ([place]) 04/12/2008    Priority: High  . Family history of colon cancer in father- in 20's 11/26/2016  Priority: Medium  . CAD (coronary artery disease) 07/16/2011    Priority: Medium  . Hypothyroidism 04/24/2008    Priority: Medium  . Human immunodeficiency virus (HIV) disease ([place]) 10/14/2007    Priority: Medium  . Uveitis 03/21/2017    Priority: Low  . Vitamin D deficiency 12/31/2016    Priority: Low  . Environmental and seasonal allergies 07/30/2016    Priority: Low  . Anxiety 01/29/2011    Priority: Low  . DEPRESSION/ANXIETY 06/27/2009    Priority: Low  . h/o COCAINE ABUSE, EPISODIC 10/14/2007    Priority: Low  . CANNABIS ABUSE, HX OF 10/14/2007    Priority: Low  . AKI (acute kidney injury) ([place]) 08/12/2017  . 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 Change in vision 03/21/2017  . Low level of high density lipoprotein (HDL) 12/30/2016  . Hypertriglyceridemia 12/30/2016  . External hemorrhoids without complication 70/35/0093  . Dyspnea 05/10/2011  . Abnormal chest CT 04/12/2011  . HTN (hypertension)   . PULMONARY NODULE 02/21/2010  . ORBITAL FLOOR , CLOSED FRACTURE 02/21/2010  . UNSPECIFIED VISUAL LOSS 12/26/2009  . TRANSIENT GLOBAL AMNESIA  12/26/2009  . SHOULDER STRAIN 06/13/2009  . FATIGUE 04/12/2008  . METHICILLIN SUSCEPTIBLE STAPH INF CCE & UNS SITE 10/14/2007  . CANDIDIASIS OF MOUTH 10/14/2007  . PNEUMOCYSTIS PNEUMONIA 10/14/2007  . CUTANEOUS ERUPTIONS, DRUG-INDUCED 10/14/2007     Past Medical History:  Diagnosis Date  . AIDS ([place]) 07/12/2014  . AKI (acute kidney injury) ([place]) 08/12/2017  . Anxiety   . Arthritis    "hips, shoulders" (03/21/2017)  . Bilateral bunions    left foot worse  . Coronary artery disease involving native coronary artery 01/12/2015  . Diabetes mellitus type 2, controlled, without complications ([place]) 81/10/2991  . Diabetes type 2, controlled ([place]) 07/12/2014  . High triglycerides   . HTN (hypertension)   . Human immunodeficiency virus (HIV) disease ([place]) d'x 09/2007  . Hyperlipidemia   . Hypothyroid   . 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 BRONCHOSCOPY  09/29/2007   Bronchoalveolar lavage was  obtained from the left lower lobe.  Under fluoroscopy, transbronchial Archie Endo 07/04/2010  . CONDYLOMA EXCISION/FULGURATION  ~ 1996   anal/notes 07/04/2010     Family History  Problem Relation Age of Onset  . Colon cancer Father   . Leukemia Maternal Grandfather      Social History   Substance and Sexual Activity  Drug Use Yes  . Types: Marijuana, Cocaine   Comment: 03/21/2017 "no cocaine for a few years; I smoke marijuana qd"  ,  Social History   Substance and Sexual Activity  Alcohol Use Yes   Comment: 03/21/2017 "none since 01/12/2017"  ,  Social History   Tobacco Use  Smoking Status Never Smoker  Smokeless Tobacco Never Used  ,    Current Outpatient Medications on File Prior to Visit  Medication Sig Dispense Refill  . ALPRAZolam (XANAX) 0.5 MG tablet TAKE 1 TABLET BY MOUTH 3 TIMES A DAY AS NEEDED 90 tablet 1  . bictegravir-emtricitabine-tenofovir AF (BIKTARVY) 50-200-25 MG TABS tablet  Take 1 tablet by mouth daily. 30 tablet 11  . cetirizine (ZYRTEC) 10 MG tablet TAKE 1 TABLET (10 MG TOTAL) BY MOUTH DAILY. 90 tablet 2  . Cholecalciferol (VITAMIN D3) 5000 units CAPS Take 1 capsule (5,000 Units total) by mouth daily. 120 capsule 4  . [facility] LANCETS ORIGINAL MISC 1 application by Does not apply route daily.  90 each 4  . gemfibrozil (LOPID) 600 MG tablet TAKE 1/2 TABLET BY MOUTH TWICE A DAY 90 tablet 1  . glucose blood (ACCU-CHEK AVIVA) test strip Use as instructed 100 each 12  . hydrochlorothiazide (MICROZIDE) 12.5 MG capsule TAKE 1 CAPSULE BY MOUTH EVERY DAY 90 capsule 1  . levothyroxine (SYNTHROID, LEVOTHROID) 25 MCG tablet TAKE 1 TABLET BY MOUTH EVERY DAY ON AN EMPTY STOMACH 90 tablet 1  . lisinopril (PRINIVIL,ZESTRIL) 40 MG tablet Take 1 tablet (40 mg total) by mouth daily. 90 tablet 1  . metFORMIN (GLUCOPHAGE) 500 MG tablet Take 0.5 tablets (250 mg total) by mouth 2 (two) times daily with a meal. 180 tablet 1  . Misc Natural Products (OSTEO BI-FLEX JOINT SHIELD PO) Take 1 tablet by mouth daily.    . Multiple Vitamins-Minerals (ICAPS AREDS 2 PO) Take by mouth.    . pravastatin (PRAVACHOL) 20 MG tablet TAKE 1 TABLET BY MOUTH EVERYDAY AT BEDTIME 90 tablet 1  . zinc gluconate 50 MG tablet Take 50 mg by mouth daily.     No current facility-administered medications on file prior to visit.      Allergies  Allergen Reactions  . Sulfamethoxazole-Trimethoprim     REACTION: severe rash     Review of Systems:   General:  Denies fever, chills Optho/Auditory:   Denies visual changes, blurred vision Respiratory:   Denies SOB, cough, wheeze, DIB  Cardiovascular:   Denies chest pain, palpitations, painful respirations Gastrointestinal:   Denies nausea, vomiting, diarrhea.  Endocrine:     Denies new hot or cold intolerance Musculoskeletal:  Denies joint swelling, gait issues, or new unexplained myalgias/ arthralgias Skin:  Denies rash, suspicious lesions  Neurological:    Denies  dizziness, unexplained weakness, numbness  Psychiatric/Behavioral:   Denies mood changes    Objective:     Blood pressure 118/74, pulse 68, height _0  (1.753 m), weight 202 lb (91.6 kg), SpO2 98 %.  Body mass index is 29.83 kg/m.  General: Well Developed, well nourished, and in no acute distress.  HEENT: Normocephalic, atraumatic, pupils equal round reactive to light, neck supple, No carotid bruits, no JVD Skin: Warm and dry, cap RF less 2 sec Cardiac: Regular rate and rhythm, S1, S2 WNL's, no murmurs rubs or gallops Respiratory: ECTA B/L, Not using accessory muscles, speaking in full sentences. NeuroM-Sk: Ambulates w/o assistance, moves ext * 4 w/o difficulty, sensation grossly intact.  Ext: scant edema b/l lower ext Psych: No HI/SI, judgement and insight good, Euthymic mood. Full Affect.

## 2017-12-16 NOTE — Patient Instructions (Addendum)

## 2017-12-17 LAB — T4, FREE: FREE T4: 1.17 ng/dL (ref 0.82–1.77)

## 2017-12-17 LAB — VITAMIN D 25 HYDROXY (VIT D DEFICIENCY, FRACTURES): VIT D 25 HYDROXY: 51.5 ng/mL (ref 30.0–100.0)

## 2017-12-17 LAB — TSH: TSH: 2.6 u[IU]/mL (ref 0.450–4.500)

## 2017-12-17 LAB — MAGNESIUM: MAGNESIUM: 2 mg/dL (ref 1.6–2.3)

## 2017-12-24 ENCOUNTER — Other Ambulatory Visit: Payer: Self-pay

## 2017-12-24 NOTE — Patient Outreach (Signed)
[facility]) Care Management  12/24/2017  Jonathan Estes Apr 10, 1955 [id number]   Medication Adherence call to Mr : Jonathan Estes left a message for patient to call back patient is due on Metformin 500 mg  [name] is showing past due under [place] [employer].   Lillia Abed CPhT Pharmacy Technician [facility] Management Direct Dial [phone]  Fax [phone] [email or web address]

## 2018-01-06 ENCOUNTER — Other Ambulatory Visit: Payer: Self-pay | Admitting: Infectious Disease

## 2018-01-06 DIAGNOSIS — F419 Anxiety disorder, unspecified: Secondary | ICD-10-CM

## 2018-01-09 ENCOUNTER — Other Ambulatory Visit: Payer: Self-pay | Admitting: Behavioral Health

## 2018-01-09 ENCOUNTER — Other Ambulatory Visit: Payer: Self-pay | Admitting: Infectious Disease

## 2018-01-09 DIAGNOSIS — F419 Anxiety disorder, unspecified: Secondary | ICD-10-CM

## 2018-01-14 ENCOUNTER — Encounter (INDEPENDENT_AMBULATORY_CARE_PROVIDER_SITE_OTHER): Payer: Self-pay | Admitting: *Deleted

## 2018-01-14 VITALS — BP 104/69 | HR 55 | Temp 97.8°F | Wt 205.0 lb

## 2018-01-14 DIAGNOSIS — Z006 Encounter for examination for normal comparison and control in clinical research program: Secondary | ICD-10-CM

## 2018-01-14 NOTE — Research (Signed)
Jonathan Estes was here for his week 288 visit for A5321 and week 312 visit for The HAILO Study: A Long Term follow-up of Older HIV-Infected Adults in the ACTG, an observational study addressing the issues of aging, HIV infection and Inflammation  He denies any new problems or concerns. He had a flu shot at his primary office visit in October. He will be returning in April for the next study visit.

## 2018-01-15 LAB — COMPLETE METABOLIC PANEL WITH GFR
AG RATIO: 1.4 (calc) (ref 1.0–2.5)
ALBUMIN MSPROF: 4.1 g/dL (ref 3.6–5.1)
ALKALINE PHOSPHATASE (APISO): 92 U/L (ref 40–115)
ALT: 8 U/L — ABNORMAL LOW (ref 9–46)
AST: 14 U/L (ref 10–35)
BUN: 15 mg/dL (ref 7–25)
CHLORIDE: 101 mmol/L (ref 98–110)
CO2: 28 mmol/L (ref 20–32)
Calcium: 9.4 mg/dL (ref 8.6–10.3)
Creat: 1.09 mg/dL (ref 0.70–1.25)
GFR, EST AFRICAN AMERICAN: 85 mL/min/{1.73_m2} (ref >=60)
GFR, Est Non African American: 73 mL/min/{1.73_m2} (ref >=60)
GLUCOSE: 100 mg/dL — AB (ref 65–99)
Globulin: 2.9 g/dL (calc) (ref 1.9–3.7)
POTASSIUM: 4.3 mmol/L (ref 3.5–5.3)
SODIUM: 139 mmol/L (ref 135–146)
Total Bilirubin: 0.3 mg/dL (ref 0.2–1.2)
Total Protein: 7 g/dL (ref 6.1–8.1)

## 2018-01-15 LAB — HEPATITIS C ANTIBODY
Hepatitis C Ab: NONREACTIVE
SIGNAL TO CUT-OFF: 0.05 (ref <1.00)

## 2018-01-21 ENCOUNTER — Encounter: Payer: Self-pay | Admitting: Family Medicine

## 2018-01-21 ENCOUNTER — Ambulatory Visit (INDEPENDENT_AMBULATORY_CARE_PROVIDER_SITE_OTHER): Payer: Medicare Other | Admitting: Family Medicine

## 2018-01-21 VITALS — BP 113/75 | HR 67 | Temp 98.3°F | Ht 69.0 in | Wt 202.0 lb

## 2018-01-21 DIAGNOSIS — Z1211 Encounter for screening for malignant neoplasm of colon: Secondary | ICD-10-CM

## 2018-01-21 DIAGNOSIS — Z0000 Encounter for general adult medical examination without abnormal findings: Secondary | ICD-10-CM

## 2018-01-21 LAB — POC HEMOCCULT BLD/STL (OFFICE/1-CARD/DIAGNOSTIC): Fecal Occult Blood, POC: NEGATIVE

## 2018-01-21 NOTE — Progress Notes (Signed)
Male physical  Impression and Recommendations:    1. Encounter for health maintenance examination in adult   2. Screening for colon cancer   3. Laboratory tests ordered as part of a complete physical exam (CPE)     1) Anticipatory Guidance: Discussed importance of wearing a seatbelt while driving, not texting while driving;   sunscreen when outside along with skin surveillance; eating a balanced and modest diet; physical activity at least 25 minutes per day or 150 min/ week moderate to intense activity.  2) Immunizations / Screenings / Labs: All immunizations are up-to-date per recommendations or will be updated today. Patient is due for dental and vision screens which pt will schedule independently. Will obtain CBC, CMP, HgA1c, Lipid panel, TSH and vit D when fasting, if not already done recently.   - Need for colonoscopy.  Patient due for colonoscopy; over 6 months overdue.  - Need for Shingles vaccine.  3) Weight:   BMI meaning discussed with patient.  Discussed goal of losing 5-10% of current body weight which would improve overall feelings of well being and improve objective health data. Improve nutrient density of diet through increasing intake of fruits and vegetables and decreasing saturated fats, white flour products and refined sugars.   4) BMI Counseling Explained to patient what BMI refers to, and what it means medically.    Told patient to think about it as a "medical risk stratification measurement" and how increasing BMI is associated with increasing risk/ or worsening state of various diseases such as hypertension, hyperlipidemia, diabetes, premature OA, depression etc.  American Heart Association guidelines for healthy diet, basically Mediterranean diet, and exercise guidelines of 30 minutes 5 days per week or more discussed in detail.  Health counseling performed.  All questions answered.  5) Lifestyle & Preventative Health Maintenance - Advised patient to  continue working toward exercising to improve overall mental, physical, and emotional health.    - Reviewed the "spokes of the wheel" of mood and health management.  Stressed the importance of ongoing prudent habits, including regular exercise, appropriate sleep hygiene, healthful dietary habits, and prayer/meditation to relax.  - Encouraged patient to engage in daily physical activity, especially a formal exercise routine.  Recommended that the patient eventually strive for at least 150 minutes of moderate cardiovascular activity per week according to guidelines established by the [facility].   - Healthy dietary habits encouraged, including low-carb, and high amounts of lean protein in diet.   - Patient should also consume adequate amounts of water.  6) Follow-Up - Prescriptions refilled today. - Re-check fasting lab work as recommended. - Continue to follow up with specialists as scheduled. - Otherwise, continue to return for CPE and chronic follow-up as scheduled.   - Patient knows to call in sooner if desired to address acute concerns.     Orders Placed This Encounter  Procedures  . POC Hemoccult Bld/Stl (1-Cd Office Dx)    No orders of the defined types were placed in this encounter.    Gross side effects, risk and benefits, and alternatives of medications discussed with patient.  Patient is aware that all medications have potential side effects and we are unable to predict every side effect or drug-drug interaction that may occur.  Expresses verbal understanding and consents to current therapy plan and treatment regimen.  Please see AVS handed out to patient at the end of our visit for further patient instructions/ counseling done pertaining to today's office visit.  Follow-up preventative CPE in  1 year. Follow-up office visit pending lab work.  F/up sooner for chronic care management and/or prn  This document serves as a record of services personally performed by [name],  DO. It was created on her behalf by Peggye Fothergill, a trained medical scribe. The creation of this record is based on the scribe's personal observations and the provider's statements to them.   I have reviewed the above medical documentation for accuracy and completeness and I concur.  [name], DO, D.O. 01/23/2018 9:12 AM        Subjective:    CC: CPE  HPI: Jonathan Estes is a 62 y.o. male who presents to [facility] today for a yearly health maintenance exam.    Health Maintenance Summary Reviewed and updated, unless pt declines services.  Colonoscopy:  Patient with need for colonoscopy.   Over 6 months overdue for follow-up. Tobacco History Reviewed:  Never a tobacco smoker. Alcohol:  No concerns, no excessive use Exercise Habits: Not meeting AHA guidelines. STD concerns:  No change from prior. Drug Use:  Continues smoking cannabis daily, about a gram per day. Birth control method:   N/a. Testicular/penile concerns:  Denies genital discharge, pain, or symptoms. Denies new rashes or issues that he has noticed.   Overall, denies complaints.  Visual Health Recently went for eye exam in September 2019.  Dermatological Health Denies any new marks or changes on his skin.  Patient does not follow up with urology.  Denies diarrhea, constipation, or difficulty moving bowels.  States "everything's been fairly regular."  Denies urinary symptoms.    Immunization History  Administered Date(s) Administered  . Hepatitis A 04/12/2008, 09/13/2008  . Hepatitis B 04/12/2008, 09/13/2008, 02/07/2009, 12/17/2011  . Hepatitis B, adult 12/01/2015, 01/04/2016, 05/30/2016  . Influenza Split 11/27/2010, 12/17/2011  . Influenza Whole 12/29/2007, 01/24/2009, 04/17/2010  . Influenza,inj,Quad PF,6+ Mos 11/10/2012, 11/04/2013, 12/01/2015, 11/29/2016, 12/16/2017  . Influenza,inj,Quad PF,6-35 Mos 12/16/2014  . Pneumococcal Conjugate-13 08/12/2017  .  Pneumococcal Polysaccharide-23 10/07/2007, 01/30/2010, 04/04/2017  . Td 02/07/2009  . Tdap 01/29/2010    Health Maintenance  Topic Date Due  . OPHTHALMOLOGY EXAM  11/29/2017  . COLONOSCOPY  08/14/2018 (Originally 05/22/2017)  . HEMOGLOBIN A1C  06/17/2018  . FOOT EXAM  12/17/2018  . TETANUS/TDAP  01/30/2020  . INFLUENZA VACCINE  Completed  . PNEUMOCOCCAL POLYSACCHARIDE VACCINE AGE 23-64 HIGH RISK  Completed  . Hepatitis C Screening  Completed  . HIV Screening  Completed      Wt Readings from Last 3 Encounters:  01/21/18 202 lb (91.6 kg)  01/14/18 205 lb (93 kg)  12/16/17 202 lb (91.6 kg)   BP Readings from Last 3 Encounters:  01/21/18 113/75  01/14/18 104/69  12/16/17 118/74   Pulse Readings from Last 3 Encounters:  01/21/18 67  01/14/18 (!) 55  12/16/17 68    Patient Active Problem List   Diagnosis Date Noted  . Microalbuminuria due to type 2 diabetes mellitus (HCC) 12/31/2016    Priority: High  . Dyslipidemia with low high density lipoprotein (HDL) cholesterol with hypertriglyceridemia due to type 2 diabetes mellitus (HCC) 07/23/2016    Priority: High  . Diabetic nephropathy associated with type 2 diabetes mellitus: Microalbuminuria 07/23/2016    Priority: High  . Mixed diabetic hyperlipidemia associated with type 2 diabetes mellitus (HCC) 07/11/2010    Priority: High  . Controlled type 2 diabetes mellitus without complication, without long-term current use of insulin (HCC) 02/07/2009    Priority: High  .  Hypertension associated with diabetes (HCC) 04/12/2008    Priority: High  . Family history of colon cancer in father- in 5960's 11/26/2016    Priority: Medium  . CAD (coronary artery disease) 07/16/2011    Priority: Medium  . Hypothyroidism 04/24/2008    Priority: Medium  . Human immunodeficiency virus (HIV) disease (HCC) 10/14/2007    Priority: Medium  . Uveitis 03/21/2017    Priority: Low  . Vitamin D deficiency 12/31/2016    Priority: Low  . Environmental  and seasonal allergies 07/30/2016    Priority: Low  . Anxiety 01/29/2011    Priority: Low  . DEPRESSION/ANXIETY 06/27/2009    Priority: Low  . h/o COCAINE ABUSE, EPISODIC 10/14/2007    Priority: Low  . CANNABIS ABUSE, HX OF 10/14/2007    Priority: Low  . AKI (acute kidney injury) (HCC) 08/12/2017  . 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 Change in vision 03/21/2017  . Low level of high density lipoprotein (HDL) 12/30/2016  . Hypertriglyceridemia 12/30/2016  . External hemorrhoids without complication 11/26/2016  . Dyspnea 05/10/2011  . Abnormal chest CT 04/12/2011  . HTN (hypertension)   . PULMONARY NODULE 02/21/2010  . ORBITAL FLOOR , CLOSED FRACTURE 02/21/2010  . UNSPECIFIED VISUAL LOSS 12/26/2009  . TRANSIENT GLOBAL AMNESIA 12/26/2009  . SHOULDER STRAIN 06/13/2009  . FATIGUE 04/12/2008  . METHICILLIN SUSCEPTIBLE STAPH INF CCE & UNS SITE 10/14/2007  . CANDIDIASIS OF MOUTH 10/14/2007  . PNEUMOCYSTIS PNEUMONIA 10/14/2007  . CUTANEOUS ERUPTIONS, DRUG-INDUCED 10/14/2007    Past Medical History:  Diagnosis Date  . AIDS (HCC) 07/12/2014  . AKI (acute kidney injury) (HCC) 08/12/2017  . Anxiety   . Arthritis    "hips, shoulders" (03/21/2017)  . Bilateral bunions    left foot worse  . Coronary artery disease involving native coronary artery 01/12/2015  . Diabetes mellitus type 2, controlled, without complications (HCC) 01/12/2015  . Diabetes type 2, controlled (HCC) 07/12/2014  . High triglycerides   . HTN (hypertension)   . Human immunodeficiency virus (HIV) disease (HCC) d'x 09/2007  . Hyperlipidemia   . Hypothyroid   . 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 BRONCHOSCOPY  09/29/2007   Bronchoalveolar lavage was  obtained from the left lower lobe.  Under fluoroscopy, transbronchial Hattie Perch/notes 07/04/2010  . CONDYLOMA EXCISION/FULGURATION  ~ 1996   anal/notes 07/04/2010    Family History  Problem  Relation Age of Onset  . Colon cancer Father   . Leukemia Maternal Grandfather     Social History   Substance and Sexual Activity  Drug Use Yes  . Types: Marijuana, Cocaine   Comment: 03/21/2017 "no cocaine for a few years; I smoke marijuana qd"  ,  Social History   Substance and Sexual Activity  Alcohol Use Yes   Comment: 03/21/2017 "none since 01/12/2017"  ,  Social History   Tobacco Use  Smoking Status Never Smoker  Smokeless Tobacco Never Used  ,  Social History   Substance and Sexual Activity  Sexual Activity Never  . Partners: Female    Patient's Medications  New Prescriptions   No medications on file  Previous Medications   ALPRAZOLAM (XANAX) 0.5 MG TABLET    TAKE 1 TABLET BY MOUTH 3 TIMES DAILY   BIKTARVY 50-200-25 MG TABS TABLET    TAKE 1 TABLET BY MOUTH DAILY   CETIRIZINE (ZYRTEC) 10 MG TABLET    TAKE 1 TABLET (10 MG TOTAL) BY MOUTH  DAILY.   CHOLECALCIFEROL (VITAMIN D3) 5000 UNITS CAPS    Take 1 capsule (5,000 Units total) by mouth daily.   [employer] LANCETS ORIGINAL MISC    1 application by Does not apply route daily.   GEMFIBROZIL (LOPID) 600 MG TABLET    TAKE 1/2 TABLET BY MOUTH TWICE A DAY   GLUCOSE BLOOD (ACCU-CHEK AVIVA) TEST STRIP    Use as instructed   HYDROCHLOROTHIAZIDE (MICROZIDE) 12.5 MG CAPSULE    TAKE 1 CAPSULE BY MOUTH EVERY DAY   LEVOTHYROXINE (SYNTHROID, LEVOTHROID) 25 MCG TABLET    TAKE 1 TABLET BY MOUTH EVERY DAY ON AN EMPTY STOMACH   LISINOPRIL (PRINIVIL,ZESTRIL) 40 MG TABLET    Take 1 tablet (40 mg total) by mouth daily.   METFORMIN (GLUCOPHAGE) 500 MG TABLET    Take 0.5 tablets (250 mg total) by mouth 2 (two) times daily with a meal.   MISC NATURAL PRODUCTS (OSTEO BI-FLEX JOINT SHIELD PO)    Take 1 tablet by mouth daily.   MULTIPLE VITAMINS-MINERALS (ICAPS AREDS 2 PO)    Take by mouth.   PRAVASTATIN (PRAVACHOL) 20 MG TABLET    TAKE 1 TABLET BY MOUTH EVERYDAY AT BEDTIME   ZINC GLUCONATE 50 MG TABLET    Take 50 mg by mouth daily.  Modified  Medications   No medications on file  Discontinued Medications   No medications on file    Sulfamethoxazole-trimethoprim  Review of Systems: General:   Denies fever, chills, unexplained weight loss.  Optho/Auditory:   Denies visual changes, blurred vision/LOV Respiratory:   Denies SOB, DOE more than baseline levels.  Cardiovascular:   Denies chest pain, palpitations, new onset peripheral edema  Gastrointestinal:   Denies nausea, vomiting, diarrhea.  Genitourinary: Denies dysuria, freq/ urgency, flank pain or discharge from genitals.  Endocrine:     Denies hot or cold intolerance, polyuria, polydipsia. Musculoskeletal:   Denies unexplained myalgias, joint swelling, unexplained arthralgias, gait problems.  Skin:  Denies rash, suspicious lesions Neurological:     Denies dizziness, unexplained weakness, numbness  Psychiatric/Behavioral:   Denies mood changes, suicidal or homicidal ideations, hallucinations    Objective:     Blood pressure 113/75, pulse 67, temperature 98.3 F (36.8 C), height 5\' 9"  (1.753 m), weight 202 lb (91.6 kg), SpO2 98 %. Body mass index is 29.83 kg/m. General Appearance:    Alert, cooperative, no distress, appears stated age  Head:    Normocephalic, without obvious abnormality, atraumatic  Eyes:    PERRL, conjunctiva/corneas clear, EOM's intact, fundi    benign, both eyes  Ears:    Normal TM's and external ear canals, both ears  Nose:   Nares normal, septum midline, mucosa normal, no drainage    or sinus tenderness  Throat:   Lips w/o lesion, mucosa moist, and tongue normal; teeth and   gums normal  Neck:   Supple, symmetrical, trachea midline, no adenopathy;    thyroid:  no enlargement/tenderness/nodules; no carotid   bruit or JVD  Back:     Symmetric, no curvature, ROM normal, no CVA tenderness  Lungs:     Clear to auscultation bilaterally, respirations unlabored, no       Wh/ R/ R  Chest Wall:    No tenderness or gross deformity; normal excursion    Heart:    Regular rate and rhythm, S1 and S2 normal, no murmur, rub   or gallop  Abdomen:    Central abdominal hernia present along the linea alba that is easily reducible. Soft, non-tender,  bowel sounds active all four quadrants, NO   G/R/R, no masses, no organomegaly  Genitalia:   Ext genitalia: without lesion, no penile rash or discharge, no hernias appreciated   Rectal:   Normal tone, prostate, lobes slightly enlarged bilaterally, but smooth and equal bilaterally, no tenderness; guaiac negative stool  Extremities:   Extremities normal, atraumatic, no cyanosis or gross edema  Pulses:   2+ and symmetric all extremities  Skin:   Warm, dry, Skin color, texture, turgor normal, no obvious rashes or lesions  M-Sk:   Ambulates * 4 w/o difficulty, no gross deformities, tone WNL  Neurologic:   CNII-XII intact, normal strength, sensation and reflexes    Throughout Psych:  No HI/SI, judgement and insight good, Euthymic mood. Full Affect.

## 2018-01-21 NOTE — Patient Instructions (Addendum)
Please make sure you get in touch with your gastroenterologist about repeating your colonoscopy.  You were due to have it this past March 2019 so hence you are over 6 months overdue.  This is very important   You will need cholesterol panel in May 2019-that so we can get a full set of blood work.  Your last hemoglobin A1c for your diabetes was 5.9 on 10\14\19.  Let us recheck this in 6 months which would be May.       Preventive Care for Adults, Male A healthy lifestyle and preventive care can promote health and wellness. Preventive health guidelines for men include the following key practices:  A routine yearly physical is a good way to check with your health care provider about your health and preventative screening. It is a chance to share any concerns and updates on your health and to receive a thorough exam.  Visit your dentist for a routine exam and preventative care every 6 months. Brush your teeth twice a day and floss once a day. Good oral hygiene prevents tooth decay and gum disease.  The frequency of eye exams is based on your age, health, family medical history, use of contact lenses, and other factors. Follow your health care provider's recommendations for frequency of eye exams.  Eat a healthy diet. Foods such as vegetables, fruits, whole grains, low-fat dairy products, and lean protein foods contain the nutrients you need without too many calories. Decrease your intake of foods high in solid fats, added sugars, and salt. Eat the right amount of calories for you.Get information about a proper diet from your health care provider, if necessary.  Regular physical exercise is one of the most important things you can do for your health. Most adults should get at least 150 minutes of moderate-intensity exercise (any activity that increases your heart rate and causes you to sweat) each week. In addition, most adults need muscle-strengthening exercises on 2 or more days a  week.  Maintain a healthy weight. The body mass index (BMI) is a screening tool to identify possible weight problems. It provides an estimate of body fat based on height and weight. Your health care provider can find your BMI and can help you achieve or maintain a healthy weight.For adults 20 years and older:  A BMI below 18.5 is considered underweight.  A BMI of 18.5 to 24.9 is normal.  A BMI of 25 to 29.9 is considered overweight.  A BMI of 30 and above is considered obese.  Maintain normal blood lipids and cholesterol levels by exercising and minimizing your intake of saturated fat. Eat a balanced diet with plenty of fruit and vegetables. Blood tests for lipids and cholesterol should begin at age 26 and be repeated every 5 years. If your lipid or cholesterol levels are high, you are over 50, or you are at high risk for heart disease, you may need your cholesterol levels checked more frequently.Ongoing high lipid and cholesterol levels should be treated with medicines if diet and exercise are not working.  If you smoke, find out from your health care provider how to quit. If you do not use tobacco, do not start.  Lung cancer screening is recommended for adults aged 9-80 years who are at high risk for developing lung cancer because of a history of smoking. A yearly low-dose CT scan of the lungs is recommended for people who have at least a 30-pack-year history of smoking and are a current smoker or have quit  within the past 15 years. A pack year of smoking is smoking an average of 1 pack of cigarettes a day for 1 year (for example: 1 pack a day for 30 years or 2 packs a day for 15 years). Yearly screening should continue until the smoker has stopped smoking for at least 15 years. Yearly screening should be stopped for people who develop a health problem that would prevent them from having lung cancer treatment.  If you choose to drink alcohol, do not have more than 2 drinks per day. One drink  is considered to be 12 ounces (355 mL) of beer, 5 ounces (148 mL) of wine, or 1.5 ounces (44 mL) of liquor.  Avoid use of street drugs. Do not share needles with anyone. Ask for help if you need support or instructions about stopping the use of drugs.  High blood pressure causes heart disease and increases the risk of stroke. Your blood pressure should be checked at least every 1-2 years. Ongoing high blood pressure should be treated with medicines, if weight loss and exercise are not effective.  If you are 59-67 years old, ask your health care provider if you should take aspirin to prevent heart disease.  Diabetes screening is done by taking a blood sample to check your blood glucose level after you have not eaten for a certain period of time (fasting). If you are not overweight and you do not have risk factors for diabetes, you should be screened once every 3 years starting at age 10. If you are overweight or obese and you are 32-53 years of age, you should be screened for diabetes every year as part of your cardiovascular risk assessment.  Colorectal cancer can be detected and often prevented. Most routine colorectal cancer screening begins at the age of 63 and continues through age 41. However, your health care provider may recommend screening at an earlier age if you have risk factors for colon cancer. On a yearly basis, your health care provider may provide home test kits to check for hidden blood in the stool. Use of a small camera at the end of a tube to directly examine the colon (sigmoidoscopy or colonoscopy) can detect the earliest forms of colorectal cancer. Talk to your health care provider about this at age 32, when routine screening begins. Direct exam of the colon should be repeated every 5-10 years through age 72, unless early forms of precancerous polyps or small growths are found.  People who are at an increased risk for hepatitis B should be screened for this virus. You are considered  at high risk for hepatitis B if:  You were born in a country where hepatitis B occurs often. Talk with your health care provider about which countries are considered high risk.  Your parents were born in a high-risk country and you have not received a shot to protect against hepatitis B (hepatitis B vaccine).  You have HIV or AIDS.  You use needles to inject street drugs.  You live with, or have sex with, someone who has hepatitis B.  You are a man who has sex with other men (MSM).  You get hemodialysis treatment.  You take certain medicines for conditions such as cancer, organ transplantation, and autoimmune conditions.  Hepatitis C blood testing is recommended for all people born from [birth year] through 1965 and any individual with known risks for hepatitis C.  Practice safe sex. Use condoms and avoid high-risk sexual practices to reduce the spread of sexually  transmitted infections (STIs). STIs include gonorrhea, chlamydia, syphilis, trichomonas, herpes, HPV, and human immunodeficiency virus (HIV). Herpes, HIV, and HPV are viral illnesses that have no cure. They can result in disability, cancer, and death.  If you are a man who has sex with other men, you should be screened at least once per year for:  HIV.  Urethral, rectal, and pharyngeal infection of gonorrhea, chlamydia, or both.  If you are at risk of being infected with HIV, it is recommended that you take a prescription medicine daily to prevent HIV infection. This is called preexposure prophylaxis (PrEP). You are considered at risk if:  You are a man who has sex with other men (MSM) and have other risk factors.  You are a heterosexual man, are sexually active, and are at increased risk for HIV infection.  You take drugs by injection.  You are sexually active with a partner who has HIV.  Talk with your health care provider about whether you are at high risk of being infected with HIV. If you choose to begin PrEP, you should  first be tested for HIV. You should then be tested every 3 months for as long as you are taking PrEP.  A one-time screening for abdominal aortic aneurysm (AAA) and surgical repair of large AAAs by ultrasound are recommended for men ages 63 to 65 years who are current or former smokers.  Healthy men should no longer receive prostate-specific antigen (PSA) blood tests as part of routine cancer screening. Talk with your health care provider about prostate cancer screening.  Testicular cancer screening is not recommended for adult males who have no symptoms. Screening includes self-exam, a health care provider exam, and other screening tests. Consult with your health care provider about any symptoms you have or any concerns you have about testicular cancer.  Use sunscreen. Apply sunscreen liberally and repeatedly throughout the day. You should seek shade when your shadow is shorter than you. Protect yourself by wearing long sleeves, pants, a wide-brimmed hat, and sunglasses year round, whenever you are outdoors.  Once a month, do a whole-body skin exam, using a mirror to look at the skin on your back. Tell your health care provider about new moles, moles that have irregular borders, moles that are larger than a pencil eraser, or moles that have changed in shape or color.  Stay current with required vaccines (immunizations).  Influenza vaccine. All adults should be immunized every year.  Tetanus, diphtheria, and acellular pertussis (Td, Tdap) vaccine. An adult who has not previously received Tdap or who does not know his vaccine status should receive 1 dose of Tdap. This initial dose should be followed by tetanus and diphtheria toxoids (Td) booster doses every 10 years. Adults with an unknown or incomplete history of completing a 3-dose immunization series with Td-containing vaccines should begin or complete a primary immunization series including a Tdap dose. Adults should receive a Td booster every 10  years.  Varicella vaccine. An adult without evidence of immunity to varicella should receive 2 doses or a second dose if he has previously received 1 dose.  Human papillomavirus (HPV) vaccine. Males aged 11-21 years who have not received the vaccine previously should receive the 3-dose series. Males aged 22-26 years may be immunized. Immunization is recommended through the age of 40 years for any male who has sex with males and did not get any or all doses earlier. Immunization is recommended for any person with an immunocompromised condition through the age of 17  years if he did not get any or all doses earlier. During the 3-dose series, the second dose should be obtained 4-8 weeks after the first dose. The third dose should be obtained 24 weeks after the first dose and 16 weeks after the second dose.  Zoster vaccine. One dose is recommended for adults aged 50 years or older unless certain conditions are present.  Measles, mumps, and rubella (MMR) vaccine. Adults born before [birth year] generally are considered immune to measles and mumps. Adults born in [birth year] or later should have 1 or more doses of MMR vaccine unless there is a contraindication to the vaccine or there is laboratory evidence of immunity to each of the three diseases. A routine second dose of MMR vaccine should be obtained at least 28 days after the first dose for students attending postsecondary schools, health care workers, or international travelers. People who received inactivated measles vaccine or an unknown type of measles vaccine during 1963-1967 should receive 2 doses of MMR vaccine. People who received inactivated mumps vaccine or an unknown type of mumps vaccine before 1979 and are at high risk for mumps infection should consider immunization with 2 doses of MMR vaccine. Unvaccinated health care workers born before [birth year] who lack laboratory evidence of measles, mumps, or rubella immunity or laboratory confirmation of disease should  consider measles and mumps immunization with 2 doses of MMR vaccine or rubella immunization with 1 dose of MMR vaccine.  Pneumococcal 13-valent conjugate (PCV13) vaccine. When indicated, a person who is uncertain of his immunization history and has no record of immunization should receive the PCV13 vaccine. All adults 19 years of age and older should receive this vaccine. An adult aged 49 years or older who has certain medical conditions and has not been previously immunized should receive 1 dose of PCV13 vaccine. This PCV13 should be followed with a dose of pneumococcal polysaccharide (PPSV23) vaccine. Adults who are at high risk for pneumococcal disease should obtain the PPSV23 vaccine at least 8 weeks after the dose of PCV13 vaccine. Adults older than 60 years of age who have normal immune system function should obtain the PPSV23 vaccine dose at least 1 year after the dose of PCV13 vaccine.  Pneumococcal polysaccharide (PPSV23) vaccine. When PCV13 is also indicated, PCV13 should be obtained first. All adults aged 60 years and older should be immunized. An adult younger than age 40 years who has certain medical conditions should be immunized. Any person who resides in a nursing home or long-term care facility should be immunized. An adult smoker should be immunized. People with an immunocompromised condition and certain other conditions should receive both PCV13 and PPSV23 vaccines. People with human immunodeficiency virus (HIV) infection should be immunized as soon as possible after diagnosis. Immunization during chemotherapy or radiation therapy should be avoided. Routine use of PPSV23 vaccine is not recommended for American Indians, [place] Natives, or people younger than 65 years unless there are medical conditions that require PPSV23 vaccine. When indicated, people who have unknown immunization and have no record of immunization should receive PPSV23 vaccine. One-time revaccination 5 years after the first  dose of PPSV23 is recommended for people aged 19-64 years who have chronic kidney failure, nephrotic syndrome, asplenia, or immunocompromised conditions. People who received 1-2 doses of PPSV23 before age 64 years should receive another dose of PPSV23 vaccine at age 64 years or later if at least 5 years have passed since the previous dose. Doses of PPSV23 are not needed for people immunized with PPSV23  at or after age 31 years.  Meningococcal vaccine. Adults with asplenia or persistent complement component deficiencies should receive 2 doses of quadrivalent meningococcal conjugate (MenACWY-D) vaccine. The doses should be obtained at least 2 months apart. Microbiologists working with certain meningococcal bacteria, [employer] recruits, people at risk during an outbreak, and people who travel to or live in countries with a high rate of meningitis should be immunized. A first-year college student up through age 24 years who is living in a residence hall should receive a dose if he did not receive a dose on or after his 16th birthday. Adults who have certain high-risk conditions should receive one or more doses of vaccine.  Hepatitis A vaccine. Adults who wish to be protected from this disease, have chronic liver disease, work with hepatitis A-infected animals, work in hepatitis A research labs, or travel to or work in countries with a high rate of hepatitis A should be immunized. Adults who were previously unvaccinated and who anticipate close contact with an international adoptee during the first 60 days after arrival in the [place] from a country with a high rate of hepatitis A should be immunized.  Hepatitis B vaccine. Adults should be immunized if they wish to be protected from this disease, are under age 37 years and have diabetes, have chronic liver disease, have had more than one sex partner in the past 6 months, may be exposed to blood or other infectious body fluids, are household contacts or sex  partners of hepatitis B positive people, are clients or workers in certain care facilities, or travel to or work in countries with a high rate of hepatitis B.  Haemophilus influenzae type b (Hib) vaccine. A previously unvaccinated person with asplenia or sickle cell disease or having a scheduled splenectomy should receive 1 dose of Hib vaccine. Regardless of previous immunization, a recipient of a hematopoietic stem cell transplant should receive a 3-dose series 6-12 months after his successful transplant. Hib vaccine is not recommended for adults with HIV infection. Preventive Service / Frequency Ages 29 to 7  Blood pressure check.** / Every 3-5 years.  Lipid and cholesterol check.** / Every 5 years beginning at age 69.  Hepatitis C blood test.** / For any individual with known risks for hepatitis C.  Skin self-exam. / Monthly.  Influenza vaccine. / Every year.  Tetanus, diphtheria, and acellular pertussis (Tdap, Td) vaccine.** / Consult your health care provider. 1 dose of Td every 10 years.  Varicella vaccine.** / Consult your health care provider.  HPV vaccine. / 3 doses over 6 months, if 81 or younger.  Measles, mumps, rubella (MMR) vaccine.** / You need at least 1 dose of MMR if you were born in 1957 or later. You may also need a second dose.  Pneumococcal 13-valent conjugate (PCV13) vaccine.** / Consult your health care provider.  Pneumococcal polysaccharide (PPSV23) vaccine.** / 1 to 2 doses if you smoke cigarettes or if you have certain conditions.  Meningococcal vaccine.** / 1 dose if you are age 47 to 22 years and a Market researcher living in a residence hall, or have one of several medical conditions. You may also need additional booster doses.  Hepatitis A vaccine.** / Consult your health care provider.  Hepatitis B vaccine.** / Consult your health care provider.  Haemophilus influenzae type b (Hib) vaccine.** / Consult your health care provider. Ages 97 to  5  Blood pressure check.** / Every year.  Lipid and cholesterol check.** / Every 5 years beginning  at age 20.  Lung cancer screening. / Every year if you are aged 76-80 years and have a 30-pack-year history of smoking and currently smoke or have quit within the past 15 years. Yearly screening is stopped once you have quit smoking for at least 15 years or develop a health problem that would prevent you from having lung cancer treatment.  Fecal occult blood test (FOBT) of stool. / Every year beginning at age 30 and continuing until age 29. You may not have to do this test if you get a colonoscopy every 10 years.  Flexible sigmoidoscopy** or colonoscopy.** / Every 5 years for a flexible sigmoidoscopy or every 10 years for a colonoscopy beginning at age 64 and continuing until age 77.  Hepatitis C blood test.** / For all people born from [birth year] through 1965 and any individual with known risks for hepatitis C.  Skin self-exam. / Monthly.  Influenza vaccine. / Every year.  Tetanus, diphtheria, and acellular pertussis (Tdap/Td) vaccine.** / Consult your health care provider. 1 dose of Td every 10 years.  Varicella vaccine.** / Consult your health care provider.  Zoster vaccine.** / 1 dose for adults aged 17 years or older.  Measles, mumps, rubella (MMR) vaccine.** / You need at least 1 dose of MMR if you were born in 1955 or later. You may also need a second dose.  Pneumococcal 13-valent conjugate (PCV13) vaccine.** / Consult your health care provider.  Pneumococcal polysaccharide (PPSV23) vaccine.** / 1 to 2 doses if you smoke cigarettes or if you have certain conditions.  Meningococcal vaccine.** / Consult your health care provider.  Hepatitis A vaccine.** / Consult your health care provider.  Hepatitis B vaccine.** / Consult your health care provider.  Haemophilus influenzae type b (Hib) vaccine.** / Consult your health care provider. Ages 66 and over  Blood pressure check.** /  Every year.  Lipid and cholesterol check.**/ Every 5 years beginning at age 52.  Lung cancer screening. / Every year if you are aged 64-80 years and have a 30-pack-year history of smoking and currently smoke or have quit within the past 15 years. Yearly screening is stopped once you have quit smoking for at least 15 years or develop a health problem that would prevent you from having lung cancer treatment.  Fecal occult blood test (FOBT) of stool. / Every year beginning at age 93 and continuing until age 76. You may not have to do this test if you get a colonoscopy every 10 years.  Flexible sigmoidoscopy** or colonoscopy.** / Every 5 years for a flexible sigmoidoscopy or every 10 years for a colonoscopy beginning at age 48 and continuing until age 51.  Hepatitis C blood test.** / For all people born from [birth year] through 1963 and any individual with known risks for hepatitis C.  Abdominal aortic aneurysm (AAA) screening.** / A one-time screening for ages 65 to 28 years who are current or former smokers.  Skin self-exam. / Monthly.  Influenza vaccine. / Every year.  Tetanus, diphtheria, and acellular pertussis (Tdap/Td) vaccine.** / 1 dose of Td every 10 years.  Varicella vaccine.** / Consult your health care provider.  Zoster vaccine.** / 1 dose for adults aged 37 years or older.  Pneumococcal 13-valent conjugate (PCV13) vaccine.** / 1 dose for all adults aged 2 years and older.  Pneumococcal polysaccharide (PPSV23) vaccine.** / 1 dose for all adults aged 58 years and older.  Meningococcal vaccine.** / Consult your health care provider.  Hepatitis A vaccine.** / Consult your health  care provider.  Hepatitis B vaccine.** / Consult your health care provider.  Haemophilus influenzae type b (Hib) vaccine.** / Consult your health care provider. **Family history and personal history of risk and conditions may change your health care provider's recommendations.   This information is not  intended to replace advice given to you by your health care provider. Make sure you discuss any questions you have with your health care provider.   Document Released: 04/17/2001 Document Revised: 03/12/2014 Document Reviewed: 07/17/2010 Elsevier Interactive Patient Education [employer].

## 2018-02-10 ENCOUNTER — Ambulatory Visit: Payer: Self-pay | Admitting: Family Medicine

## 2018-02-17 ENCOUNTER — Other Ambulatory Visit: Payer: Self-pay | Admitting: Family Medicine

## 2018-02-17 DIAGNOSIS — E119 Type 2 diabetes mellitus without complications: Secondary | ICD-10-CM

## 2018-02-17 DIAGNOSIS — E038 Other specified hypothyroidism: Secondary | ICD-10-CM

## 2018-02-17 DIAGNOSIS — E1121 Type 2 diabetes mellitus with diabetic nephropathy: Secondary | ICD-10-CM

## 2018-02-17 DIAGNOSIS — I10 Essential (primary) hypertension: Secondary | ICD-10-CM

## 2018-03-13 LAB — CBC
ABSOLUTE CD4: 729 /uL (ref 359–1519)
CD4%: 27 % — ABNORMAL LOW (ref 30.8–58.5)
CD8 % Suppressor T Cell: 49.6 35.5 — ABNORMAL HIGH (ref 49.6–12)
CD8 T Cell Abs: 1339 /uL — ABNORMAL HIGH (ref 109–897)
HCT: 40 % (ref 29–41)
Hemoglobin: 14.3 g/dL (ref 13–17.7)

## 2018-03-13 LAB — HIV RNA, QUANTITATIVE, PCR: HIV 1 RNA QUANT: NOT DETECTED {copies} (ref 40–40)

## 2018-03-15 ENCOUNTER — Other Ambulatory Visit: Payer: Self-pay | Admitting: Family Medicine

## 2018-03-15 DIAGNOSIS — I10 Essential (primary) hypertension: Secondary | ICD-10-CM

## 2018-04-01 ENCOUNTER — Other Ambulatory Visit: Payer: Self-pay | Admitting: Infectious Disease

## 2018-04-01 DIAGNOSIS — F419 Anxiety disorder, unspecified: Secondary | ICD-10-CM

## 2018-04-02 ENCOUNTER — Other Ambulatory Visit: Payer: Self-pay

## 2018-04-02 DIAGNOSIS — F419 Anxiety disorder, unspecified: Secondary | ICD-10-CM

## 2018-04-02 MED ORDER — ALPRAZOLAM 0.5 MG PO TABS: 0.5000 mg | ORAL_TABLET | Freq: Three times a day (TID) | ORAL | 1 refills | Status: DC

## 2018-04-17 ENCOUNTER — Ambulatory Visit: Payer: Self-pay | Admitting: Family Medicine

## 2018-05-02 ENCOUNTER — Other Ambulatory Visit: Payer: Self-pay | Admitting: Family Medicine

## 2018-05-02 DIAGNOSIS — I10 Essential (primary) hypertension: Secondary | ICD-10-CM

## 2018-05-07 ENCOUNTER — Other Ambulatory Visit: Payer: Self-pay | Admitting: Family Medicine

## 2018-05-07 DIAGNOSIS — E782 Mixed hyperlipidemia: Principal | ICD-10-CM

## 2018-05-07 DIAGNOSIS — E1169 Type 2 diabetes mellitus with other specified complication: Secondary | ICD-10-CM

## 2018-05-13 ENCOUNTER — Other Ambulatory Visit: Payer: Self-pay

## 2018-05-13 ENCOUNTER — Other Ambulatory Visit: Payer: Self-pay | Admitting: Infectious Disease

## 2018-05-13 DIAGNOSIS — F419 Anxiety disorder, unspecified: Secondary | ICD-10-CM

## 2018-05-13 MED ORDER — ALPRAZOLAM 0.5 MG PO TABS: 0.5000 mg | ORAL_TABLET | Freq: Three times a day (TID) | ORAL | 1 refills | Status: DC

## 2018-07-14 ENCOUNTER — Other Ambulatory Visit: Payer: Medicare Other

## 2018-07-14 ENCOUNTER — Other Ambulatory Visit: Payer: Self-pay

## 2018-07-14 DIAGNOSIS — E119 Type 2 diabetes mellitus without complications: Secondary | ICD-10-CM

## 2018-07-14 DIAGNOSIS — Z0000 Encounter for general adult medical examination without abnormal findings: Secondary | ICD-10-CM

## 2018-07-14 DIAGNOSIS — E559 Vitamin D deficiency, unspecified: Secondary | ICD-10-CM | POA: Diagnosis not present

## 2018-07-14 DIAGNOSIS — E039 Hypothyroidism, unspecified: Secondary | ICD-10-CM

## 2018-07-14 DIAGNOSIS — I10 Essential (primary) hypertension: Secondary | ICD-10-CM | POA: Diagnosis not present

## 2018-07-14 DIAGNOSIS — I152 Hypertension secondary to endocrine disorders: Secondary | ICD-10-CM

## 2018-07-14 DIAGNOSIS — E1169 Type 2 diabetes mellitus with other specified complication: Secondary | ICD-10-CM

## 2018-07-14 DIAGNOSIS — E1159 Type 2 diabetes mellitus with other circulatory complications: Secondary | ICD-10-CM | POA: Diagnosis not present

## 2018-07-15 LAB — TSH: TSH: 1.84 u[IU]/mL (ref 0.450–4.500)

## 2018-07-15 LAB — COMPREHENSIVE METABOLIC PANEL
ALT: 10 IU/L (ref 0–44)
AST: 14 IU/L (ref 0–40)
Albumin/Globulin Ratio: 1.7 (ref 1.2–2.2)
Albumin: 4.6 g/dL (ref 3.8–4.9)
Alkaline Phosphatase: 102 IU/L (ref 39–117)
BUN/Creatinine Ratio: 14 (ref 10–24)
BUN: 16 mg/dL (ref 8–27)
Bilirubin Total: 0.2 mg/dL (ref 0.0–1.2)
CO2: 26 mmol/L (ref 20–29)
Calcium: 9.8 mg/dL (ref 8.6–10.2)
Chloride: 102 mmol/L (ref 96–106)
Creatinine, Ser: 1.16 mg/dL (ref 0.76–1.27)
GFR calc Af Amer: 79 mL/min/{1.73_m2} (ref >59)
GFR calc non Af Amer: 68 mL/min/{1.73_m2} (ref >59)
Globulin, Total: 2.7 g/dL (ref 1.5–4.5)
Glucose: 116 mg/dL — ABNORMAL HIGH (ref 65–99)
Potassium: 5.2 mmol/L (ref 3.5–5.2)
Sodium: 141 mmol/L (ref 134–144)
Total Protein: 7.3 g/dL (ref 6.0–8.5)

## 2018-07-15 LAB — CBC WITH DIFFERENTIAL/PLATELET
Basophils Absolute: 0.1 10*3/uL (ref 0.0–0.2)
Basos: 1 %
EOS (ABSOLUTE): 0.3 10*3/uL (ref 0.0–0.4)
Eos: 4 %
Hematocrit: 42.3 % (ref 37.5–51.0)
Hemoglobin: 15 g/dL (ref 13.0–17.7)
Immature Grans (Abs): 0 10*3/uL (ref 0.0–0.1)
Immature Granulocytes: 1 %
Lymphocytes Absolute: 2.9 10*3/uL (ref 0.7–3.1)
Lymphs: 34 %
MCH: 33 pg (ref 26.6–33.0)
MCHC: 35.5 g/dL (ref 31.5–35.7)
MCV: 93 fL (ref 79–97)
Monocytes Absolute: 0.7 10*3/uL (ref 0.1–0.9)
Monocytes: 8 %
Neutrophils Absolute: 4.4 10*3/uL (ref 1.4–7.0)
Neutrophils: 52 %
Platelets: 248 10*3/uL (ref 150–450)
RBC: 4.55 x10E6/uL (ref 4.14–5.80)
RDW: 12.3 % (ref 11.6–15.4)
WBC: 8.3 10*3/uL (ref 3.4–10.8)

## 2018-07-15 LAB — VITAMIN D 25 HYDROXY (VIT D DEFICIENCY, FRACTURES): Vit D, 25-Hydroxy: 58.5 ng/mL (ref 30.0–100.0)

## 2018-07-15 LAB — LIPID PANEL
Chol/HDL Ratio: 4.3 ratio (ref 0.0–5.0)
Cholesterol, Total: 164 mg/dL (ref 100–199)
HDL: 38 mg/dL — ABNORMAL LOW (ref >39)
LDL Calculated: 63 mg/dL (ref 0–99)
Triglycerides: 317 mg/dL — ABNORMAL HIGH (ref 0–149)
VLDL Cholesterol Cal: 63 mg/dL — ABNORMAL HIGH (ref 5–40)

## 2018-07-15 LAB — HEMOGLOBIN A1C
Est. average glucose Bld gHb Est-mCnc: 128 mg/dL
Hgb A1c MFr Bld: 6.1 % — ABNORMAL HIGH (ref 4.8–5.6)

## 2018-07-17 ENCOUNTER — Other Ambulatory Visit: Payer: Self-pay | Admitting: Infectious Disease

## 2018-07-17 ENCOUNTER — Other Ambulatory Visit: Payer: Self-pay

## 2018-07-17 ENCOUNTER — Ambulatory Visit (INDEPENDENT_AMBULATORY_CARE_PROVIDER_SITE_OTHER): Payer: Medicare Other | Admitting: Family Medicine

## 2018-07-17 ENCOUNTER — Encounter: Payer: Self-pay | Admitting: Family Medicine

## 2018-07-17 VITALS — BP 115/68 | HR 59 | Ht 69.0 in | Wt 202.0 lb

## 2018-07-17 DIAGNOSIS — E559 Vitamin D deficiency, unspecified: Secondary | ICD-10-CM

## 2018-07-17 DIAGNOSIS — E119 Type 2 diabetes mellitus without complications: Secondary | ICD-10-CM | POA: Diagnosis not present

## 2018-07-17 DIAGNOSIS — E782 Mixed hyperlipidemia: Secondary | ICD-10-CM

## 2018-07-17 DIAGNOSIS — I10 Essential (primary) hypertension: Secondary | ICD-10-CM

## 2018-07-17 DIAGNOSIS — E781 Pure hyperglyceridemia: Secondary | ICD-10-CM

## 2018-07-17 DIAGNOSIS — E1159 Type 2 diabetes mellitus with other circulatory complications: Secondary | ICD-10-CM

## 2018-07-17 DIAGNOSIS — E1121 Type 2 diabetes mellitus with diabetic nephropathy: Secondary | ICD-10-CM | POA: Diagnosis not present

## 2018-07-17 DIAGNOSIS — I2510 Atherosclerotic heart disease of native coronary artery without angina pectoris: Secondary | ICD-10-CM | POA: Diagnosis not present

## 2018-07-17 DIAGNOSIS — B20 Human immunodeficiency virus [HIV] disease: Secondary | ICD-10-CM

## 2018-07-17 DIAGNOSIS — E1169 Type 2 diabetes mellitus with other specified complication: Secondary | ICD-10-CM

## 2018-07-17 MED ORDER — GEMFIBROZIL 600 MG PO TABS: 600.0000 mg | ORAL_TABLET | Freq: Two times a day (BID) | ORAL | 0 refills | Status: DC

## 2018-07-17 MED ORDER — VITAMIN D (ERGOCALCIFEROL) 1.25 MG (50000 UNIT) PO CAPS: ORAL_CAPSULE | ORAL | 3 refills | Status: DC

## 2018-07-17 NOTE — Progress Notes (Signed)
Telehealth office visit note for Jonathan Estes, D.O- at Primary Care at [facility]   I connected with current patient today and verified that I am speaking with the correct person using two identifiers.   . Location of the patient: Home . Location of the provider: Office Only the patient (+/- their family members at pt's discretion) and myself were participating in the encounter    - This visit type was conducted due to national recommendations for restrictions regarding the COVID-19 Pandemic (e.g. social distancing) in an effort to limit this patient's exposure and mitigate transmission in our community.  This format is felt to be most appropriate for this patient at this time.   - The patient did not have access to video technology or had technical difficulties with video requiring transitioning to audio format only. - No physical exam could be performed with this format, beyond that communicated to [place] by the patient/ family members as noted.   - Additionally my office staff/ schedulers discussed with the patient that there may be a monetary charge related to this service, depending on their medical insurance.   The patient expressed understanding, and agreed to proceed.       History of Present Illness:    HPI:  Jonathan B Amick34 y.o. male presents for routine chronic follow up for multiple medical problems.   --> here to review labs  Recent Results (from the past 2160 hour(s))  CBC with Differential/Platelet     Status: None   Collection Time: 07/14/18  9:01 AM  Result Value Ref Range   WBC 8.3 3.4 - 10.8 x10E3/uL   RBC 4.55 4.14 - 5.80 x10E6/uL   Hemoglobin 15.0 13.0 - 17.7 g/dL   Hematocrit 16.1 09.6 - 51.0 %   MCV 93 79 - 97 fL   MCH 33.0 26.6 - 33.0 pg   MCHC 35.5 31.5 - 35.7 g/dL   RDW 04.5 40.9 - 81.1 %   Platelets 248 150 - 450 x10E3/uL   Neutrophils 52 Not Estab. %   Lymphs 34 Not Estab. %   Monocytes 8 Not Estab. %   Eos 4 Not Estab. %   Basos 1 Not  Estab. %   Neutrophils Absolute 4.4 1.4 - 7.0 x10E3/uL   Lymphocytes Absolute 2.9 0.7 - 3.1 x10E3/uL   Monocytes Absolute 0.7 0.1 - 0.9 x10E3/uL   EOS (ABSOLUTE) 0.3 0.0 - 0.4 x10E3/uL   Basophils Absolute 0.1 0.0 - 0.2 x10E3/uL   Immature Granulocytes 1 Not Estab. %   Immature Grans (Abs) 0.0 0.0 - 0.1 x10E3/uL  Comprehensive metabolic panel     Status: Abnormal   Collection Time: 07/14/18  9:01 AM  Result Value Ref Range   Glucose 116 (H) 65 - 99 mg/dL   BUN 16 8 - 27 mg/dL   Creatinine, Ser 9.14 0.76 - 1.27 mg/dL   GFR calc non Af Amer 68 >59 mL/min/1.73   GFR calc Af Amer 79 >59 mL/min/1.73   BUN/Creatinine Ratio 14 10 - 24   Sodium 141 134 - 144 mmol/L   Potassium 5.2 3.5 - 5.2 mmol/L   Chloride 102 96 - 106 mmol/L   CO2 26 20 - 29 mmol/L   Calcium 9.8 8.6 - 10.2 mg/dL   Total Protein 7.3 6.0 - 8.5 g/dL   Albumin 4.6 3.8 - 4.9 g/dL   Globulin, Total 2.7 1.5 - 4.5 g/dL   Albumin/Globulin Ratio 1.7 1.2 - 2.2   Bilirubin Total 0.2  0.0 - 1.2 mg/dL   Alkaline Phosphatase 102 39 - 117 IU/L   AST 14 0 - 40 IU/L   ALT 10 0 - 44 IU/L  Hemoglobin A1c     Status: Abnormal   Collection Time: 07/14/18  9:01 AM  Result Value Ref Range   Hgb A1c MFr Bld 6.1 (H) 4.8 - 5.6 %    Comment:          Prediabetes: 5.7 - 6.4          Diabetes: >6.4          Glycemic control for adults with diabetes: <7.0    Est. average glucose Bld gHb Est-mCnc 128 mg/dL  Lipid panel     Status: Abnormal   Collection Time: 07/14/18  9:01 AM  Result Value Ref Range   Cholesterol, Total 164 100 - 199 mg/dL   Triglycerides 161 (H) 0 - 149 mg/dL   HDL 38 (L) >09 mg/dL   VLDL Cholesterol Cal 63 (H) 5 - 40 mg/dL   LDL Calculated 63 0 - 99 mg/dL   Chol/HDL Ratio 4.3 0.0 - 5.0 ratio    Comment:                                   T. Chol/HDL Ratio                                             Men  Women                               1/2 Avg.Risk  3.4    3.3                                   Avg.Risk  5.0    4.4                                 2X Avg.Risk  9.6    7.1                                3X Avg.Risk 23.4   11.0   TSH     Status: None   Collection Time: 07/14/18  9:01 AM  Result Value Ref Range   TSH 1.840 0.450 - 4.500 uIU/mL  VITAMIN D 25 Hydroxy (Vit-D Deficiency, Fractures)     Status: None   Collection Time: 07/14/18  9:01 AM  Result Value Ref Range   Vit D, 25-Hydroxy 58.5 30.0 - 100.0 ng/mL    Comment: Vitamin D deficiency has been defined by the Institute of Medicine and an Endocrine Society practice guideline as a level of serum 25-OH vitamin D less than 20 ng/mL (1,2). The Endocrine Society went on to further define vitamin D insufficiency as a level between 21 and 29 ng/mL (2). 1. IOM (Institute of Medicine). 2010. Dietary reference    intakes for calcium and D. [place]: The    [employer]. 2. Holick MF, Binkley , Bischoff-Ferrari HA, et al.    Evaluation, treatment, and prevention  of vitamin D    deficiency: an Endocrine Society clinical practice    guideline. JCEM. 2011 Jul; 96(7):1911-30.      1) Diabetes Type 2 Pt reports good compliance with medications and/ or treatment plan Denies medication or current treatment plan S-E  Home fasting glucose readings range -  not checking very much at all but recently was in the 90s.; 2 hr PP-  not checking  - Pt has been eating more cookies than usual.  - Denies polyuria/polydipsia. - Denies hypoglycemia symptoms  Last diabetic eye exam was  Lab Results  Component Value Date   HMDIABEYEEXA No Retinopathy 11/29/2016    Lab Results  Component Value Date   HGBA1C 6.1 (H) 07/14/2018   HGBA1C 5.9 (A) 12/16/2017   HGBA1C 5.5 07/23/2017    Lab Results  Component Value Date   MICROALBUR <0.2 08/12/2017   LDLCALC 63 07/14/2018   CREATININE 1.16 07/14/2018        2) Hyperlipidemia  Pt reports compliance with meds and/or treatment plan such as low sat/trans fat and low cholesterol diet and  routine exercise  Denies Medication S-E  (RUQ pain; Muscle aches )   Last lipid panel as follows:  Lab Results  Component Value Date   CHOL 164 07/14/2018   HDL 38 (L) 07/14/2018   LDLCALC 63 07/14/2018   TRIG 317 (H) 07/14/2018   CHOLHDL 4.3 07/14/2018    Lab Results  Component Value Date   ALT 10 07/14/2018        3) Hypertension  Pt reports compliance with medications and/ or treatment plan Denies medication S-E. Home Blood pressure range-  Checks it couple times per week-  115/69, P=61; 110/62 P=71 - switched to caffeine free drinks. Low salt diet-    No, but doesn't like salt  Exercise-    none Patient denies new onset of sx- no chest pain, dizziness, HA, DIB/ shortness of breath or swelling.  Lab Results  Component Value Date   CREATININE 1.16 07/14/2018    Last 3 blood pressure readings in our office are as follows: BP Readings from Last 3 Encounters:  07/17/18 115/68  01/21/18 113/75  01/14/18 104/69     4) Weight:  Wt Readings from Last 3 Encounters:  07/17/18 202 lb (91.6 kg)  01/21/18 202 lb (91.6 kg)  01/14/18 205 lb (93 kg)    BMI Readings from Last 3 Encounters:  07/17/18 29.83 kg/m  01/21/18 29.83 kg/m  01/14/18 30.27 kg/m       Patient Care Team    Relationship Specialty Notifications Start End  Jonathan Lot, DO PCP - General Family Medicine  11/26/16   Louis Meckel, MD (Inactive) Consulting Physician Gastroenterology  07/23/16   Daiva Eves, Lisette Grinder, MD Consulting Physician Infectious Diseases  12/31/16    Comment: HIV doc  Darden Amber, OD Referring Physician Optometry  12/31/16        Impression and Recommendations:    1. Controlled type 2 diabetes mellitus without complication, without long-term current use of insulin (HCC)   2. Diabetic nephropathy associated with type 2 diabetes mellitus: Microalbuminuria   3. Dyslipidemia with low high density lipoprotein (HDL) cholesterol with hypertriglyceridemia due to  type 2 diabetes mellitus (HCC)   4. Hypertension associated with diabetes (HCC)   5. Mixed diabetic hyperlipidemia associated with type 2 diabetes mellitus (HCC)   6. Coronary artery disease involving native heart without angina pectoris, unspecified vessel or lesion type   7. Human immunodeficiency virus (HIV)  disease (HCC)   8. Hypertriglyceridemia   9. Vitamin D deficiency     -A1c went up to 6.1 but we will recheck in 3 months.  No changes to diabetic regiment.  -LDL still under great control.  Triglycerides are up and over 300.  We will increase his gemfibrozil from a half a tablet twice daily to 1 tablet twice daily  - Prefers once wkly vit D  Over the daily.  Well controlled.   - As part of my medical decision making, I reviewed the following data within the electronic MEDICAL RECORD NUMBER History obtained from pt /family, CMA notes reviewed and incorporated if applicable, Labs reviewed, Radiograph/ tests reviewed if applicable and OV notes from prior OV's with me, as well as other specialists she/he has seen since seeing me last, were all reviewed and used in my medical decision making process today.   - Additionally, discussion had with patient regarding txmnt plan, and their biases/concerns about that plan were used in my medical decision making today.   - The patient agreed with the plan and demonstrated an understanding of the instructions.   No barriers to understanding were identified.   - Red flag symptoms and signs discussed in detail.  Patient expressed understanding regarding what to do in case of emergency\ urgent symptoms.  The patient was advised to call back or seek an in-person evaluation if the symptoms worsen or if the condition fails to improve as anticipated.   Return for diabetes, HTN, f- up every 3-4 mo- needs a1c.     Meds ordered this encounter  Medications  . gemfibrozil (LOPID) 600 MG tablet    Sig: Take 1 tablet (600 mg total) by mouth 2 (two) times daily.     Dispense:  180 tablet    Refill:  0  . Vitamin D, Ergocalciferol, (DRISDOL) 1.25 MG (50000 UT) CAPS capsule    Sig: Take one tablet wkly    Dispense:  12 capsule    Refill:  3    Medications Discontinued During This Encounter  Medication Reason  . gemfibrozil (LOPID) 600 MG tablet   . Cholecalciferol (VITAMIN D3) 5000 units CAPS Change in therapy      I provided 21+ minutes of non-face-to-face time during this encounter,with over 50% of the time in direct counseling on patients medical conditions/ medical concerns.  Additional time was spent with charting and coordination of care after the actual visit commenced.   Note:  This note was prepared with assistance of Dragon voice recognition software. Occasional wrong-word or sound-a-like substitutions may have occurred due to the inherent limitations of voice recognition software.  Jonathan Lot, DO     Patient Care Team    Relationship Specialty Notifications Start End  Jonathan Lot, DO PCP - General Family Medicine  11/26/16   Louis Meckel, MD (Inactive) Consulting Physician Gastroenterology  07/23/16   Daiva Eves, Lisette Grinder, MD Consulting Physician Infectious Diseases  12/31/16    Comment: HIV doc  Darden Amber, OD Referring Physician Optometry  12/31/16      -Vitals obtained; medications/ allergies reconciled;  personal medical, social, Sx etc.histories were updated by CMA, reviewed by me and are reflected in chart   Patient Active Problem List   Diagnosis Date Noted  . Microalbuminuria due to type 2 diabetes mellitus (HCC) 12/31/2016    Priority: High  . Dyslipidemia with low high density lipoprotein (HDL) cholesterol with hypertriglyceridemia due to type 2 diabetes mellitus (HCC) 07/23/2016    Priority:  High  . Diabetic nephropathy associated with type 2 diabetes mellitus: Microalbuminuria 07/23/2016    Priority: High  . Mixed diabetic hyperlipidemia associated with type 2 diabetes mellitus (HCC) 07/11/2010     Priority: High  . Controlled type 2 diabetes mellitus without complication, without long-term current use of insulin (HCC) 02/07/2009    Priority: High  . Hypertension associated with diabetes (HCC) 04/12/2008    Priority: High  . Family history of colon cancer in father- in 29's 11/26/2016    Priority: Medium  . CAD (coronary artery disease) 07/16/2011    Priority: Medium  . Hypothyroidism 04/24/2008    Priority: Medium  . Human immunodeficiency virus (HIV) disease (HCC) 10/14/2007    Priority: Medium  . Uveitis 03/21/2017    Priority: Low  . Vitamin D deficiency 12/31/2016    Priority: Low  . Environmental and seasonal allergies 07/30/2016    Priority: Low  . Anxiety 01/29/2011    Priority: Low  . DEPRESSION/ANXIETY 06/27/2009    Priority: Low  . h/o COCAINE ABUSE, EPISODIC 10/14/2007    Priority: Low  . CANNABIS ABUSE, HX OF 10/14/2007    Priority: Low  . AKI (acute kidney injury) (HCC) 08/12/2017  . 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 Change in vision 03/21/2017  . Low level of high density lipoprotein (HDL) 12/30/2016  . Hypertriglyceridemia 12/30/2016  . External hemorrhoids without complication 11/26/2016  . Dyspnea 05/10/2011  . Abnormal chest CT 04/12/2011  . HTN (hypertension)   . PULMONARY NODULE 02/21/2010  . ORBITAL FLOOR , CLOSED FRACTURE 02/21/2010  . UNSPECIFIED VISUAL LOSS 12/26/2009  . TRANSIENT GLOBAL AMNESIA 12/26/2009  . SHOULDER STRAIN 06/13/2009  . FATIGUE 04/12/2008  . METHICILLIN SUSCEPTIBLE STAPH INF CCE & UNS SITE 10/14/2007  . CANDIDIASIS OF MOUTH 10/14/2007  . PNEUMOCYSTIS PNEUMONIA 10/14/2007  . CUTANEOUS ERUPTIONS, DRUG-INDUCED 10/14/2007     Current Meds  Medication Sig  . ALPRAZolam (XANAX) 0.5 MG tablet Take 1 tablet (0.5 mg total) by mouth 3 (three) times daily.  Marland Kitchen BIKTARVY 50-200-25 MG TABS tablet TAKE 1 TABLET BY MOUTH DAILY  . cetirizine (ZYRTEC) 10 MG tablet TAKE 1 TABLET (10 MG TOTAL) BY MOUTH DAILY.  . [facility] LANCETS ORIGINAL MISC  1 application by Does not apply route daily.  Marland Kitchen gemfibrozil (LOPID) 600 MG tablet Take 1 tablet (600 mg total) by mouth 2 (two) times daily.  Marland Kitchen glucose blood (ACCU-CHEK AVIVA) test strip Use as instructed  . hydrochlorothiazide (MICROZIDE) 12.5 MG capsule TAKE 1 CAPSULE BY MOUTH EVERY DAY  . levothyroxine (SYNTHROID, LEVOTHROID) 25 MCG tablet TAKE 1 TABLET BY MOUTH EVERY DAY ON AN EMPTY STOMACH  . lisinopril (PRINIVIL,ZESTRIL) 40 MG tablet TAKE 1 TABLET BY MOUTH EVERY DAY  . metFORMIN (GLUCOPHAGE) 500 MG tablet TAKE 1 TABLET BY MOUTH TWICE A DAY WITH MEALS (Patient taking differently: Take 250 mg by mouth 2 (two) times daily with a meal. Give w/food.)  . Misc Natural Products (OSTEO BI-FLEX JOINT SHIELD PO) Take 1 tablet by mouth daily.  . Multiple Vitamins-Minerals (ICAPS AREDS 2 PO) Take by mouth.  . pravastatin (PRAVACHOL) 20 MG tablet TAKE 1 TABLET BY MOUTH EVERYDAY AT BEDTIME  . zinc gluconate 50 MG tablet Take 50 mg by mouth daily.  . [DISCONTINUED] Cholecalciferol (VITAMIN D3) 5000 units CAPS Take 1 capsule (5,000 Units total) by mouth daily.  . [DISCONTINUED] gemfibrozil (LOPID) 600 MG tablet TAKE 1/2 TABLET BY MOUTH TWICE A DAY     Allergies:  Allergies  Allergen Reactions  . Sulfamethoxazole-Trimethoprim  REACTION: severe rash     ROS:  See above HPI for pertinent positives and negatives   Objective:   Blood pressure 115/68, pulse (!) 59, height 5\' 9"  (1.753 m), weight 202 lb (91.6 kg).  (if some vitals are omitted, this means that patient was UNABLE to obtain them even though they were asked to get them prior to OV today.  They were asked to call us at their earliest convenience with these once obtained. )  General: A & O * 3; sounds in no acute distress; in usual state of health.  Skin: Pt confirms warm and dry extremities and pink fingertips HEENT: Pt confirms lips non-cyanotic Chest: Patient confirms normal chest excursion and movement Respiratory: speaking in  full sentences, no conversational dyspnea; patient confirms no use of accessory muscles Psych: insight appears good, mood- appears full

## 2018-07-30 ENCOUNTER — Encounter (INDEPENDENT_AMBULATORY_CARE_PROVIDER_SITE_OTHER): Payer: Medicare Other | Admitting: *Deleted

## 2018-07-30 ENCOUNTER — Other Ambulatory Visit: Payer: Self-pay | Admitting: Infectious Disease

## 2018-07-30 ENCOUNTER — Other Ambulatory Visit: Payer: Self-pay

## 2018-07-30 ENCOUNTER — Other Ambulatory Visit: Payer: Medicare Other

## 2018-07-30 ENCOUNTER — Other Ambulatory Visit (HOSPITAL_COMMUNITY)
Admission: RE | Admit: 2018-07-30 | Discharge: 2018-07-30 | Disposition: A | Discharge status: Home / Self Care | Payer: Medicare Other | Source: Ambulatory Visit | Attending: Infectious Disease | Admitting: Infectious Disease

## 2018-07-30 VITALS — BP 119/65 | HR 70 | Temp 97.8°F | Wt 203.5 lb

## 2018-07-30 DIAGNOSIS — I2510 Atherosclerotic heart disease of native coronary artery without angina pectoris: Secondary | ICD-10-CM | POA: Diagnosis not present

## 2018-07-30 DIAGNOSIS — E1121 Type 2 diabetes mellitus with diabetic nephropathy: Secondary | ICD-10-CM

## 2018-07-30 DIAGNOSIS — F419 Anxiety disorder, unspecified: Secondary | ICD-10-CM

## 2018-07-30 DIAGNOSIS — A5271 Late syphilitic oculopathy: Secondary | ICD-10-CM

## 2018-07-30 DIAGNOSIS — B20 Human immunodeficiency virus [HIV] disease: Secondary | ICD-10-CM | POA: Diagnosis present

## 2018-07-30 DIAGNOSIS — Z006 Encounter for examination for normal comparison and control in clinical research program: Secondary | ICD-10-CM

## 2018-07-30 LAB — HIV-1 RNA QUANT-NO REFLEX-BLD: HIV-1 RNA Viral Load: <40

## 2018-07-30 NOTE — Telephone Encounter (Signed)
Refill for Xanax 0.5 mg tab called into pharmacy. Pharmacist did not have any questions about prescription. Orders were read back for confirmation. Lorenso Courier, [place]

## 2018-07-30 NOTE — Research (Signed)
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 Both [id number] and [id number] are changing and we reviewed the new versions and requirements for each. A4536 is going to once a year and A5322 may be stopping after the next visit unless it gets extended and if that happens, it will just be for once a year.

## 2018-07-31 LAB — T-HELPER CELL (CD4) - (RCID CLINIC ONLY)
CD4 % Helper T Cell: 27 % — ABNORMAL LOW (ref 33–65)
CD4 T Cell Abs: 726 /uL (ref 400–1790)

## 2018-07-31 LAB — URINE CYTOLOGY ANCILLARY ONLY
Chlamydia: NEGATIVE
Neisseria Gonorrhea: NEGATIVE

## 2018-08-05 LAB — CBC WITH DIFFERENTIAL/PLATELET
Absolute Monocytes: 608 cells/uL (ref 200–950)
Basophils Absolute: 88 cells/uL (ref 0–200)
Basophils Relative: 1.1 %
Eosinophils Absolute: 312 cells/uL (ref 15–500)
Eosinophils Relative: 3.9 %
HCT: 41.4 % (ref 38.5–50.0)
Hemoglobin: 14.8 g/dL (ref 13.2–17.1)
Lymphs Abs: 2840 cells/uL (ref 850–3900)
MCH: 33.4 pg — ABNORMAL HIGH (ref 27.0–33.0)
MCHC: 35.7 g/dL (ref 32.0–36.0)
MCV: 93.5 fL (ref 80.0–100.0)
MPV: 10.3 fL (ref 7.5–12.5)
Monocytes Relative: 7.6 %
Neutro Abs: 4152 cells/uL (ref 1500–7800)
Neutrophils Relative %: 51.9 %
Platelets: 258 10*3/uL (ref 140–400)
RBC: 4.43 10*6/uL (ref 4.20–5.80)
RDW: 12.6 % (ref 11.0–15.0)
Total Lymphocyte: 35.5 %
WBC: 8 10*3/uL (ref 3.8–10.8)

## 2018-08-05 LAB — COMPLETE METABOLIC PANEL WITH GFR
AG Ratio: 1.5 (calc) (ref 1.0–2.5)
ALT: 8 U/L — ABNORMAL LOW (ref 9–46)
AST: 14 U/L (ref 10–35)
Albumin: 4.5 g/dL (ref 3.6–5.1)
Alkaline phosphatase (APISO): 94 U/L (ref 35–144)
BUN: 23 mg/dL (ref 7–25)
CO2: 27 mmol/L (ref 20–32)
Calcium: 9.9 mg/dL (ref 8.6–10.3)
Chloride: 100 mmol/L (ref 98–110)
Creat: 1.25 mg/dL (ref 0.70–1.25)
GFR, Est African American: 72 mL/min/{1.73_m2} (ref >=60)
GFR, Est Non African American: 62 mL/min/{1.73_m2} (ref >=60)
Globulin: 3.1 g/dL (calc) (ref 1.9–3.7)
Glucose, Bld: 104 mg/dL — ABNORMAL HIGH (ref 65–99)
Potassium: 4.2 mmol/L (ref 3.5–5.3)
Sodium: 137 mmol/L (ref 135–146)
Total Bilirubin: 0.4 mg/dL (ref 0.2–1.2)
Total Protein: 7.6 g/dL (ref 6.1–8.1)

## 2018-08-05 LAB — RPR TITER: RPR Titer: 1:8 {titer} — ABNORMAL HIGH

## 2018-08-05 LAB — HIV-1 RNA QUANT-NO REFLEX-BLD
HIV 1 RNA Quant: 24 copies/mL — ABNORMAL HIGH
HIV-1 RNA Quant, Log: 1.38 Log copies/mL — ABNORMAL HIGH

## 2018-08-05 LAB — FLUORESCENT TREPONEMAL AB(FTA)-IGG-BLD: Fluorescent Treponemal ABS: REACTIVE — AB

## 2018-08-05 LAB — RPR: RPR Ser Ql: REACTIVE — AB

## 2018-08-08 ENCOUNTER — Other Ambulatory Visit: Payer: Self-pay

## 2018-08-08 NOTE — Patient Outreach (Signed)
[name] Care Management  08/08/2018  Jonathan Estes 1956/01/05 [id number]   [name] [place] Identifiers Verify spoke with patient he is past due on Pravastatin 20 mg he explain he is taking 1 tablet daily and has plenty at this time patient did said on Metformin he is only taking 1/2 tablet not 1 tablet daily. Jonathan Estes is showing past due under [employer].   Jonathan Estes CPhT Pharmacy Technician [name] [email or web address]

## 2018-08-12 ENCOUNTER — Encounter (INDEPENDENT_AMBULATORY_CARE_PROVIDER_SITE_OTHER): Payer: Medicare Other | Admitting: Ophthalmology

## 2018-08-13 ENCOUNTER — Other Ambulatory Visit: Payer: Self-pay

## 2018-08-13 ENCOUNTER — Other Ambulatory Visit: Payer: Self-pay | Admitting: Family Medicine

## 2018-08-13 ENCOUNTER — Encounter: Payer: Self-pay | Admitting: Infectious Disease

## 2018-08-13 ENCOUNTER — Ambulatory Visit (INDEPENDENT_AMBULATORY_CARE_PROVIDER_SITE_OTHER): Payer: Medicare Other | Admitting: Infectious Disease

## 2018-08-13 VITALS — BP 120/65 | HR 56 | Temp 97.4°F

## 2018-08-13 DIAGNOSIS — E119 Type 2 diabetes mellitus without complications: Secondary | ICD-10-CM | POA: Diagnosis not present

## 2018-08-13 DIAGNOSIS — I2510 Atherosclerotic heart disease of native coronary artery without angina pectoris: Secondary | ICD-10-CM | POA: Diagnosis not present

## 2018-08-13 DIAGNOSIS — I10 Essential (primary) hypertension: Secondary | ICD-10-CM

## 2018-08-13 DIAGNOSIS — B20 Human immunodeficiency virus [HIV] disease: Secondary | ICD-10-CM

## 2018-08-13 DIAGNOSIS — A5271 Late syphilitic oculopathy: Secondary | ICD-10-CM

## 2018-08-13 MED ORDER — BICTEGRAVIR-EMTRICITAB-TENOFOV 50-200-25 MG PO TABS: 1.0000 | ORAL_TABLET | Freq: Every day | ORAL | 11 refills | Status: DC

## 2018-08-13 NOTE — Progress Notes (Signed)
Virtual Visit via Telephone Note  I connected with Jonathan Estes on 08/13/18 at  9:00 AM EDT by telephone and verified that I am speaking with the correct person using two identifiers.  Location: Patient: Home  provider: [facility]   I discussed the limitations, risks, security and privacy concerns of performing an evaluation and management service by telephone and the availability of in person appointments. I also discussed with the patient that there may be a patient responsible charge related to this service. The patient expressed understanding and agreed to proceed.   History of Present Illness:  Jonathan Estes is a 63 year old Caucasian man living with HIV who has comorbid diabetes mellitus hypertension and coronary artery disease.  He has maintained excellent virological suppression on Biktarvy.  Is trying to shelter in place as best he can to avoid contracting novel coronavirus 2019.  He was relating to me that his mother was upset that he was not visiting her though she does have a caregiver living with her namely his brother.     Observations/Objective:  HIV disease well-controlled  Diabetes mellitus  Coronary artery disease  Triglycerides  Syphilis  Assessment and Plan:  HIV disease well-controlled continue Biktarvy we will have him come back in 1 years time for labs and visit  Syphilis: Titer has gone down he did have ocular involvement before he does need follow-up with ophthalmology.  For diabetes coronary disease triglycerides to PCP   Follow Up Instructions:    I discussed the assessment and treatment plan with the patient. The patient was provided an opportunity to ask questions and all were answered. The patient agreed with the plan and demonstrated an understanding of the instructions.   The patient was advised to call back or seek an in-person evaluation if the symptoms worsen or if the condition fails to improve as anticipated.  I provided 21 minutes of  non-face-to-face time during this encounter.   [name], MD

## 2018-08-18 ENCOUNTER — Other Ambulatory Visit: Payer: Self-pay | Admitting: Family Medicine

## 2018-08-18 DIAGNOSIS — E1169 Type 2 diabetes mellitus with other specified complication: Secondary | ICD-10-CM

## 2018-08-27 ENCOUNTER — Encounter (INDEPENDENT_AMBULATORY_CARE_PROVIDER_SITE_OTHER): Payer: Medicare Other | Admitting: Ophthalmology

## 2018-09-09 ENCOUNTER — Other Ambulatory Visit: Payer: Self-pay

## 2018-09-09 NOTE — Patient Outreach (Signed)
[facility]) Care Management  09/09/2018  Jonathan Estes [birth year] [id number]   Medication Adherence call to [name] [place] Identifiers Verify spoke with patient he is past due on Lisinopril 40 mg patient explain he is taking 1 tablet daily and has plenty he explain [facility] the [facility] and patient end up with more.Jonathan Estes is showing past due under [place].   [facility] Management Direct Dial [phone]  Fax [phone] [email or web address]

## 2018-09-13 ENCOUNTER — Other Ambulatory Visit: Payer: Self-pay | Admitting: Family Medicine

## 2018-09-13 DIAGNOSIS — E1121 Type 2 diabetes mellitus with diabetic nephropathy: Secondary | ICD-10-CM

## 2018-09-13 DIAGNOSIS — E119 Type 2 diabetes mellitus without complications: Secondary | ICD-10-CM

## 2018-09-15 ENCOUNTER — Other Ambulatory Visit: Payer: Self-pay | Admitting: Family Medicine

## 2018-09-15 DIAGNOSIS — I10 Essential (primary) hypertension: Secondary | ICD-10-CM

## 2018-09-24 ENCOUNTER — Telehealth: Payer: Self-pay | Admitting: Family Medicine

## 2018-09-24 NOTE — Telephone Encounter (Signed)
Called and the number is not a working number. MPulliam, CMA/RT(R)

## 2018-09-24 NOTE — Telephone Encounter (Signed)
Called number - not working.  Per chart patient is on a statin. MPulliam, CMA/RT(R)

## 2018-09-24 NOTE — Telephone Encounter (Signed)
Bridgett @ [employer] called states since pt has Cardio vascular disease he needs to be on statins & request provider call to discuss.  --Forwarding message to medical assistant to call Bridgett @ [phone].  --glh

## 2018-10-09 ENCOUNTER — Other Ambulatory Visit: Payer: Self-pay | Admitting: Family Medicine

## 2018-10-09 DIAGNOSIS — E1169 Type 2 diabetes mellitus with other specified complication: Secondary | ICD-10-CM

## 2018-10-09 DIAGNOSIS — E781 Pure hyperglyceridemia: Secondary | ICD-10-CM

## 2018-10-09 DIAGNOSIS — I2510 Atherosclerotic heart disease of native coronary artery without angina pectoris: Secondary | ICD-10-CM

## 2018-10-09 DIAGNOSIS — E782 Mixed hyperlipidemia: Secondary | ICD-10-CM

## 2018-10-22 ENCOUNTER — Other Ambulatory Visit: Payer: Self-pay

## 2018-10-22 ENCOUNTER — Encounter: Payer: Self-pay | Admitting: *Deleted

## 2018-10-22 NOTE — Patient Outreach (Signed)
[facility]) Care Management  10/22/2018  Jonathan Estes 16-Aug-1955 [id number]   [name] Hipp[place] Identifiers Verify spoke with patient he is past due on Metformin 500 mg patient explain he is only taking 1 tablet daily and cuts in it 1/2 and takes it  in the morning and at night patient has plenty for the yearbut doctor change it to 1/2 tab in the morning and 1/2 tablet at night patient prescription said to take 1 tablet 2 times a day. [name] is showing past due under [name].  [place] Management Direct Dial [phone]  Fax [phone] [email or web address]

## 2018-10-24 ENCOUNTER — Other Ambulatory Visit: Payer: Self-pay | Admitting: Infectious Disease

## 2018-10-24 DIAGNOSIS — F419 Anxiety disorder, unspecified: Secondary | ICD-10-CM

## 2018-10-27 ENCOUNTER — Other Ambulatory Visit: Payer: Self-pay | Admitting: Infectious Disease

## 2018-10-27 ENCOUNTER — Telehealth: Payer: Self-pay | Admitting: *Deleted

## 2018-10-27 DIAGNOSIS — F419 Anxiety disorder, unspecified: Secondary | ICD-10-CM

## 2018-10-27 MED ORDER — ALPRAZOLAM 0.5 MG PO TABS: 0.5000 mg | ORAL_TABLET | Freq: Three times a day (TID) | ORAL | 1 refills | Status: DC

## 2018-10-27 NOTE — Telephone Encounter (Signed)
Patient has requested refill of xanax from Dr Tommy Medal. Please advise Landis Gandy, RN

## 2018-10-27 NOTE — Telephone Encounter (Signed)
It didn't go through. [facility], let patient know.

## 2018-10-27 NOTE — Addendum Note (Signed)
Addended by: Landis Gandy on: 10/27/2018 03:56 PM   Modules accepted: Orders

## 2018-10-27 NOTE — Telephone Encounter (Signed)
I'm sorry maybe I did it wrong I signed it

## 2018-10-27 NOTE — Telephone Encounter (Signed)
I think I just renewed it. I turned on my imprivata program and sent script in electronically

## 2018-10-28 ENCOUNTER — Other Ambulatory Visit: Payer: Self-pay | Admitting: Infectious Disease

## 2018-10-28 DIAGNOSIS — F419 Anxiety disorder, unspecified: Secondary | ICD-10-CM

## 2018-11-13 ENCOUNTER — Telehealth: Payer: Self-pay

## 2018-11-13 ENCOUNTER — Other Ambulatory Visit: Payer: Self-pay | Admitting: Family Medicine

## 2018-11-13 DIAGNOSIS — I10 Essential (primary) hypertension: Secondary | ICD-10-CM

## 2018-11-13 NOTE — Telephone Encounter (Signed)
Please call pt to schedule f/u in office.  No further refills until pt is seen.  T. Marcques Wrightsman, CMA 

## 2018-11-14 ENCOUNTER — Other Ambulatory Visit: Payer: Self-pay | Admitting: Family Medicine

## 2018-11-14 DIAGNOSIS — I10 Essential (primary) hypertension: Secondary | ICD-10-CM

## 2018-11-14 DIAGNOSIS — E038 Other specified hypothyroidism: Secondary | ICD-10-CM

## 2018-12-08 ENCOUNTER — Other Ambulatory Visit: Payer: Self-pay | Admitting: Family Medicine

## 2018-12-08 DIAGNOSIS — I10 Essential (primary) hypertension: Secondary | ICD-10-CM

## 2018-12-10 ENCOUNTER — Ambulatory Visit (INDEPENDENT_AMBULATORY_CARE_PROVIDER_SITE_OTHER): Payer: Medicare Other | Admitting: Family Medicine

## 2018-12-10 ENCOUNTER — Encounter: Payer: Self-pay | Admitting: Family Medicine

## 2018-12-10 ENCOUNTER — Other Ambulatory Visit: Payer: Self-pay

## 2018-12-10 VITALS — BP 118/65 | HR 66 | Ht 69.0 in | Wt 195.0 lb

## 2018-12-10 DIAGNOSIS — E119 Type 2 diabetes mellitus without complications: Secondary | ICD-10-CM | POA: Diagnosis not present

## 2018-12-10 DIAGNOSIS — E1159 Type 2 diabetes mellitus with other circulatory complications: Secondary | ICD-10-CM | POA: Diagnosis not present

## 2018-12-10 DIAGNOSIS — E1169 Type 2 diabetes mellitus with other specified complication: Secondary | ICD-10-CM | POA: Diagnosis not present

## 2018-12-10 DIAGNOSIS — E1129 Type 2 diabetes mellitus with other diabetic kidney complication: Secondary | ICD-10-CM | POA: Diagnosis not present

## 2018-12-10 DIAGNOSIS — E1121 Type 2 diabetes mellitus with diabetic nephropathy: Secondary | ICD-10-CM

## 2018-12-10 DIAGNOSIS — F419 Anxiety disorder, unspecified: Secondary | ICD-10-CM

## 2018-12-10 DIAGNOSIS — I10 Essential (primary) hypertension: Secondary | ICD-10-CM

## 2018-12-10 DIAGNOSIS — R809 Proteinuria, unspecified: Secondary | ICD-10-CM

## 2018-12-10 DIAGNOSIS — E782 Mixed hyperlipidemia: Secondary | ICD-10-CM

## 2018-12-10 MED ORDER — METFORMIN HCL 500 MG PO TABS: 250.0000 mg | ORAL_TABLET | Freq: Two times a day (BID) | ORAL | 0 refills | Status: DC

## 2018-12-10 MED ORDER — HYDROCHLOROTHIAZIDE 12.5 MG PO CAPS: 12.5000 mg | ORAL_CAPSULE | Freq: Every day | ORAL | 0 refills | Status: DC

## 2018-12-10 NOTE — Progress Notes (Signed)
Telehealth office visit note for Jonathan Estes, D.O- at Primary Care at [facility]   I connected with current patient today and verified that I am speaking with the correct person using two identifiers.   . Location of the patient: Home . Location of the provider: Office Only the patient (+/- their family members at pt's discretion) and myself were participating in the encounter - This visit type was conducted due to national recommendations for restrictions regarding the COVID-19 Pandemic (e.g. social distancing) in an effort to limit this patient's exposure and mitigate transmission in our community.  This format is felt to be most appropriate for this patient at this time.   - The patient did not have access to video technology or had technical difficulties with video requiring transitioning to audio format only. - No physical exam could be performed with this format, beyond that communicated to [place] by the patient/ family members as noted.   - Additionally my office staff/ schedulers discussed with the patient that there may be a monetary charge related to this service, depending on their medical insurance.   The patient expressed understanding, and agreed to proceed.       History of Present Illness:  Notes doing "alright" today.  Denies concerns; "not that I'm aware of." Says "I haven't been to nobody else besides you and Dr. Tommy Medal."  States "I've been doing okay."  Confirms trying to stay safe.  Notes "went to my mother's this weekend; hadn't been there in six months."  Says they did not engage in social distancing; "we just acted like normal people."  Says she's to the point she's 84 and saying "I might not be here next year anyway."  Notes he went and got tested for COVID recently (before visiting his mother) and it came back negative.  - Mood Denies depression.  Says "the anxiety is there a little bit every once in a while still." Says "everything is fine on that;  I've been on the same amount of xanax for the last 7-8 years." Notes he takes xanax nightly before bed.  Per patient, "if I take the two xanax at night, it keeps my anxiety down during the day unless someone pisses me off."  Denies panic attacks.  Says "I call it more a 'pissed off attack.'"  - Diabetes Mellitus Says "everything is fine as far as I know."  Notes hasn't been checking his sugar because "I haven't had no signs of excess anything or nothing like that."  Notes when he does check his blood sugar, it's controlled; states "I haven't seen one over 120 in a long time."  Continues his metformin twice a day; notes "cutting it in half and taking" half in the morning and half in the evening.  Says everything he's taking was originally written by Dr. Tommy Medal.  Notes he took a week off from sweets this week, and next week might eat sweets again.  Says "I try not to go down that [facility] of the [facility]."  Denies increased thirst or urination; denies cramps.  Says "the only time I piss a lot is when I drink a lot of water."  Denies new numbness or tingling in the extremities.  - Hypertension associated with DM Notes BP was "116 over something, pulse of 66."  Says "even the average was about almost identical to that."  Confirms feeling good, denies dizziness, headaches, visual changes, chest pain, SOB, or any other cardiac  symptoms.  States "not that I can tell."  - Hyperlipidemia associated with DM Continues gemfibrozil daily. Continues Pravachol nightly.    GAD 7 : Generalized Anxiety Score 07/17/2018 07/23/2016  Nervous, Anxious, on Edge 0 1  Control/stop worrying 0 1  Worry too much - different things 0 1  Trouble relaxing 0 1  Restless 0 1  Easily annoyed or irritable 0 1  Afraid - awful might happen 0 0  Total GAD 7 Score 0 6  Anxiety Difficulty Not difficult at all Somewhat difficult    Depression screen [facility] 2/9 07/17/2018 01/21/2018 12/16/2017 08/13/2017 08/12/2017  Decreased  Interest 0 0 0 0 0  Down, Depressed, Hopeless 0 0 0 0 0  PHQ - 2 Score 0 0 0 0 0  Altered sleeping 0 0 0 0 -  Tired, decreased energy 0 0 0 0 -  Change in appetite 0 0 1 0 -  Feeling bad or failure about yourself  0 0 0 0 -  Trouble concentrating 0 0 0 0 -  Moving slowly or fidgety/restless 0 0 0 0 -  Suicidal thoughts 0 0 0 0 -  PHQ-9 Score 0 0 1 0 -  Difficult doing work/chores Not difficult at all Not difficult at all Not difficult at all Not difficult at all -  Some recent data might be hidden      Impression and Recommendations:    1. Controlled type 2 diabetes mellitus without complication, without long-term current use of insulin (HCC)   2. Hypertension associated with diabetes (HCC)   3. Mixed diabetic hyperlipidemia associated with type 2 diabetes mellitus (HCC)   4. Microalbuminuria due to type 2 diabetes mellitus (HCC)   5. Dyslipidemia with low high density lipoprotein (HDL) cholesterol with hypertriglyceridemia due to type 2 diabetes mellitus (HCC)   6. Diabetic nephropathy associated with type 2 diabetes mellitus: Microalbuminuria   7. Anxiety      - Last labs drawn 12/16/2017; due for lab repeat.  Controlled Type 2 Diabetes Mellitus - Last A1c drawn 07/14/2018 = 6.1, good control. - Pt will continue current treatment regimen.  See med list.  - Counseled patient on pathophysiology of disease and discussed various treatment options, which always includes dietary and lifestyle modification as first line.    - Importance of low carb, heart-healthy diet discussed with patient in addition to regular aerobic exercise of 5d/week or more.   - STRONGLY encouraged patient to check FBS and 2 hours after the biggest meal of your day.  Keep log and bring in next OV for my review.     - Also told patient if you ever feel poorly, please check your blood pressure and blood sugar, as one or the other could be the cause of your symptoms.  - Pt reminded about need for yearly  eye and foot exams.  Told patient to make appt.for diabetic eye exam, CMAs here will do foot exams  - Handouts provided at patient's desire and or told to go online at the American Diabetes Association website for further information  - We will continue to monitor and re-check A1c as recommended.  Diabetic Nephropathy associated with DM, Microalbuminuria - Pt overdue for urine micro albumin.  See orders today. - Will continue to monitor.  Hypertension associated with DM - Blood pressure currently is controlled, at goal. - Patient will continue current treatment regimen.  See med list.  - Counseled patient on pathophysiology of disease and discussed various treatment options, which  always includes dietary and lifestyle modification as first line.   - Lifestyle changes such as dash and heart healthy diets and engaging in a regular exercise program discussed extensively with patient.   - Ambulatory blood pressure monitoring encouraged at least 3 times weekly.  Keep log and bring in every office visit.  Reminded patient that if they ever feel poorly in any way, to check their blood pressure and pulse.  - Handouts provided at patient's desire and/or told to go online at the American Heart Association website for further information  - We will continue to monitor  Mixed Diabetic Hyperlipidemia, Dyslipidemia with low HDL, Hypertriglyceridemia - Managed on Pravachol and gemfibrozil. - Pt will continue current treatment regimen.  See med list.  - Dietary changes such as low saturated & trans fat diets for hyperlipidemia and low carb diets for hypertriglyceridemia discussed with patient.    - Encouraged patient to follow AHA guidelines for regular exercise and also engage in weight loss if BMI above 25.   - Educational handouts provided at patient's desire and/ or told to look online at the American Heart Association website for further information.  - We will continue to monitor  Insomnia  & Anxiety Management - Managed Historically on Xanax - Advised patient that chronic use of xanax may lead to health risks, including memory problems. - Lengthy discussion held with patient regarding prudent use of xanax. - Advised use of trazodone for sleep instead of xanax.  - Patient states he would rather wait until next month to talk about medication changes. - Declines changes in treatment plan today.  - Will continue to monitor.  Health Counseling & Preventative Health Maintenance - Advised patient to continue working toward exercising to improve overall mental, physical, and emotional health.    - Reviewed the "spokes of the wheel" of mood and health management.  Stressed the importance of ongoing prudent habits, including regular exercise, appropriate sleep hygiene, healthful dietary habits, and prayer/meditation to relax.  - Encouraged patient to engage in daily physical activity, especially a formal exercise routine.  Recommended that the patient eventually strive for at least 150 minutes of moderate cardiovascular activity per week according to guidelines established by the [facility].   - Healthy dietary habits encouraged, including low-carb, and high amounts of lean protein in diet.   - Patient should also consume adequate amounts of water.  - Health counseling performed.  All questions answered.  COVID Counseling - Novel Covid -19 counseling done; all questions were answered.   - Current CDC / federal and  guidelines reviewed with patient  - Reminded pt of extreme importance of social distancing; wearing a mask when out in public; insensate handwashing and cleaning of surfaces, avoiding unnecessary trips for shopping and avoiding ALL but emergency appts etc. - Told patient to be prepared, not scared; and be smart for the sake of others - Patient will call with any additional concerns  Recommendations - Last physical done 01/22/2019. - Return around 01/23/2019 for CPE  and full FBW. - Continue management with specialists as established.   - As part of my medical decision making, I reviewed the following data within the electronic MEDICAL RECORD NUMBER History obtained from pt /family, CMA notes reviewed and incorporated if applicable, Labs reviewed, Radiograph/ tests reviewed if applicable and OV notes from prior OV's with me, as well as other specialists she/he has seen since seeing me last, were all reviewed and used in my medical decision making process today.    -  Additionally, discussion had with patient regarding our treatment plan, and their biases/concerns about that plan were used in my medical decision making today.    - The patient agreed with the plan and demonstrated an understanding of the instructions.   No barriers to understanding were identified.    - Red flag symptoms and signs discussed in detail.  Patient expressed understanding regarding what to do in case of emergency\ urgent symptoms.   - The patient was advised to call back or seek an in-person evaluation if the symptoms worsen or if the condition fails to improve as anticipated.   Return for CPE and full FBW around 01/23/2019.    Orders Placed This Encounter  Procedures  . Microalbumin / creatinine urine ratio    Meds ordered this encounter  Medications  . hydrochlorothiazide (MICROZIDE) 12.5 MG capsule    Sig: Take 1 capsule (12.5 mg total) by mouth daily.    Dispense:  90 capsule    Refill:  0  . metFORMIN (GLUCOPHAGE) 500 MG tablet    Sig: Take 0.5 tablets (250 mg total) by mouth 2 (two) times daily with a meal.    Dispense:  90 tablet    Refill:  0    Medications Discontinued During This Encounter  Medication Reason  . metFORMIN (GLUCOPHAGE) 500 MG tablet Change in therapy  . METFORMIN HCL PO Change in therapy  . hydrochlorothiazide (MICROZIDE) 12.5 MG capsule Reorder  . metFORMIN (GLUCOPHAGE) 500 MG tablet Reorder      I provided 22 minutes of non face-to-face  time during this encounter.  Additional time was spent with charting and coordination of care after the actual visit commenced.   Note:  This note was prepared with assistance of Dragon voice recognition software. Occasional wrong-word or sound-a-like substitutions may have occurred due to the inherent limitations of voice recognition software.   This document serves as a record of services personally performed by [name], DO. It was created on her behalf by Peggye Fothergill, a trained medical scribe. The creation of this record is based on the scribe's personal observations and the provider's statements to them.   I have reviewed the above medical documentation for accuracy and completeness and I concur.  [name], DO 12/13/2018 3:01 PM      Patient Care Team    Relationship Specialty Notifications Start End  [name], DO PCP - General Family Medicine  11/26/16   Louis Meckel, MD (Inactive) Consulting Physician Gastroenterology  07/23/16   Daiva Eves, Lisette Grinder, MD Consulting Physician Infectious Diseases  12/31/16    Comment: HIV doc  Darden Amber, OD Referring Physician Optometry  12/31/16      -Vitals obtained; medications/ allergies reconciled;  personal medical, social, Sx etc.histories were updated by CMA, reviewed by me and are reflected in chart   Patient Active Problem List   Diagnosis Date Noted  . Microalbuminuria due to type 2 diabetes mellitus (HCC) 12/31/2016    Priority: High  . Dyslipidemia with low high density lipoprotein (HDL) cholesterol with hypertriglyceridemia due to type 2 diabetes mellitus (HCC) 07/23/2016    Priority: High  . Diabetic nephropathy associated with type 2 diabetes mellitus: Microalbuminuria 07/23/2016    Priority: High  . Mixed diabetic hyperlipidemia associated with type 2 diabetes mellitus (HCC) 07/11/2010    Priority: High  . Controlled type 2 diabetes mellitus without complication, without long-term  current use of insulin (HCC) 02/07/2009    Priority: High  . Hypertension associated with diabetes (  HCC) 04/12/2008    Priority: High  . Family history of colon cancer in father- in 21's 11/26/2016    Priority: Medium  . CAD (coronary artery disease) 07/16/2011    Priority: Medium  . Hypothyroidism 04/24/2008    Priority: Medium  . Human immunodeficiency virus (HIV) disease (HCC) 10/14/2007    Priority: Medium  . Uveitis 03/21/2017    Priority: Low  . Vitamin D deficiency 12/31/2016    Priority: Low  . Environmental and seasonal allergies 07/30/2016    Priority: Low  . Anxiety 01/29/2011    Priority: Low  . DEPRESSION/ANXIETY 06/27/2009    Priority: Low  . h/o COCAINE ABUSE, EPISODIC 10/14/2007    Priority: Low  . CANNABIS ABUSE, HX OF 10/14/2007    Priority: Low  . AKI (acute kidney injury) (HCC) 08/12/2017  . 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 Change in vision 03/21/2017  . Low level of high density lipoprotein (HDL) 12/30/2016  . Hypertriglyceridemia 12/30/2016  . External hemorrhoids without complication 11/26/2016  . Dyspnea 05/10/2011  . Abnormal chest CT 04/12/2011  . PULMONARY NODULE 02/21/2010  . ORBITAL FLOOR , CLOSED FRACTURE 02/21/2010  . UNSPECIFIED VISUAL LOSS 12/26/2009  . TRANSIENT GLOBAL AMNESIA 12/26/2009  . SHOULDER STRAIN 06/13/2009  . FATIGUE 04/12/2008  . METHICILLIN SUSCEPTIBLE STAPH INF CCE & UNS SITE 10/14/2007  . CANDIDIASIS OF MOUTH 10/14/2007  . PNEUMOCYSTIS PNEUMONIA 10/14/2007  . CUTANEOUS ERUPTIONS, DRUG-INDUCED 10/14/2007     Current Meds  Medication Sig  . ALPRAZolam (XANAX) 0.5 MG tablet Take 1 tablet (0.5 mg total) by mouth 3 (three) times daily.  . bictegravir-emtricitabine-tenofovir AF (BIKTARVY) 50-200-25 MG TABS tablet Take 1 tablet by mouth daily.  . cetirizine (ZYRTEC) 10 MG tablet TAKE 1 TABLET (10 MG TOTAL) BY MOUTH DAILY.  . [facility] LANCETS ORIGINAL MISC 1 application by Does not apply route daily.  Marland Kitchen gemfibrozil (LOPID) 600 MG  tablet TAKE 1 TABLET BY MOUTH TWICE A DAY  . glucose blood (ACCU-CHEK AVIVA) test strip Use as instructed  . hydrochlorothiazide (MICROZIDE) 12.5 MG capsule Take 1 capsule (12.5 mg total) by mouth daily.  Marland Kitchen levothyroxine (SYNTHROID) 25 MCG tablet TAKE 1 TABLET BY MOUTH EVERY DAY ON AN EMPTY STOMACH  . lisinopril (ZESTRIL) 40 MG tablet TAKE 1 TABLET BY MOUTH EVERY DAY  . metFORMIN (GLUCOPHAGE) 500 MG tablet Take 0.5 tablets (250 mg total) by mouth 2 (two) times daily with a meal.  . Misc Natural Products (OSTEO BI-FLEX JOINT SHIELD PO) Take 1 tablet by mouth daily.  . Multiple Vitamins-Minerals (ICAPS AREDS 2 PO) Take by mouth.  . pravastatin (PRAVACHOL) 20 MG tablet TAKE 1 TABLET BY MOUTH EVERY DAY AT BEDTIME  . Vitamin D, Ergocalciferol, (DRISDOL) 1.25 MG (50000 UT) CAPS capsule Take one tablet wkly  . zinc gluconate 50 MG tablet Take 50 mg by mouth daily.  . [DISCONTINUED] hydrochlorothiazide (MICROZIDE) 12.5 MG capsule Take 1 capsule (12.5 mg total) by mouth daily. OFFICE VISIT REQUIRED PRIOR TO ANY FURTHER REFILLS  . [DISCONTINUED] metFORMIN (GLUCOPHAGE) 500 MG tablet TAKE 1 TABLET BY MOUTH TWICE A DAY WITH MEALS (Patient taking differently: Take 250 mg by mouth 2 (two) times daily. Give w/food.)  . [DISCONTINUED] metFORMIN (GLUCOPHAGE) 500 MG tablet Take 250 mg by mouth 2 (two) times daily with a meal.  . [DISCONTINUED] METFORMIN HCL PO Take 250 mg by mouth 2 (two) times daily.      Allergies:  Allergies  Allergen Reactions  . Sulfamethoxazole-Trimethoprim     REACTION: severe rash  ROS:  See above HPI for pertinent positives and negatives   Objective:   Blood pressure 118/65, pulse 66, height 5\' 9"  (1.753 m), weight 195 lb (88.5 kg).  (if some vitals are omitted, this means that patient was UNABLE to obtain them even though they were asked to get them prior to OV today.  They were asked to call at their earliest convenience with these once obtained. )  General: A & O *  3; sounds in no acute distress; in usual state of health.  Skin: Pt confirms warm and dry extremities and pink fingertips HEENT: Pt confirms lips non-cyanotic Chest: Patient confirms normal chest excursion and movement Respiratory: speaking in full sentences, no conversational dyspnea; patient confirms no use of accessory muscles Psych: insight appears good, mood- appears full

## 2018-12-25 ENCOUNTER — Other Ambulatory Visit: Payer: Self-pay

## 2018-12-25 NOTE — Patient Outreach (Signed)
[place] [facility]) Care Management  12/25/2018  DUONG HAYDEL [birth year] [id number]   Medication Adherence call to Mr. Marquon Alcala spoke with patient he is past due on Lisinopril 40 mg patient explain he takes 1 tablet daily,patient has enough for 15 more days,patient explain at some point it was fill early.patient did not want to provide with his birthday, but provide with all his medications information. Mr. Hennon is showing past due under [place].   [place] Management Direct Dial [phone]  Fax [phone] [email or web address]

## 2019-01-07 ENCOUNTER — Other Ambulatory Visit: Payer: Self-pay

## 2019-01-07 NOTE — Patient Outreach (Signed)
[place] [facility]) Care Management  01/07/2019  Jonathan Estes 1955/05/20 [id number]   Medication Adherence call to [name]  [facility] with patient he is past due on Lisinopril 40 mg patient explain he takes 1 tablet daily and has [facility] and will pick up tomorrow.Mr. Yo is showing past due under [place].   [place] Management Direct Dial [phone]  Fax [phone] [email or web address]

## 2019-01-13 ENCOUNTER — Other Ambulatory Visit: Payer: Self-pay | Admitting: Family Medicine

## 2019-01-13 DIAGNOSIS — E781 Pure hyperglyceridemia: Secondary | ICD-10-CM

## 2019-01-13 DIAGNOSIS — E1169 Type 2 diabetes mellitus with other specified complication: Secondary | ICD-10-CM

## 2019-01-13 DIAGNOSIS — E782 Mixed hyperlipidemia: Secondary | ICD-10-CM

## 2019-01-13 DIAGNOSIS — I2510 Atherosclerotic heart disease of native coronary artery without angina pectoris: Secondary | ICD-10-CM

## 2019-01-14 ENCOUNTER — Other Ambulatory Visit: Payer: Self-pay | Admitting: Infectious Disease

## 2019-01-14 ENCOUNTER — Other Ambulatory Visit: Payer: Self-pay | Admitting: Family Medicine

## 2019-01-14 DIAGNOSIS — E119 Type 2 diabetes mellitus without complications: Secondary | ICD-10-CM

## 2019-01-14 DIAGNOSIS — F419 Anxiety disorder, unspecified: Secondary | ICD-10-CM

## 2019-01-14 DIAGNOSIS — E1159 Type 2 diabetes mellitus with other circulatory complications: Secondary | ICD-10-CM

## 2019-01-14 DIAGNOSIS — I152 Hypertension secondary to endocrine disorders: Secondary | ICD-10-CM

## 2019-01-14 DIAGNOSIS — E782 Mixed hyperlipidemia: Secondary | ICD-10-CM

## 2019-01-14 DIAGNOSIS — E1169 Type 2 diabetes mellitus with other specified complication: Secondary | ICD-10-CM

## 2019-01-14 NOTE — Telephone Encounter (Signed)
REcieved refill request for patient's Xanax. Will forward message to Md to advise if refill is okay to send. [place]

## 2019-01-15 ENCOUNTER — Telehealth: Payer: Self-pay

## 2019-01-15 DIAGNOSIS — F419 Anxiety disorder, unspecified: Secondary | ICD-10-CM

## 2019-01-15 MED ORDER — ALPRAZOLAM 0.5 MG PO TABS: 0.5000 mg | ORAL_TABLET | Freq: Three times a day (TID) | ORAL | 1 refills | Status: DC

## 2019-01-15 NOTE — Addendum Note (Signed)
Addended by: Mauricio Po D on: 01/15/2019 04:31 PM   Modules accepted: Orders

## 2019-01-15 NOTE — Telephone Encounter (Signed)
Ok to refill but ? If [place] could refill it if it must be done next 2 days due to my probems with imprivata app

## 2019-01-15 NOTE — Telephone Encounter (Signed)
Thx Greg !

## 2019-01-15 NOTE — Telephone Encounter (Signed)
Medication refilled and sent to the pharmacy.

## 2019-01-15 NOTE — Telephone Encounter (Signed)
Received refill request for patient's Xanax. Will forward message to MD regarding refill. [place]

## 2019-01-15 NOTE — Telephone Encounter (Signed)
My Impravata is awaiting registration for my new iPhone so I cannot prescribe until they fix that. Sorry!   Marya Amsler can you help Dr. Lucianne Lei Dam's patient please?

## 2019-01-26 ENCOUNTER — Other Ambulatory Visit: Payer: Self-pay

## 2019-01-26 ENCOUNTER — Encounter (INDEPENDENT_AMBULATORY_CARE_PROVIDER_SITE_OTHER): Payer: Medicare Other | Admitting: *Deleted

## 2019-01-26 VITALS — BP 112/74 | HR 66 | Wt 200.0 lb

## 2019-01-26 DIAGNOSIS — Z006 Encounter for examination for normal comparison and control in clinical research program: Secondary | ICD-10-CM

## 2019-01-26 NOTE — Research (Signed)
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 He said he recently [facility] and still needs to get his 2nd shingles vaccine.  We took him off the A5322 today since the window for the visit was about to expire but we should be able to reenroll him once the study gets extended. He is scheduled for MD visit in June and research in September.

## 2019-01-27 LAB — COMPREHENSIVE METABOLIC PANEL
AG Ratio: 1.5 (calc) (ref 1.0–2.5)
ALT: 6 U/L — ABNORMAL LOW (ref 9–46)
AST: 12 U/L (ref 10–35)
Albumin: 4.6 g/dL (ref 3.6–5.1)
Alkaline phosphatase (APISO): 91 U/L (ref 35–144)
BUN: 16 mg/dL (ref 7–25)
CO2: 23 mmol/L (ref 20–32)
Calcium: 9.9 mg/dL (ref 8.6–10.3)
Chloride: 100 mmol/L (ref 98–110)
Creat: 1.18 mg/dL (ref 0.70–1.25)
Globulin: 3.1 g/dL (calc) (ref 1.9–3.7)
Glucose, Bld: 103 mg/dL — ABNORMAL HIGH (ref 65–99)
Potassium: 4.3 mmol/L (ref 3.5–5.3)
Sodium: 138 mmol/L (ref 135–146)
Total Bilirubin: 0.3 mg/dL (ref 0.2–1.2)
Total Protein: 7.7 g/dL (ref 6.1–8.1)

## 2019-01-27 LAB — LIPID PANEL
Cholesterol: 136 mg/dL (ref <200)
HDL: 49 mg/dL (ref >=40)
LDL Cholesterol (Calc): 63 mg/dL (calc)
Non-HDL Cholesterol (Calc): 87 mg/dL (calc) (ref <130)
Total CHOL/HDL Ratio: 2.8 (calc) (ref <5.0)
Triglycerides: 159 mg/dL — ABNORMAL HIGH (ref <150)

## 2019-01-27 LAB — HEPATITIS C ANTIBODY
Hepatitis C Ab: NONREACTIVE
SIGNAL TO CUT-OFF: 0.05 (ref <1.00)

## 2019-01-27 LAB — PROTEIN, URINE, RANDOM: Total Protein, Urine: 10 mg/dL (ref 5–25)

## 2019-01-27 LAB — CREATININE, URINE, RANDOM: Creatinine, Urine: 43 mg/dL (ref 20–320)

## 2019-02-04 DIAGNOSIS — H40013 Open angle with borderline findings, low risk, bilateral: Secondary | ICD-10-CM | POA: Diagnosis not present

## 2019-02-04 DIAGNOSIS — H25013 Cortical age-related cataract, bilateral: Secondary | ICD-10-CM | POA: Diagnosis not present

## 2019-02-04 DIAGNOSIS — H2513 Age-related nuclear cataract, bilateral: Secondary | ICD-10-CM | POA: Diagnosis not present

## 2019-02-04 DIAGNOSIS — H47291 Other optic atrophy, right eye: Secondary | ICD-10-CM | POA: Diagnosis not present

## 2019-02-04 DIAGNOSIS — H0288A Meibomian gland dysfunction right eye, upper and lower eyelids: Secondary | ICD-10-CM | POA: Diagnosis not present

## 2019-02-12 ENCOUNTER — Encounter: Payer: Self-pay | Admitting: *Deleted

## 2019-02-12 LAB — HIV-1 RNA QUANT-NO REFLEX-BLD
CD4 % Helper T Cell: 25.4
CD4 Count: 610
CD8 % Suppressor T Cell: 50
CD8 T Cell Abs: 1200
HIV-1 RNA Viral Load: <40

## 2019-02-17 NOTE — Progress Notes (Signed)
[facility] - Clinic Note  02/18/2019     CHIEF COMPLAINT Patient presents for Retina Follow Up   HISTORY OF PRESENT ILLNESS: Jonathan Estes is a 63 y.o. male who presents to the clinic today for:   HPI    Retina Follow Up    Patient presents with  Other (Chorioretinitis ).  In right eye.  Severity is moderate.  Duration of 12 months.  Since onset it is stable.  I, the attending physician,  performed the HPI with the patient and updated documentation appropriately.          Comments    Patient states vision the same OU. BS was 108 this am. Last a1c was 6.1. BP around 113/70's in past week.        Last edited by Rennis Chris, MD on 02/19/2019 10:43 AM. (History)    pt states he is doing well, pt states he saw Dr. Hanley Seamen 2 weeks ago and he told him "something about glaucoma", he states Hanley Seamen is having him see one of the MD's he works with  Referring physician:  [name], DO [address] [place],  [place] [address]  HISTORICAL INFORMATION:   Selected notes from the MEDICAL RECORD NUMBER Referred by Dr. Eugene Garnet for concern of optic neuritis, wet AMD, macular separation;  LEE- 01.15.19 (S.[name]) [BCVA OD: 20/100 OS: 20/30] Ocular Hx- glaucoma suspect, APD OD PMH- type II DM, elevated cholesterol, HTN, HIV positive    CURRENT MEDICATIONS: No current outpatient medications on file. (Ophthalmic Drugs)   No current facility-administered medications for this visit. (Ophthalmic Drugs)   Current Outpatient Medications (Other)  Medication Sig  . ALPRAZolam (XANAX) 0.5 MG tablet Take 1 tablet (0.5 mg total) by mouth 3 (three) times daily.  . bictegravir-emtricitabine-tenofovir AF (BIKTARVY) 50-200-25 MG TABS tablet Take 1 tablet by mouth daily.  . cetirizine (ZYRTEC) 10 MG tablet TAKE 1 TABLET (10 MG TOTAL) BY MOUTH DAILY.  . [employer] LANCETS ORIGINAL MISC 1 application by Does not apply route daily.  Marland Kitchen gemfibrozil (LOPID) 600 MG  tablet TAKE 1 TABLET BY MOUTH TWICE A DAY  . glucose blood (ACCU-CHEK AVIVA) test strip Use as instructed  . hydrochlorothiazide (MICROZIDE) 12.5 MG capsule Take 1 capsule (12.5 mg total) by mouth daily.  Marland Kitchen levothyroxine (SYNTHROID) 25 MCG tablet TAKE 1 TABLET BY MOUTH EVERY DAY ON AN EMPTY STOMACH  . lisinopril (ZESTRIL) 40 MG tablet TAKE 1 TABLET BY MOUTH EVERY DAY  . metFORMIN (GLUCOPHAGE) 500 MG tablet Take 0.5 tablets (250 mg total) by mouth 2 (two) times daily with a meal.  . Misc Natural Products (OSTEO BI-FLEX JOINT SHIELD PO) Take 1 tablet by mouth daily.  . Multiple Vitamins-Minerals (ICAPS AREDS 2 PO) Take by mouth.  . pravastatin (PRAVACHOL) 20 MG tablet TAKE 1 TABLET BY MOUTH EVERY DAY AT BEDTIME  . Vitamin D, Ergocalciferol, (DRISDOL) 1.25 MG (50000 UT) CAPS capsule Take one tablet wkly  . zinc gluconate 50 MG tablet Take 50 mg by mouth daily.   No current facility-administered medications for this visit. (Other)      REVIEW OF SYSTEMS: ROS    Positive for: Cardiovascular, Eyes, Respiratory, Heme/Lymph   Negative for: Constitutional, Gastrointestinal, Neurological, Skin, Genitourinary, Musculoskeletal, HENT, Endocrine, Psychiatric, Allergic/Imm   Last edited by Annalee Genta D, COT on 02/18/2019  1:11 PM. (History)       ALLERGIES Allergies  Allergen Reactions  . Sulfamethoxazole-Trimethoprim     REACTION: severe rash  PAST MEDICAL HISTORY Past Medical History:  Diagnosis Date  . AIDS (HCC) 07/12/2014  . AKI (acute kidney injury) (HCC) 08/12/2017  . Anxiety   . Arthritis    "hips, shoulders" (03/21/2017)  . Bilateral bunions    left foot worse  . Coronary artery disease involving native coronary artery 01/12/2015  . Diabetes mellitus type 2, controlled, without complications (HCC) 01/12/2015  . Diabetes type 2, controlled (HCC) 07/12/2014  . High triglycerides   . HTN (hypertension)   . Human immunodeficiency virus (HIV) disease (HCC) d'x 09/2007  .  Hyperlipidemia   . Hypothyroid   . 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 BRONCHOSCOPY  09/29/2007   Bronchoalveolar lavage was  obtained from the left lower lobe.  Under fluoroscopy, transbronchial Hattie Perch 07/04/2010  . CONDYLOMA EXCISION/FULGURATION  ~ 1996   anal/notes 07/04/2010    FAMILY HISTORY Family History  Problem Relation Age of Onset  . Colon cancer Father   . Leukemia Maternal Grandfather     SOCIAL HISTORY Social History   Tobacco Use  . Smoking status: Never Smoker  . Smokeless tobacco: Never Used  Substance Use Topics  . Alcohol use: Yes    Comment: 03/21/2017 "none since 01/12/2017"  . Drug use: Yes    Types: Marijuana, Cocaine    Comment: 03/21/2017 "no cocaine for a few years; I smoke marijuana qd"         OPHTHALMIC EXAM:  Base Eye Exam    Visual Acuity (Snellen - Linear)      Right Left   Dist [place] 20/20 20/20       Tonometry (Tonopen, 1:19 PM)      Right Left   Pressure 13 10       Pupils      Dark Light Shape React APD   Right 4 3 Round Brisk None   Left 4 3 Round Brisk None       Visual Fields (Counting fingers)      Left Right    Full Full       Extraocular Movement      Right Left    Full, Ortho Full, Ortho       Neuro/Psych    Oriented x3: Yes   Mood/Affect: Normal       Dilation    Both eyes: 1.0% Mydriacyl, 2.5% Phenylephrine @ 1:19 PM        Slit Lamp and Fundus Exam    Slit Lamp Exam      Right Left   Lids/Lashes UL Telangiectasia, Meibomian gland dysfunction UL Telangiectasia, Meibomian gland dysfunction   Conjunctiva/Sclera White and quiet White and quiet   Cornea mild Arcus, otherwise clear mild Arcus, otherwise clear   Anterior Chamber Deep and quiet deep, no cell   Iris Round and dilated to 6.5 mm Round and dilated to 6.5 mm   Lens 2+ Nuclear sclerosis, 2+ Cortical cataract 2+ Nuclear sclerosis, 2+ Cortical cataract    Vitreous Vitreous syneresis Vitreous syneresis       Fundus Exam      Right Left   Disc pink, sharp pink, sharp   C/D Ratio 0.2 0.2   Macula Good foveal reflex, trace Retinal pigment epithelial mottling, No heme or edema Good foveal reflex, mild Retinal pigment epithelial mottling, No heme or edema   Vessels mild Vascular attenuation mild Vascular attenuation   Periphery Attached; subtle RPE atrophy from macula extending nasal  to disc-- resolved; mild RPE changes/clumping inferior mid-zone Attached, No heme           IMAGING AND PROCEDURES  Imaging and Procedures for 05/14/17  OCT, Retina - OU - Both Eyes       Right Eye Quality was good. Central Foveal Thickness: 256. Progression has been stable. Findings include normal foveal contour, no IRF, no SRF (Full reconstitution of ellipsoid - stable).   Left Eye Quality was good. Central Foveal Thickness: 257. Progression has been stable. Findings include normal foveal contour, no IRF, no SRF.   Notes *Images captured and stored on drive  Diagnosis / Impression:  OD: NFP, no IRF/SRF; stable, full reconstitution of ellipsoid OS: NFP; no IRF/SRF  Clinical management:  See below  Abbreviations: NFP - Normal foveal profile. CME - cystoid macular edema. PED - pigment epithelial detachment. IRF - intraretinal fluid. SRF - subretinal fluid. EZ - ellipsoid zone. ERM - epiretinal membrane. ORA - outer retinal atrophy. ORT - outer retinal tubulation. 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 Asymptomatic HIV infection ([place])  Z21   5. 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 Moderate nonproliferative diabetic retinopathy of both eyes without macular edema associated with type 2 diabetes mellitus ([place])  G29.5284   7. Retinal edema  H35.81 OCT, Retina - OU - Both Eyes  8.  Combined forms of age-related cataract of both eyes  H25.813   9. HIV infection, unspecified symptom status ([place])  B20     1,2,3. Ocular syphilis - Chorioretinitis, posterior uveitis OD -- stably resolved  - pt reports onset of symptoms (decreased vision) upon waking on Saturday 03/16/17  - presented on 03/20/17 with VA decreased to 20/50 and focal area of RPE and outer retinal atrophy from fovea extending nasally across disc  - initial OCT and fundus autofluorescence with focal outer retinal atrophy corresponding to exam  - FA showed window defect in area of atrophy + late leakage temporal to fovea and hyperfluorescence of the disc  - case discussed with Infectious Disease call physician, Dr. Carlyle Basques  - labs positive for RPR, VDRL, and FTA-Ab  - acute syphilitic posterior placoid chorioretinopathy (ASPPC)  - pt reports trunk, palmar and plantar rashes in July 2018, but had a negative RPR  - likely false negative in setting of HIV  - was admitted to [facility] and started IV PCN on 03/21/17  - discharged on 03/23/17 with PICC line for continued IV PCN at home -- completed 14 days of antibiotic therapy  - today, BCVA 20/15 OU and OCT shows full reconstitution of ellipsoid zone -- excellent response to IV PCN therapy   - no AC cell following completion of PF taper  - F/U 1-2 years  4. HIV infection  - diagnosed 20+ years ago  - contracted through unprotected sex with women and men  - has been on antiretrovirals for >10 yrs  - CD4 count 610, 01/26/2019  - HIV RNA viral load <40, 01/26/2019  - management per ID service  5. Optic neuropathy OD  - originally, had 2+ rAPD OD -- now resolved  - no rAPD today  - initial FA: + hyperfluorescence of disc consistent with inflammation  - retinal atrophy  surrounding disc improved  6. Moderate nonproliferative diabetic retinopathy w/o DME, both eyes  - exam with mild scattered MAs  - initial FA with scattered MAs, no NV  - OCT without  diabetic macular edema, both eyes   - f/u in 1 year  7. No retinal edema on exam or OCT  8. Combined forms of age-related cataract OU-   - The symptoms of cataract, surgical options, and treatments and risks were discussed with patient.  - discussed diagnosis and progression  - not yet visually significant  - monitor for now   Ophthalmic Meds Ordered this visit:  No orders of the defined types were placed in this encounter.      Return for f/u 1-2 years, DFE, OCT.  There are no Patient Instructions on file for this visit.   Explained the diagnoses, plan, and follow up with the patient and they expressed understanding.  Patient expressed understanding of the importance of proper follow up care.   This document serves as a record of services personally performed by Karie ChimeraBrian G. Jhordyn Hoopingarner, MD, PhD. It was created on their behalf by Laurian BrimAmanda Brown, OA, an ophthalmic assistant. The creation of this record is the provider's dictation and/or activities during the visit.    Electronically signed by: Laurian BrimAmanda Brown, OA 12.16.2020 10:47 AM   Karie ChimeraBrian G. Rateel Beldin, M.D., Ph.D. Diseases & Surgery of the Retina and Vitreous [facility]  I have reviewed the above documentation for accuracy and completeness, and I agree with the above. Karie ChimeraBrian G. Braidan Ricciardi, M.D., Ph.D. 02/19/19 10:47 AM   Abbreviations: M myopia (nearsighted); A astigmatism; H hyperopia (farsighted); P presbyopia; Mrx spectacle prescription;  CTL contact lenses; OD right eye; OS left eye; OU both eyes  XT exotropia; ET esotropia; PEK punctate epithelial keratitis; PEE punctate epithelial erosions; DES dry eye syndrome; MGD meibomian gland dysfunction; ATs artificial tears; PFAT's preservative free artificial tears; NSC nuclear sclerotic cataract; PSC posterior subcapsular cataract; ERM epi-retinal membrane; PVD posterior vitreous detachment; RD retinal detachment; DM diabetes mellitus; DR diabetic retinopathy; NPDR  non-proliferative diabetic retinopathy; PDR proliferative diabetic retinopathy; CSME clinically significant macular edema; DME diabetic macular edema; dbh dot blot hemorrhages; CWS cotton wool spot; POAG primary open angle glaucoma; C/D cup-to-disc ratio; HVF humphrey visual field; GVF goldmann visual field; OCT optical coherence tomography; IOP intraocular pressure; BRVO Branch retinal vein occlusion; CRVO central retinal vein occlusion; CRAO central retinal artery occlusion; BRAO branch retinal artery occlusion; RT retinal tear; SB scleral buckle; PPV pars plana vitrectomy; VH Vitreous hemorrhage; PRP panretinal laser photocoagulation; IVK intravitreal kenalog; VMT vitreomacular traction; MH Macular hole;  NVD neovascularization of the disc; NVE neovascularization elsewhere; AREDS age related eye disease study; ARMD age related macular degeneration; POAG primary open angle glaucoma; EBMD epithelial/anterior basement membrane dystrophy; ACIOL anterior chamber intraocular lens; IOL intraocular lens; PCIOL posterior chamber intraocular lens; Phaco/IOL phacoemulsification with intraocular lens placement; PRK photorefractive keratectomy; LASIK laser assisted in situ keratomileusis; HTN hypertension; DM diabetes mellitus; COPD chronic obstructive pulmonary disease

## 2019-02-18 ENCOUNTER — Ambulatory Visit (INDEPENDENT_AMBULATORY_CARE_PROVIDER_SITE_OTHER): Payer: Medicare Other | Admitting: Ophthalmology

## 2019-02-18 ENCOUNTER — Encounter (INDEPENDENT_AMBULATORY_CARE_PROVIDER_SITE_OTHER): Payer: Medicare Other | Admitting: Ophthalmology

## 2019-02-18 ENCOUNTER — Encounter (INDEPENDENT_AMBULATORY_CARE_PROVIDER_SITE_OTHER): Payer: Self-pay | Admitting: Ophthalmology

## 2019-02-18 ENCOUNTER — Other Ambulatory Visit: Payer: Self-pay

## 2019-02-18 DIAGNOSIS — Z21 Asymptomatic human immunodeficiency virus [HIV] infection status: Secondary | ICD-10-CM

## 2019-02-18 DIAGNOSIS — H3581 Retinal edema: Secondary | ICD-10-CM | POA: Diagnosis not present

## 2019-02-18 DIAGNOSIS — H3091 Unspecified chorioretinal inflammation, right eye: Secondary | ICD-10-CM

## 2019-02-18 DIAGNOSIS — H469 Unspecified optic neuritis: Secondary | ICD-10-CM

## 2019-02-18 DIAGNOSIS — A5271 Late syphilitic oculopathy: Secondary | ICD-10-CM

## 2019-02-18 DIAGNOSIS — E113393 Type 2 diabetes mellitus with moderate nonproliferative diabetic retinopathy without macular edema, bilateral: Secondary | ICD-10-CM

## 2019-02-18 DIAGNOSIS — B20 Human immunodeficiency virus [HIV] disease: Secondary | ICD-10-CM

## 2019-02-18 DIAGNOSIS — H25813 Combined forms of age-related cataract, bilateral: Secondary | ICD-10-CM

## 2019-02-19 ENCOUNTER — Encounter (INDEPENDENT_AMBULATORY_CARE_PROVIDER_SITE_OTHER): Payer: Self-pay | Admitting: Ophthalmology

## 2019-03-13 DIAGNOSIS — H47293 Other optic atrophy, bilateral: Secondary | ICD-10-CM | POA: Diagnosis not present

## 2019-03-13 DIAGNOSIS — H2513 Age-related nuclear cataract, bilateral: Secondary | ICD-10-CM | POA: Diagnosis not present

## 2019-03-13 DIAGNOSIS — H40013 Open angle with borderline findings, low risk, bilateral: Secondary | ICD-10-CM | POA: Diagnosis not present

## 2019-03-14 ENCOUNTER — Other Ambulatory Visit: Payer: Self-pay | Admitting: Family Medicine

## 2019-03-14 DIAGNOSIS — E1169 Type 2 diabetes mellitus with other specified complication: Secondary | ICD-10-CM

## 2019-03-29 ENCOUNTER — Other Ambulatory Visit: Payer: Self-pay | Admitting: Family Medicine

## 2019-03-29 DIAGNOSIS — E1159 Type 2 diabetes mellitus with other circulatory complications: Secondary | ICD-10-CM

## 2019-04-08 ENCOUNTER — Other Ambulatory Visit: Payer: Self-pay | Admitting: Family

## 2019-04-08 DIAGNOSIS — F419 Anxiety disorder, unspecified: Secondary | ICD-10-CM

## 2019-04-09 ENCOUNTER — Telehealth: Payer: Self-pay

## 2019-04-09 ENCOUNTER — Other Ambulatory Visit: Payer: Self-pay | Admitting: Family Medicine

## 2019-04-09 DIAGNOSIS — E781 Pure hyperglyceridemia: Secondary | ICD-10-CM

## 2019-04-09 DIAGNOSIS — F419 Anxiety disorder, unspecified: Secondary | ICD-10-CM

## 2019-04-09 DIAGNOSIS — E1169 Type 2 diabetes mellitus with other specified complication: Secondary | ICD-10-CM

## 2019-04-09 DIAGNOSIS — I2510 Atherosclerotic heart disease of native coronary artery without angina pectoris: Secondary | ICD-10-CM

## 2019-04-09 DIAGNOSIS — E782 Mixed hyperlipidemia: Secondary | ICD-10-CM

## 2019-04-09 NOTE — Telephone Encounter (Signed)
Refill request for Alprazolam. Routing to provider for approval.  Jonathan Estes

## 2019-04-09 NOTE — Telephone Encounter (Signed)
Routed to [employer] in telephone noted for approval.

## 2019-04-10 MED ORDER — ALPRAZOLAM 0.5 MG PO TABS: 0.5000 mg | ORAL_TABLET | Freq: Three times a day (TID) | ORAL | 1 refills | Status: DC

## 2019-04-10 NOTE — Addendum Note (Signed)
Addended by: Jeanine Luz D on: 04/10/2019 11:57 AM   Modules accepted: Orders

## 2019-04-10 NOTE — Telephone Encounter (Signed)
Medication sent.

## 2019-04-10 NOTE — Telephone Encounter (Signed)
My imprivata is not working yet (going to be fixed on Monday) Tammy Sours can you do this one for me?

## 2019-04-21 ENCOUNTER — Other Ambulatory Visit: Payer: Self-pay | Admitting: Family Medicine

## 2019-04-21 DIAGNOSIS — E782 Mixed hyperlipidemia: Secondary | ICD-10-CM

## 2019-04-21 DIAGNOSIS — E1169 Type 2 diabetes mellitus with other specified complication: Secondary | ICD-10-CM

## 2019-04-26 ENCOUNTER — Other Ambulatory Visit: Payer: Self-pay | Admitting: Family Medicine

## 2019-04-26 DIAGNOSIS — E1159 Type 2 diabetes mellitus with other circulatory complications: Secondary | ICD-10-CM

## 2019-04-26 DIAGNOSIS — I152 Hypertension secondary to endocrine disorders: Secondary | ICD-10-CM

## 2019-05-02 ENCOUNTER — Other Ambulatory Visit: Payer: Self-pay | Admitting: Family Medicine

## 2019-05-02 DIAGNOSIS — I2510 Atherosclerotic heart disease of native coronary artery without angina pectoris: Secondary | ICD-10-CM

## 2019-05-02 DIAGNOSIS — E781 Pure hyperglyceridemia: Secondary | ICD-10-CM

## 2019-05-02 DIAGNOSIS — E1169 Type 2 diabetes mellitus with other specified complication: Secondary | ICD-10-CM

## 2019-05-02 DIAGNOSIS — E782 Mixed hyperlipidemia: Secondary | ICD-10-CM

## 2019-05-06 ENCOUNTER — Other Ambulatory Visit: Payer: Self-pay

## 2019-05-06 NOTE — Patient Outreach (Signed)
[facility]) Care Management  05/06/2019  Jonathan Estes [birth year] [id number]   [name] HIPPA Compliant Voice message left with a call back number. Jonathan Estes is showing past due on Pravastatin 20 mg under [employer].  Lillia Abed CPhT Pharmacy Technician [facility] Management Direct Dial [phone]  Fax [phone] [email or web address]

## 2019-05-08 ENCOUNTER — Telehealth: Payer: Self-pay | Admitting: Family Medicine

## 2019-05-08 ENCOUNTER — Telehealth: Payer: Self-pay

## 2019-05-08 ENCOUNTER — Other Ambulatory Visit: Payer: Self-pay | Admitting: Family Medicine

## 2019-05-08 DIAGNOSIS — E038 Other specified hypothyroidism: Secondary | ICD-10-CM

## 2019-05-08 DIAGNOSIS — I10 Essential (primary) hypertension: Secondary | ICD-10-CM

## 2019-05-08 NOTE — Telephone Encounter (Signed)
Well it's beyond our control

## 2019-05-08 NOTE — Telephone Encounter (Signed)
Per Dr. Sharee Holster we are unable to give any more refills (per our refill protocol) until patient is seen.   Advised patient that we would be unable to give further refills due to refill protocol and that patient had been given 30 days and an additional 15 days and advised to schedule apt. Patient then became very upset and states that "this is because we dont let him know he needs to be seen and that he is pissed at [facility] all together because we wont give him his vaccine and now we wont give him his medicines" I tried to explain the issue with the medication refill to the patient and he cut me off saying "im ill now and I dont want to keep talking because im not going to be polite. Im sick of [place], of the government and especially [facility] and ill call you back when I feel like talking-have a nice weekend" and the call was disconnected. AS, CMA

## 2019-05-08 NOTE — Telephone Encounter (Signed)
Patient currently does not meet criteria for vaccine. Is waiting for stage 4 to begin to get vaccinated. Provided resources on how to register for vaccine on phone and online.  Patient was not satisfied with this; states it's a hassle. Lorenso Courier, [place]

## 2019-05-08 NOTE — Telephone Encounter (Signed)
Isn't there also a phone number he can call?

## 2019-05-08 NOTE — Telephone Encounter (Signed)
Patient called states due to his Health status & inability to get the COVID Vaccine he will "NOT " be coming to this appt, states he has been trying to get vaccine but told does not meet criteria yet .  --Advised pt would send message to med asst & provider to see if CPE appt can be changed to a TELEHEALTH or DOXY due to the need of Rx refills.  Also share COVID vaccine clinic site info for [place] [place] [facility] & this weekend site @ [address] [place] .  --pt request refill  90dys on :  pravastatin (PRAVACHOL) 20 MG tablet [[id number]]   Order Details Dose, Route, Frequency: As Directed  Dispense Quantity: 15 tablet Refills: 0       Sig: TAKE 1 TABLET BY MOUTH EVERY DAY AT BEDTIME **PATIENT NEEDS APT FOR FURTHER REFILLS**       Please advise  --glh

## 2019-05-08 NOTE — Telephone Encounter (Signed)
Patient called office today x2 in regards to covid vaccine. Would like to know if he would be able to get it at our office or if we could help him scheduled appointment to get this done.  Informed patient that office does not offer vaccine. Advised patient to register for vaccine through [employer] site once he can. Patient states he does not have a computer to do this and is not sure how to register online. Encouraged patient to have someone help him register for vaccine once he qualifies or contact health department for vaccine. Patient states he is unsatisfied with [facility] and how to register for vaccine.  Apologized to patient but reminded him that our office and covid vaccine clinic are separate.  Lorenso Courier, [place]

## 2019-05-17 ENCOUNTER — Other Ambulatory Visit: Payer: Self-pay | Admitting: Family Medicine

## 2019-05-17 DIAGNOSIS — E1169 Type 2 diabetes mellitus with other specified complication: Secondary | ICD-10-CM

## 2019-05-17 DIAGNOSIS — E782 Mixed hyperlipidemia: Secondary | ICD-10-CM

## 2019-05-17 DIAGNOSIS — E781 Pure hyperglyceridemia: Secondary | ICD-10-CM

## 2019-05-17 DIAGNOSIS — I2510 Atherosclerotic heart disease of native coronary artery without angina pectoris: Secondary | ICD-10-CM

## 2019-05-22 ENCOUNTER — Other Ambulatory Visit: Payer: Self-pay | Admitting: Family Medicine

## 2019-05-22 DIAGNOSIS — E1159 Type 2 diabetes mellitus with other circulatory complications: Secondary | ICD-10-CM

## 2019-05-26 ENCOUNTER — Other Ambulatory Visit: Payer: Self-pay

## 2019-05-26 ENCOUNTER — Encounter: Payer: Self-pay | Admitting: Family Medicine

## 2019-05-26 ENCOUNTER — Ambulatory Visit (INDEPENDENT_AMBULATORY_CARE_PROVIDER_SITE_OTHER): Payer: Medicare Other | Admitting: Family Medicine

## 2019-05-26 VITALS — BP 120/67 | HR 68 | Ht 68.0 in | Wt 200.0 lb

## 2019-05-26 DIAGNOSIS — E119 Type 2 diabetes mellitus without complications: Secondary | ICD-10-CM | POA: Diagnosis not present

## 2019-05-26 DIAGNOSIS — E782 Mixed hyperlipidemia: Secondary | ICD-10-CM

## 2019-05-26 DIAGNOSIS — I10 Essential (primary) hypertension: Secondary | ICD-10-CM

## 2019-05-26 DIAGNOSIS — E1159 Type 2 diabetes mellitus with other circulatory complications: Secondary | ICD-10-CM | POA: Diagnosis not present

## 2019-05-26 DIAGNOSIS — E1169 Type 2 diabetes mellitus with other specified complication: Secondary | ICD-10-CM

## 2019-05-26 DIAGNOSIS — E038 Other specified hypothyroidism: Secondary | ICD-10-CM

## 2019-05-26 DIAGNOSIS — I2510 Atherosclerotic heart disease of native coronary artery without angina pectoris: Secondary | ICD-10-CM | POA: Diagnosis not present

## 2019-05-26 DIAGNOSIS — E781 Pure hyperglyceridemia: Secondary | ICD-10-CM

## 2019-05-26 DIAGNOSIS — I152 Hypertension secondary to endocrine disorders: Secondary | ICD-10-CM

## 2019-05-26 DIAGNOSIS — E559 Vitamin D deficiency, unspecified: Secondary | ICD-10-CM

## 2019-05-26 MED ORDER — LEVOTHYROXINE SODIUM 25 MCG PO TABS: ORAL_TABLET | ORAL | 0 refills | Status: DC

## 2019-05-26 MED ORDER — HYDROCHLOROTHIAZIDE 12.5 MG PO CAPS: ORAL_CAPSULE | ORAL | 0 refills | Status: DC

## 2019-05-26 MED ORDER — GEMFIBROZIL 600 MG PO TABS: 600.0000 mg | ORAL_TABLET | Freq: Two times a day (BID) | ORAL | 0 refills | Status: DC

## 2019-05-26 MED ORDER — PRAVASTATIN SODIUM 20 MG PO TABS: ORAL_TABLET | ORAL | 0 refills | Status: DC

## 2019-05-26 MED ORDER — METFORMIN HCL 500 MG PO TABS: 250.0000 mg | ORAL_TABLET | Freq: Two times a day (BID) | ORAL | 0 refills | Status: DC

## 2019-05-26 MED ORDER — LISINOPRIL 40 MG PO TABS: 40.0000 mg | ORAL_TABLET | Freq: Every day | ORAL | 0 refills | Status: DC

## 2019-05-26 NOTE — Progress Notes (Signed)
Telehealth office visit note for Jonathan Estes, D.O- at Primary Care at [facility]   I connected with current patient today and verified that I am speaking with the correct person   . Location of the patient: Home . Location of the provider: Office - This visit type was conducted due to national recommendations for restrictions regarding the COVID-19 Pandemic (e.g. social distancing) in an effort to limit this patient's exposure and mitigate transmission in our community.    - No physical exam could be performed with this format, beyond that communicated to [place] by the patient/ family members as noted.   - Additionally my office staff/ schedulers were to discuss with the patient that there may be a monetary charge related to this service, depending on their medical insurance.  My understanding is that patient understood and consented to proceed.     _________________________________________________________________________________   History of Present Illness:  I, Toni Amend, am serving as Education administrator for [employer].  Notes he scheduled a tele-visit today because he is not yet ready to attend a doctor's visit in person.  The patient has obtained his first dose of COVID-19 vaccination this past Sunday, and is due for his second on April 11th.  He is feeling frustrated with the response/guidance he's received from [facility] regarding the COVID-19 vaccinations.   - Vitamin D He continues taking his 5000 IU's of Vitamin D every morning.  No complaints   HPI:   Diabetes Mellitus:  Home glucose readings:  Has not been checking his blood sugar, but notes he is currently asymptomatic.  When he was checking his blood sugars at home, notes "it was never over 130 two hours after eating."   - Patient reports good compliance with therapy plan: medication and/or lifestyle modification  - His denies acute concerns or problems related to treatment plan  - He denies new concerns.   Denies polyuria/polydipsia, hypo/ hyperglycemia symptoms.  Denies new onset of: chest pain, exercise intolerance, shortness of breath, dizziness, visual changes, headache, lower extremity swelling or claudication.   Last A1C in the office was:  Lab Results  Component Value Date   HGBA1C 6.1 (H) 07/14/2018   HGBA1C 5.9 (A) 12/16/2017   HGBA1C 5.5 07/23/2017   Lab Results  Component Value Date   MICROALBUR <0.2 08/12/2017   [place] 63 01/26/2019   CREATININE 1.18 01/26/2019   BP Readings from Last 3 Encounters:  05/26/19 120/67  01/26/19 112/74  12/10/18 118/65   Wt Readings from Last 3 Encounters:  05/26/19 200 lb (90.7 kg)  01/26/19 200 lb (90.7 kg)  12/10/18 195 lb (88.5 kg)    HPI:  Hypertension:  -  His blood pressure at home has been running: 120/76 on average.  - Patient reports good compliance with medication and/or lifestyle modification  - His denies acute concerns or problems related to treatment plan  - He denies new onset of: chest pain, exercise intolerance, shortness of breath, dizziness, visual changes, headache, lower extremity swelling or claudication.   Last 3 blood pressure readings in our office are as follows: BP Readings from Last 3 Encounters:  05/26/19 120/67  01/26/19 112/74  12/10/18 118/65   Filed Weights   05/26/19 0838  Weight: 200 lb (90.7 kg)    HPI:  Hyperlipidemia:  64 y.o. male here for cholesterol follow-up.   - Patient reports good compliance with treatment plan of:  medication and/ or lifestyle management.    Takes his medicine "if I  don't fall asleep on the couch and forget to take it."  Notes he's been eating a lot of oatmeal in an attempt to control his cholesterol.  - Patient denies any acute concerns or problems with management plan   - He denies new onset of: myalgias, arthralgias, increased fatigue more than normal, chest pains, exercise intolerance, shortness of breath, dizziness, visual changes, headache, lower  extremity swelling or claudication.   Most recent cholesterol panel was:  Lab Results  Component Value Date   CHOL 136 01/26/2019   HDL 49 01/26/2019   LDLCALC 63 01/26/2019   TRIG 159 (H) 01/26/2019   CHOLHDL 2.8 01/26/2019   Hepatic Function Latest Ref Rng & Units 01/26/2019 07/30/2018 07/14/2018  Total Protein 6.1 - 8.1 g/dL 7.7 7.6 7.3  Albumin 3.8 - 4.9 g/dL - - 4.6  AST 10 - 35 U/L '12 14 14  ' ALT 9 - 46 U/L 6(L) 8(L) 10  Alk Phosphatase 39 - 117 IU/L - - 102  Total Bilirubin 0.2 - 1.2 mg/dL 0.3 0.4 0.2  Bilirubin, Direct 0.0 - 0.3 mg/dL - - -         GAD 7 : Generalized Anxiety Score 07/17/2018 07/23/2016  Nervous, Anxious, on Edge 0 1  Control/stop worrying 0 1  Worry too much - different things 0 1  Trouble relaxing 0 1  Restless 0 1  Easily annoyed or irritable 0 1  Afraid - awful might happen 0 0  Total GAD 7 Score 0 6  Anxiety Difficulty Not difficult at all Somewhat difficult    Depression screen [facility] 2/9 05/26/2019 07/17/2018 01/21/2018 12/16/2017 08/13/2017  Decreased Interest 0 0 0 0 0  Down, Depressed, Hopeless 0 0 0 0 0  PHQ - 2 Score 0 0 0 0 0  Altered sleeping 0 0 0 0 0  Tired, decreased energy 0 0 0 0 0  Change in appetite 0 0 0 1 0  Feeling bad or failure about yourself  0 0 0 0 0  Trouble concentrating 0 0 0 0 0  Moving slowly or fidgety/restless 0 0 0 0 0  Suicidal thoughts 0 0 0 0 0  PHQ-9 Score 0 0 0 1 0  Difficult doing work/chores - Not difficult at all Not difficult at all Not difficult at all Not difficult at all  Some recent data might be hidden      Impression and Recommendations:     1. Controlled type 2 diabetes mellitus without complication, without long-term current use of insulin ([place])   2. Mixed diabetic hyperlipidemia associated with type 2 diabetes mellitus ([place])   3. Hypertension associated with diabetes ([place])   4. Other specified hypothyroidism   5. Dyslipidemia with low high density lipoprotein (HDL) cholesterol with  hypertriglyceridemia due to type 2 diabetes mellitus ([place])   6. Coronary artery disease involving native heart without angina pectoris, unspecified vessel or lesion type   7. Hypertriglyceridemia   8. Hypertension, unspecified type   9. Vitamin D deficiency       - Last labs on record obtained 01/27/2019 (CMP, FLP), and last labs at the clinic obtained in 5 of 2020.  Discussed that patient's kidney function and liver function were WNL last check.  - Strongly advised patient to return near future to update his lab work.   Type 2 Diabetes Mellitus - Last A1c obtained 07/14/2018, 6.1 last check, stable. - Need for re-check A1c near future.  - A1c in 5 of 2020 was at goal. -  If desired, advised patient of option of home A1c testing.  - Pt will continue current treatment regimen.  See med list.  - Counseled patient on pathophysiology of disease and discussed various treatment options, which always includes dietary and lifestyle modification as first line.    - Importance of low carb, heart-healthy diet discussed with patient in addition to regular aerobic exercise of 47mn 5d/week or more.   - Check FBS and 2 hours after the biggest meal of your day.  Keep log and bring in next OV for my review.     - Also told patient if you ever feel poorly, please check your blood pressure and blood sugar, as one or the other could be the cause of your symptoms.  - Pt reminded about need for yearly eye and foot exams.  Told patient to make appt.for diabetic eye exam, CMAs here will do foot exams  - Will continue to monitor and re-check as discussed.   Mixed Diabetic Hyperlipidemia associated with type 2 DM, Dyslipidemia with low HDL, Hypertriglyceridemia due to type 2 DM - Discussed that last FLP, patient's LDL was at goal at 63. - Patient's triglycerides were elevated last check at 159.  - Pt will continue current treatment regimen.  See med list.  - Encouraged prudent dietary changes such as  low saturated & trans fat diets for hyperlipidemia and low carb diets for hypertriglyceridemia.    - Encouraged patient to follow AHA guidelines for regular exercise and also engage in weight loss if BMI above 25.   - Will continue to monitor and re-check as discussed.   Hypertension associated with DM - Blood pressure currently is at goal, stable.  - Patient will continue current treatment regimen.  See med list.  - Counseled patient on pathophysiology of disease and discussed various treatment options, which always includes dietary and lifestyle modification as first line.   - Lifestyle changes such as dash and heart healthy diets and engaging in a regular exercise program discussed extensively with patient.   - Ambulatory blood pressure monitoring encouraged at least 3 times weekly.  Keep log and bring in every office visit.  Reminded patient that if they ever feel poorly in any way, to check their blood pressure and pulse.  - We will continue to monitor.   Vitamin D Deficiency - Continue 5000 IU's daily OTC. - Will continue to monitor and re-check as discussed.   Recommendations - Come in 5-6 weeks (around April 26) for A1c, thyroid, Vitamin D, urine micro..     - As part of my medical decision making, I reviewed the following data [place] the [place] obtained from pt /family, CMA notes reviewed and incorporated if applicable, Labs reviewed, Radiograph/ tests reviewed if applicable and OV notes from prior OV's with me, as well as other specialists she/he has seen since seeing me last, were all reviewed and used in my medical decision making process today.    - Additionally, when appropriate, discussion had with patient regarding our treatment plan, and their biases/concerns about that plan were used in my medical decision making today.    - The patient agreed with the plan and demonstrated an understanding of the instructions.   No barriers to  understanding were identified.     - The patient was advised to call back or seek an in-person evaluation if the symptoms worsen or if the condition fails to improve as anticipated.   Return for f/up in 5-6 weeks  for urine micro, A1c, TSH, free T4, and Vitamin D, chronic f/up 4 mo.    No orders of the defined types were placed in this encounter.   Meds ordered this encounter  Medications  . pravastatin (PRAVACHOL) 20 MG tablet    Sig: TAKE 1 TABLET BY MOUTH EVERY DAY AT BEDTIME    Dispense:  90 tablet    Refill:  0  . levothyroxine (SYNTHROID) 25 MCG tablet    Sig: TAKE 1 TABLET BY MOUTH EVERY DAY ON AN EMPTY STOMACH    Dispense:  90 tablet    Refill:  0  . hydrochlorothiazide (MICROZIDE) 12.5 MG capsule    Sig: TAKE 1 CAPSULE BY MOUTH EVERY DAY    Dispense:  90 capsule    Refill:  0  . metFORMIN (GLUCOPHAGE) 500 MG tablet    Sig: Take 0.5 tablets (250 mg total) by mouth 2 (two) times daily with a meal.    Dispense:  90 tablet    Refill:  0  . gemfibrozil (LOPID) 600 MG tablet    Sig: Take 1 tablet (600 mg total) by mouth 2 (two) times daily. **PATIENT NEEDS APT FOR FURTHER REFILLS**    Dispense:  30 tablet    Refill:  0  . lisinopril (ZESTRIL) 40 MG tablet    Sig: Take 1 tablet (40 mg total) by mouth daily.    Dispense:  90 tablet    Refill:  0    DX Code Needed  .    Medications Discontinued During This Encounter  Medication Reason  . lisinopril (ZESTRIL) 40 MG tablet Reorder  . metFORMIN (GLUCOPHAGE) 500 MG tablet Reorder  . pravastatin (PRAVACHOL) 20 MG tablet Reorder  . gemfibrozil (LOPID) 600 MG tablet Reorder  . levothyroxine (SYNTHROID) 25 MCG tablet Reorder  . hydrochlorothiazide (MICROZIDE) 12.5 MG capsule Reorder      Time spent on visit including pre-visit chart review and post-visit care was 21+ minutes.   Note:  This note was prepared with assistance of Dragon voice recognition software. Occasional wrong-word or sound-a-like substitutions may have  occurred due to the inherent limitations of voice recognition software.  The [place] was signed into law in 2016 which includes the topic of electronic health records.  This provides immediate access to information in MyChart.  This includes consultation notes, operative notes, office notes, lab results and pathology reports.  If you have any questions about what you read please let us know at your next visit or call us at the office.  We are right here with you.  This document serves as a record of services personally performed by Jonathan Dance, DO. It was created on her behalf by Toni Amend, a trained medical scribe. The creation of this record is based on the scribe's personal observations and the provider's statements to them.    The above documentation from Toni Amend, medical scribe, has been reviewed by Marjory Sneddon, D.O.     __________________________________________________________________________________     Patient Care Team    Relationship Specialty Notifications Start End  Jonathan Dance, DO PCP - General Family Medicine  11/26/16   Inda Castle, MD (Inactive) Consulting Physician Gastroenterology  07/23/16   Tommy Medal, Lavell Islam, MD Consulting Physician Infectious Diseases  12/31/16    Comment: HIV doc  Beverely Low, OD Referring Physician Optometry  12/31/16      -Vitals obtained; medications/ allergies reconciled;  personal medical, social, Sx etc.histories were updated by CMA, reviewed by  me and are reflected in chart   Patient Active Problem List   Diagnosis Date Noted  . Microalbuminuria due to type 2 diabetes mellitus ([place]) 12/31/2016  . Dyslipidemia with low high density lipoprotein (HDL) cholesterol with hypertriglyceridemia due to type 2 diabetes mellitus ([place]) 07/23/2016  . Diabetic nephropathy associated with type 2 diabetes mellitus: Microalbuminuria 07/23/2016  . Mixed diabetic hyperlipidemia associated with  type 2 diabetes mellitus ([place]) 07/11/2010  . Controlled type 2 diabetes mellitus without complication, without long-term current use of insulin ([place]) 02/07/2009  . Hypertension associated with diabetes ([place]) 04/12/2008  . Family history of colon cancer in father- in 54's 11/26/2016  . CAD (coronary artery disease) 07/16/2011  . Hypothyroidism 04/24/2008  . Human immunodeficiency virus (HIV) disease ([place]) 10/14/2007  . Uveitis 03/21/2017  . Vitamin D deficiency 12/31/2016  . Environmental and seasonal allergies 07/30/2016  . Anxiety 01/29/2011  . DEPRESSION/ANXIETY 06/27/2009  . h/o COCAINE ABUSE, EPISODIC 10/14/2007  . CANNABIS ABUSE, HX OF 10/14/2007  . AKI (acute kidney injury) ([place]) 08/12/2017  . 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 Change in vision 03/21/2017  . Low level of high density lipoprotein (HDL) 12/30/2016  . Hypertriglyceridemia 12/30/2016  . External hemorrhoids without complication 04/54/0981  . Dyspnea 05/10/2011  . Abnormal chest CT 04/12/2011  . PULMONARY NODULE 02/21/2010  . ORBITAL FLOOR , CLOSED FRACTURE 02/21/2010  . UNSPECIFIED VISUAL LOSS 12/26/2009  . TRANSIENT GLOBAL AMNESIA 12/26/2009  . SHOULDER STRAIN 06/13/2009  . FATIGUE 04/12/2008  . METHICILLIN SUSCEPTIBLE STAPH INF CCE & UNS SITE 10/14/2007  . CANDIDIASIS OF MOUTH 10/14/2007  . PNEUMOCYSTIS PNEUMONIA 10/14/2007  . CUTANEOUS ERUPTIONS, DRUG-INDUCED 10/14/2007     Current Meds  Medication Sig  . ALPRAZolam (XANAX) 0.5 MG tablet Take 1 tablet (0.5 mg total) by mouth 3 (three) times daily.  . bictegravir-emtricitabine-tenofovir AF (BIKTARVY) 50-200-25 MG TABS tablet Take 1 tablet by mouth daily.  . cetirizine (ZYRTEC) 10 MG tablet TAKE 1 TABLET (10 MG TOTAL) BY MOUTH DAILY.  . [facility] LANCETS ORIGINAL MISC 1 application by Does not apply route daily.  Marland Kitchen gemfibrozil (LOPID) 600 MG tablet Take 1 tablet (600 mg total) by mouth 2 (two) times daily. **PATIENT NEEDS APT FOR FURTHER REFILLS**  . glucose blood  (ACCU-CHEK AVIVA) test strip Use as instructed  . hydrochlorothiazide (MICROZIDE) 12.5 MG capsule TAKE 1 CAPSULE BY MOUTH EVERY DAY  . levothyroxine (SYNTHROID) 25 MCG tablet TAKE 1 TABLET BY MOUTH EVERY DAY ON AN EMPTY STOMACH  . lisinopril (ZESTRIL) 40 MG tablet Take 1 tablet (40 mg total) by mouth daily.  . metFORMIN (GLUCOPHAGE) 500 MG tablet Take 0.5 tablets (250 mg total) by mouth 2 (two) times daily with a meal.  . Misc Natural Products (OSTEO BI-FLEX JOINT SHIELD PO) Take 1 tablet by mouth daily.  . Multiple Vitamins-Minerals (ICAPS AREDS 2 PO) Take by mouth.  . pravastatin (PRAVACHOL) 20 MG tablet TAKE 1 TABLET BY MOUTH EVERY DAY AT BEDTIME  . Vitamin D, Ergocalciferol, (DRISDOL) 1.25 MG (50000 UT) CAPS capsule Take one tablet wkly  . [DISCONTINUED] gemfibrozil (LOPID) 600 MG tablet TAKE 1 TABLET (600 MG TOTAL) BY MOUTH 2 (TWO) TIMES DAILY. **PATIENT NEEDS APT FOR FURTHER REFILLS**  . [DISCONTINUED] hydrochlorothiazide (MICROZIDE) 12.5 MG capsule TAKE 1 CAPSULE BY MOUTH EVERY DAY *PATIENT NEEDS APPT FOR FURTHER REFILLS*  . [DISCONTINUED] levothyroxine (SYNTHROID) 25 MCG tablet TAKE 1 TABLET BY MOUTH EVERY DAY ON AN EMPTY STOMACH**PATIENT NEEDS APT FOR FURTHER REFILLS**  . [DISCONTINUED] lisinopril (ZESTRIL) 40 MG tablet  TAKE 1 TABLET BY MOUTH EVERY DAY  . [DISCONTINUED] metFORMIN (GLUCOPHAGE) 500 MG tablet Take 0.5 tablets (250 mg total) by mouth 2 (two) times daily with a meal.  . [DISCONTINUED] pravastatin (PRAVACHOL) 20 MG tablet TAKE 1 TABLET BY MOUTH EVERY DAY AT BEDTIME **PATIENT NEEDS APT FOR FURTHER REFILLS**     Allergies:  Allergies  Allergen Reactions  . Sulfamethoxazole-Trimethoprim     REACTION: severe rash     ROS:  See above HPI for pertinent positives and negatives   Objective:   Blood pressure 120/67, pulse 68, height '5\' 8"'  (1.727 m), weight 200 lb (90.7 kg).  (if some vitals are omitted, this means that patient was UNABLE to obtain them even though they were  asked to get them prior to OV today.  They were asked to call us at their earliest convenience with these once obtained. ) General: A & O * 3; sounds in no acute distress; in usual state of health.  Skin: Pt confirms warm and dry extremities and pink fingertips HEENT: Pt confirms lips non-cyanotic Chest: Patient confirms normal chest excursion and movement Respiratory: speaking in full sentences, no conversational dyspnea; patient confirms no use of accessory muscles Psych: insight appears good, mood- appears full

## 2019-06-02 ENCOUNTER — Other Ambulatory Visit: Payer: Self-pay | Admitting: Family Medicine

## 2019-06-02 DIAGNOSIS — E038 Other specified hypothyroidism: Secondary | ICD-10-CM

## 2019-06-29 ENCOUNTER — Telehealth: Payer: Self-pay

## 2019-06-29 ENCOUNTER — Other Ambulatory Visit: Payer: Self-pay

## 2019-06-29 ENCOUNTER — Other Ambulatory Visit: Payer: Medicare Other

## 2019-06-29 ENCOUNTER — Other Ambulatory Visit: Payer: Self-pay | Admitting: Family

## 2019-06-29 DIAGNOSIS — F419 Anxiety disorder, unspecified: Secondary | ICD-10-CM

## 2019-06-29 DIAGNOSIS — E1169 Type 2 diabetes mellitus with other specified complication: Secondary | ICD-10-CM

## 2019-06-29 DIAGNOSIS — E039 Hypothyroidism, unspecified: Secondary | ICD-10-CM

## 2019-06-29 DIAGNOSIS — E1129 Type 2 diabetes mellitus with other diabetic kidney complication: Secondary | ICD-10-CM

## 2019-06-29 DIAGNOSIS — E782 Mixed hyperlipidemia: Secondary | ICD-10-CM | POA: Diagnosis not present

## 2019-06-29 DIAGNOSIS — E119 Type 2 diabetes mellitus without complications: Secondary | ICD-10-CM

## 2019-06-29 DIAGNOSIS — E559 Vitamin D deficiency, unspecified: Secondary | ICD-10-CM

## 2019-06-29 DIAGNOSIS — R809 Proteinuria, unspecified: Secondary | ICD-10-CM

## 2019-06-29 MED ORDER — ALPRAZOLAM 0.5 MG PO TABS: 0.5000 mg | ORAL_TABLET | Freq: Three times a day (TID) | ORAL | 0 refills | Status: DC

## 2019-06-29 NOTE — Telephone Encounter (Signed)
Medication refilled

## 2019-06-29 NOTE — Telephone Encounter (Signed)
Pharmacy is requesting refill on patient's Alprazolam disp 90 tabs 1 refill.Will forward to FNP. Lorenso Courier, [place]

## 2019-06-29 NOTE — Addendum Note (Signed)
Addended by: Jeanine Luz D on: 06/29/2019 01:37 PM   Modules accepted: Orders

## 2019-06-30 LAB — VITAMIN D 25 HYDROXY (VIT D DEFICIENCY, FRACTURES): Vit D, 25-Hydroxy: 57.2 ng/mL (ref 30.0–100.0)

## 2019-06-30 LAB — MICROALBUMIN / CREATININE URINE RATIO
Creatinine, Urine: 24.9 mg/dL
Microalb/Creat Ratio: 22 mg/g creat (ref 0–29)
Microalbumin, Urine: 5.5 ug/mL

## 2019-06-30 LAB — HEMOGLOBIN A1C
Est. average glucose Bld gHb Est-mCnc: 131 mg/dL
Hgb A1c MFr Bld: 6.2 % — ABNORMAL HIGH (ref 4.8–5.6)

## 2019-06-30 LAB — T4, FREE: Free T4: 1.24 ng/dL (ref 0.82–1.77)

## 2019-06-30 LAB — TSH: TSH: 3.38 u[IU]/mL (ref 0.450–4.500)

## 2019-07-13 ENCOUNTER — Other Ambulatory Visit: Payer: Self-pay | Admitting: Family Medicine

## 2019-07-13 DIAGNOSIS — E1169 Type 2 diabetes mellitus with other specified complication: Secondary | ICD-10-CM

## 2019-07-13 DIAGNOSIS — E781 Pure hyperglyceridemia: Secondary | ICD-10-CM

## 2019-07-13 DIAGNOSIS — E782 Mixed hyperlipidemia: Secondary | ICD-10-CM

## 2019-07-27 ENCOUNTER — Other Ambulatory Visit: Payer: Self-pay | Admitting: Infectious Disease

## 2019-07-27 DIAGNOSIS — B20 Human immunodeficiency virus [HIV] disease: Secondary | ICD-10-CM

## 2019-08-04 ENCOUNTER — Other Ambulatory Visit: Payer: Self-pay | Admitting: Physician Assistant

## 2019-08-04 DIAGNOSIS — E781 Pure hyperglyceridemia: Secondary | ICD-10-CM

## 2019-08-04 DIAGNOSIS — E1169 Type 2 diabetes mellitus with other specified complication: Secondary | ICD-10-CM

## 2019-08-04 MED ORDER — GEMFIBROZIL 600 MG PO TABS: 600.0000 mg | ORAL_TABLET | Freq: Two times a day (BID) | ORAL | 0 refills | Status: DC

## 2019-08-04 NOTE — Telephone Encounter (Signed)
Patient called request refill on :   Pt's  LOV 05/26/19   gemfibrozil (LOPID) 600 MG tablet [[id number]] DISCONTINUED  Order Details Dose: 600 mg Route: Oral Frequency: 2 times daily  Dispense Quantity: 60 tablet Refills: 0       Sig: Take 1 tablet (600 mg total) by mouth 2 (two) times daily. **PATIENT NEEDS APT FOR FURTHER REFILLS**   [facility] :   [phone] [place], [place] - [address] AT [facility] [phone] (Phone) [phone] (Fax   ---- Pls call pt if there are any question.  --glh

## 2019-08-12 ENCOUNTER — Other Ambulatory Visit: Payer: Self-pay

## 2019-08-12 ENCOUNTER — Other Ambulatory Visit: Payer: Self-pay | Admitting: Family

## 2019-08-12 DIAGNOSIS — F419 Anxiety disorder, unspecified: Secondary | ICD-10-CM

## 2019-08-12 MED ORDER — ALPRAZOLAM 0.5 MG PO TABS: 0.5000 mg | ORAL_TABLET | Freq: Three times a day (TID) | ORAL | 0 refills | Status: DC

## 2019-08-12 NOTE — Progress Notes (Signed)
[facility] at 3:26 pm. Spoke with pharmacy team who was able to take prescription over the phone. Lorenso Courier, [place]

## 2019-08-17 ENCOUNTER — Ambulatory Visit (INDEPENDENT_AMBULATORY_CARE_PROVIDER_SITE_OTHER): Payer: Medicare Other | Admitting: Infectious Disease

## 2019-08-17 ENCOUNTER — Other Ambulatory Visit: Payer: Self-pay

## 2019-08-17 ENCOUNTER — Other Ambulatory Visit (HOSPITAL_COMMUNITY)
Admission: RE | Admit: 2019-08-17 | Discharge: 2019-08-17 | Disposition: A | Discharge status: Home / Self Care | Payer: Medicare Other | Source: Ambulatory Visit | Attending: Infectious Disease | Admitting: Infectious Disease

## 2019-08-17 ENCOUNTER — Encounter: Payer: Self-pay | Admitting: Infectious Disease

## 2019-08-17 VITALS — BP 118/76 | HR 61 | Temp 97.6°F | Wt 210.0 lb

## 2019-08-17 DIAGNOSIS — E119 Type 2 diabetes mellitus without complications: Secondary | ICD-10-CM

## 2019-08-17 DIAGNOSIS — F341 Dysthymic disorder: Secondary | ICD-10-CM | POA: Diagnosis not present

## 2019-08-17 DIAGNOSIS — A5271 Late syphilitic oculopathy: Secondary | ICD-10-CM

## 2019-08-17 DIAGNOSIS — B20 Human immunodeficiency virus [HIV] disease: Secondary | ICD-10-CM

## 2019-08-17 DIAGNOSIS — I2510 Atherosclerotic heart disease of native coronary artery without angina pectoris: Secondary | ICD-10-CM

## 2019-08-17 DIAGNOSIS — E038 Other specified hypothyroidism: Secondary | ICD-10-CM

## 2019-08-17 MED ORDER — BIKTARVY 50-200-25 MG PO TABS: 1.0000 | ORAL_TABLET | Freq: Every day | ORAL | 11 refills | Status: DC

## 2019-08-17 NOTE — Addendum Note (Signed)
Addended by: Mariea Clonts D on: 08/17/2019 09:49 AM   Modules accepted: Orders

## 2019-08-17 NOTE — Progress Notes (Signed)
Complaint follow-up for HIV on medications Subjective:    Patient ID: Jonathan Estes, male    DOB: [birth year], 64 y.o.   MRN: [id number]  HPI  Jonathan Estes is a 64 year old Caucasian man living with HIV also with comorbid diabetes mellitus coronary artery disease hyperlipidemia and hypothyroidism.  He has been perfectly controlled while taking Biktarvy with virological suppression and healthy immune status.  He has PCP locally though the MD is left he is now being followed by PA.  He has been fully vaccinated against COVID-19 there is frustrated that only 20% of the people living in his [place] have been vaccinated.    Past Medical History:  Diagnosis Date  . AIDS ([place]) 07/12/2014  . AKI (acute kidney injury) ([place]) 08/12/2017  . Anxiety   . Arthritis    "hips, shoulders" (03/21/2017)  . Bilateral bunions    left foot worse  . Coronary artery disease involving native coronary artery 01/12/2015  . Diabetes mellitus type 2, controlled, without complications ([place]) 52/09/7822  . Diabetes type 2, controlled ([place]) 07/12/2014  . High triglycerides   . HTN (hypertension)   . Human immunodeficiency virus (HIV) disease ([place]) d'x 09/2007  . Hyperlipidemia   . Hypothyroid   . 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 BRONCHOSCOPY  09/29/2007   Bronchoalveolar lavage was  obtained from the left lower lobe.  Under fluoroscopy, transbronchial Archie Endo 07/04/2010  . CONDYLOMA EXCISION/FULGURATION  ~ 1996   anal/notes 07/04/2010    Family History  Problem Relation Age of Onset  . Colon cancer Father   . Leukemia Maternal Grandfather       Social History   Socioeconomic History  . Marital status: Single    Spouse name: Not on file  . Number of children: Not on file  . Years of education: Not on file  . Highest education level: Not on file  Occupational History  . Not on file  Tobacco Use  . Smoking status:  Never Smoker  . Smokeless tobacco: Never Used  Vaping Use  . Vaping Use: Never used  Substance and Sexual Activity  . Alcohol use: Yes    Comment: 03/21/2017 "none since 01/12/2017"  . Drug use: Yes    Types: Marijuana, Cocaine    Comment: 03/21/2017 "no cocaine for a few years; I smoke marijuana qd"  . Sexual activity: Never    Partners: Female  Other Topics Concern  . Not on file  Social History Narrative   Lives alone.         Social Determinants of Health   Financial Resource Strain:   . Difficulty of Paying Living Expenses:   Food Insecurity:   . Worried About Charity fundraiser in the Last Year:   . Arboriculturist in the Last Year:   Transportation Needs:   . Film/video editor (Medical):   Marland Kitchen Lack of Transportation (Non-Medical):   Physical Activity:   . Days of Exercise per Week:   . Minutes of Exercise per Session:   Stress:   . Feeling of Stress :   Social Connections:   . Frequency of Communication with Friends and Family:   . Frequency of Social Gatherings with Friends and Family:   . Attends Religious Services:   . Active Member of Clubs or Organizations:   . Attends Archivist Meetings:   Marland Kitchen Marital Status:     Allergies  Allergen Reactions  . Sulfamethoxazole-Trimethoprim     REACTION: severe rash     Current Outpatient Medications:  .  ALPRAZolam (XANAX) 0.5 MG tablet, Take 1 tablet (0.5 mg total) by mouth 3 (three) times daily., Disp: 90 tablet, Rfl: 0 .  bictegravir-emtricitabine-tenofovir AF (BIKTARVY) 50-200-25 MG TABS tablet, Take 1 tablet by mouth daily., Disp: 30 tablet, Rfl: 11 .  cetirizine (ZYRTEC) 10 MG tablet, TAKE 1 TABLET (10 MG TOTAL) BY MOUTH DAILY., Disp: 90 tablet, Rfl: 2 .  [facility] LANCETS ORIGINAL MISC, 1 application by Does not apply route daily., Disp: 90 each, Rfl: 4 .  gemfibrozil (LOPID) 600 MG tablet, Take 1 tablet (600 mg total) by mouth 2 (two) times daily., Disp: 180 tablet, Rfl: 0 .  glucose blood (ACCU-CHEK  AVIVA) test strip, Use as instructed, Disp: 100 each, Rfl: 12 .  hydrochlorothiazide (MICROZIDE) 12.5 MG capsule, TAKE 1 CAPSULE BY MOUTH EVERY DAY, Disp: 90 capsule, Rfl: 0 .  levothyroxine (SYNTHROID) 25 MCG tablet, TAKE 1 TABLET BY MOUTH EVERY DAY ON AN EMPTY STOMACH, Disp: 90 tablet, Rfl: 0 .  lisinopril (ZESTRIL) 40 MG tablet, Take 1 tablet (40 mg total) by mouth daily., Disp: 90 tablet, Rfl: 0 .  metFORMIN (GLUCOPHAGE) 500 MG tablet, Take 0.5 tablets (250 mg total) by mouth 2 (two) times daily with a meal., Disp: 90 tablet, Rfl: 0 .  Misc Natural Products (OSTEO BI-FLEX JOINT SHIELD PO), Take 1 tablet by mouth daily., Disp: , Rfl:  .  Multiple Vitamins-Minerals (ICAPS AREDS 2 PO), Take by mouth., Disp: , Rfl:  .  pravastatin (PRAVACHOL) 20 MG tablet, TAKE 1 TABLET BY MOUTH EVERY DAY AT BEDTIME, Disp: 90 tablet, Rfl: 0 .  Vitamin D, Ergocalciferol, (DRISDOL) 1.25 MG (50000 UT) CAPS capsule, Take one tablet wkly, Disp: 12 capsule, Rfl: 3 .  zinc gluconate 50 MG tablet, Take 50 mg by mouth daily. (Patient not taking: Reported on 08/17/2019), Disp: , Rfl:   Review of Systems  Constitutional: Negative for activity change, appetite change, chills, diaphoresis, fatigue, fever and unexpected weight change.  HENT: Negative for congestion, rhinorrhea, sinus pressure, sneezing, sore throat and trouble swallowing.   Eyes: Negative for photophobia and visual disturbance.  Respiratory: Negative for cough, chest tightness, shortness of breath, wheezing and stridor.   Cardiovascular: Negative for chest pain, palpitations and leg swelling.  Gastrointestinal: Negative for abdominal distention, abdominal pain, anal bleeding, blood in stool, constipation, diarrhea, nausea and vomiting.  Genitourinary: Negative for difficulty urinating, dysuria, flank pain and hematuria.  Musculoskeletal: Negative for arthralgias, back pain, gait problem, joint swelling and myalgias.  Skin: Negative for color change, pallor,  rash and wound.  Neurological: Negative for dizziness, tremors, weakness and light-headedness.  Hematological: Negative for adenopathy. Does not bruise/bleed easily.  Psychiatric/Behavioral: Negative for agitation, behavioral problems, confusion, decreased concentration, dysphoric mood and sleep disturbance.       Objective:   Physical Exam Constitutional:      General: He is not in acute distress.    Appearance: Normal appearance. He is well-developed. He is not ill-appearing or diaphoretic.  HENT:     Head: Normocephalic and atraumatic.     Right Ear: Hearing and external ear normal.     Left Ear: Hearing and external ear normal.     Nose: No nasal deformity or rhinorrhea.  Eyes:     General: No scleral icterus.    Conjunctiva/sclera: Conjunctivae normal.     Right eye: Right conjunctiva is not injected.  Left eye: Left conjunctiva is not injected.  Neck:     Vascular: No JVD.  Cardiovascular:     Rate and Rhythm: Normal rate and regular rhythm.     Heart sounds: S1 normal and S2 normal. No murmur heard.   Pulmonary:     Effort: No respiratory distress.     Breath sounds: No wheezing.  Abdominal:     General: There is no distension.     Palpations: Abdomen is soft.  Musculoskeletal:        General: Normal range of motion.     Right shoulder: Normal.     Left shoulder: Normal.     Cervical back: Normal range of motion and neck supple.     Right hip: Normal.     Left hip: Normal.     Right knee: Normal.     Left knee: Normal.  Lymphadenopathy:     Head:     Right side of head: No submandibular, preauricular or posterior auricular adenopathy.     Left side of head: No submandibular, preauricular or posterior auricular adenopathy.     Cervical: No cervical adenopathy.     Right cervical: No superficial or deep cervical adenopathy.    Left cervical: No superficial or deep cervical adenopathy.  Skin:    General: Skin is warm and dry.     Coloration: Skin is not  pale.     Findings: No abrasion, bruising, ecchymosis, erythema, lesion or rash.     Nails: There is no clubbing.  Neurological:     General: No focal deficit present.     Mental Status: He is alert and oriented to person, place, and time.     Sensory: No sensory deficit.     Coordination: Coordination normal.     Gait: Gait normal.  Psychiatric:        Attention and Perception: He is attentive.        Mood and Affect: Mood normal.        Speech: Speech normal.        Behavior: Behavior normal. Behavior is cooperative.        Thought Content: Thought content normal.        Judgment: Judgment normal.           Assessment & Plan:  HIV disease continue Biktarvy check labs today  Syphilis: Follow titers.  Hypothyroidism TSH followed by PCP  Diabetes mellitus and coronary disease followed by PCP

## 2019-08-18 LAB — T-HELPER CELL (CD4) - (RCID CLINIC ONLY)
CD4 % Helper T Cell: 29 % — ABNORMAL LOW (ref 33–65)
CD4 T Cell Abs: 705 /uL (ref 400–1790)

## 2019-08-18 LAB — URINE CYTOLOGY ANCILLARY ONLY
Chlamydia: NEGATIVE
Comment: NEGATIVE
Comment: NORMAL
Neisseria Gonorrhea: NEGATIVE

## 2019-08-19 LAB — CBC WITH DIFFERENTIAL/PLATELET
Absolute Monocytes: 737 cells/uL (ref 200–950)
Basophils Absolute: 81 cells/uL (ref 0–200)
Basophils Relative: 0.8 %
Eosinophils Absolute: 333 cells/uL (ref 15–500)
Eosinophils Relative: 3.3 %
HCT: 42.2 % (ref 38.5–50.0)
Hemoglobin: 14.4 g/dL (ref 13.2–17.1)
Lymphs Abs: 2586 cells/uL (ref 850–3900)
MCH: 32.8 pg (ref 27.0–33.0)
MCHC: 34.1 g/dL (ref 32.0–36.0)
MCV: 96.1 fL (ref 80.0–100.0)
MPV: 9.9 fL (ref 7.5–12.5)
Monocytes Relative: 7.3 %
Neutro Abs: 6363 cells/uL (ref 1500–7800)
Neutrophils Relative %: 63 %
Platelets: 265 10*3/uL (ref 140–400)
RBC: 4.39 10*6/uL (ref 4.20–5.80)
RDW: 11.9 % (ref 11.0–15.0)
Total Lymphocyte: 25.6 %
WBC: 10.1 10*3/uL (ref 3.8–10.8)

## 2019-08-19 LAB — COMPLETE METABOLIC PANEL WITH GFR
AG Ratio: 1.6 (calc) (ref 1.0–2.5)
ALT: 8 U/L — ABNORMAL LOW (ref 9–46)
AST: 13 U/L (ref 10–35)
Albumin: 4.5 g/dL (ref 3.6–5.1)
Alkaline phosphatase (APISO): 81 U/L (ref 35–144)
BUN: 21 mg/dL (ref 7–25)
CO2: 31 mmol/L (ref 20–32)
Calcium: 9.6 mg/dL (ref 8.6–10.3)
Chloride: 100 mmol/L (ref 98–110)
Creat: 1.2 mg/dL (ref 0.70–1.25)
GFR, Est African American: 75 mL/min/{1.73_m2} (ref >=60)
GFR, Est Non African American: 65 mL/min/{1.73_m2} (ref >=60)
Globulin: 2.8 g/dL (calc) (ref 1.9–3.7)
Glucose, Bld: 115 mg/dL — ABNORMAL HIGH (ref 65–99)
Potassium: 4.6 mmol/L (ref 3.5–5.3)
Sodium: 138 mmol/L (ref 135–146)
Total Bilirubin: 0.5 mg/dL (ref 0.2–1.2)
Total Protein: 7.3 g/dL (ref 6.1–8.1)

## 2019-08-19 LAB — RPR TITER: RPR Titer: 1:8 {titer} — ABNORMAL HIGH

## 2019-08-19 LAB — FLUORESCENT TREPONEMAL AB(FTA)-IGG-BLD: Fluorescent Treponemal ABS: REACTIVE — AB

## 2019-08-19 LAB — RPR: RPR Ser Ql: REACTIVE — AB

## 2019-08-19 LAB — HIV-1 RNA QUANT-NO REFLEX-BLD
HIV 1 RNA Quant: 109 copies/mL — ABNORMAL HIGH
HIV-1 RNA Quant, Log: 2.04 Log copies/mL — ABNORMAL HIGH

## 2019-08-26 ENCOUNTER — Other Ambulatory Visit: Payer: Self-pay | Admitting: Physician Assistant

## 2019-08-26 DIAGNOSIS — E038 Other specified hypothyroidism: Secondary | ICD-10-CM

## 2019-08-26 DIAGNOSIS — E119 Type 2 diabetes mellitus without complications: Secondary | ICD-10-CM

## 2019-08-26 DIAGNOSIS — E1159 Type 2 diabetes mellitus with other circulatory complications: Secondary | ICD-10-CM

## 2019-08-26 DIAGNOSIS — E1169 Type 2 diabetes mellitus with other specified complication: Secondary | ICD-10-CM

## 2019-08-26 MED ORDER — METFORMIN HCL 500 MG PO TABS: 250.0000 mg | ORAL_TABLET | Freq: Two times a day (BID) | ORAL | 0 refills | Status: DC

## 2019-08-26 MED ORDER — PRAVASTATIN SODIUM 20 MG PO TABS: ORAL_TABLET | ORAL | 0 refills | Status: DC

## 2019-08-26 MED ORDER — LISINOPRIL 40 MG PO TABS: 40.0000 mg | ORAL_TABLET | Freq: Every day | ORAL | 0 refills | Status: DC

## 2019-08-26 MED ORDER — HYDROCHLOROTHIAZIDE 12.5 MG PO CAPS: ORAL_CAPSULE | ORAL | 0 refills | Status: DC

## 2019-08-26 MED ORDER — LEVOTHYROXINE SODIUM 25 MCG PO TABS: ORAL_TABLET | ORAL | 0 refills | Status: DC

## 2019-08-26 NOTE — Telephone Encounter (Signed)
Medication refill sent to requested pharmacy. AS, CMA 

## 2019-08-26 NOTE — Telephone Encounter (Signed)
Patient called says pharmacy sent several request to provider for refill on :   1)--- hydrochlorothiazide (MICROZIDE) 12.5 MG capsule [[id number]]   Order Details Dose, Route, Frequency: As Directed  Dispense Quantity: 90 capsule Refills: 0       Sig: TAKE 1 CAPSULE BY MOUTH EVERY DAY    2)-- levothyroxine (SYNTHROID) 25 MCG tablet [[id number]]   Order Details Dose, Route, Frequency: As Directed  Dispense Quantity: 90 tablet Refills: 0       Sig: TAKE 1 TABLET BY MOUTH EVERY DAY ON AN EMPTY STOMACH    3)--- lisinopril (ZESTRIL) 40 MG tablet [[id number]]   Order Details Dose: 40 mg Route: Oral Frequency: Daily  Dispense Quantity: 90 tablet Refills: 0   Note to Pharmacy: DX Code Needed .      Sig: Take 1 tablet (40 mg total) by mouth daily.    4)--- metFORMIN (GLUCOPHAGE) 500 MG tablet [[id number]]   Order Details Dose: 250 mg Route: Oral Frequency: 2 times daily with meals  Dispense Quantity: 90 tablet Refills: 0       Sig: Take 0.5 tablets (250 mg total) by mouth 2 (two) times daily with a meal.   5)---- pravastatin (PRAVACHOL) 20 MG tablet [[id number]]   Order Details Dose, Route, Frequency: As Directed  Dispense Quantity: 90 tablet Refills: 0       Sig: TAKE 1 TABLET BY MOUTH EVERY DAY AT BEDTIME       ---Forwarding request to med asst that if approved send refill order to :   Pharmacy  [id number] [place], [place] - [address] AT [facility]  [address] [place] [address]  Phone:  [phone] Fax:  [phone]     --Fausto Skillern

## 2019-09-09 DIAGNOSIS — H40013 Open angle with borderline findings, low risk, bilateral: Secondary | ICD-10-CM | POA: Diagnosis not present

## 2019-09-09 DIAGNOSIS — H35033 Hypertensive retinopathy, bilateral: Secondary | ICD-10-CM | POA: Diagnosis not present

## 2019-09-09 DIAGNOSIS — H47293 Other optic atrophy, bilateral: Secondary | ICD-10-CM | POA: Diagnosis not present

## 2019-09-09 DIAGNOSIS — H2513 Age-related nuclear cataract, bilateral: Secondary | ICD-10-CM | POA: Diagnosis not present

## 2019-09-09 DIAGNOSIS — H0288A Meibomian gland dysfunction right eye, upper and lower eyelids: Secondary | ICD-10-CM | POA: Diagnosis not present

## 2019-09-10 ENCOUNTER — Other Ambulatory Visit: Payer: Self-pay | Admitting: Physician Assistant

## 2019-09-10 DIAGNOSIS — E1169 Type 2 diabetes mellitus with other specified complication: Secondary | ICD-10-CM

## 2019-09-10 DIAGNOSIS — E119 Type 2 diabetes mellitus without complications: Secondary | ICD-10-CM

## 2019-09-22 ENCOUNTER — Other Ambulatory Visit: Payer: Self-pay

## 2019-09-22 ENCOUNTER — Encounter: Payer: Self-pay | Admitting: Physician Assistant

## 2019-09-22 ENCOUNTER — Ambulatory Visit (INDEPENDENT_AMBULATORY_CARE_PROVIDER_SITE_OTHER): Payer: Medicare Other | Admitting: Physician Assistant

## 2019-09-22 ENCOUNTER — Other Ambulatory Visit: Payer: Self-pay | Admitting: Family

## 2019-09-22 VITALS — BP 129/74 | HR 69 | Temp 97.5°F | Ht 68.0 in | Wt 212.7 lb

## 2019-09-22 DIAGNOSIS — E119 Type 2 diabetes mellitus without complications: Secondary | ICD-10-CM

## 2019-09-22 DIAGNOSIS — E1169 Type 2 diabetes mellitus with other specified complication: Secondary | ICD-10-CM | POA: Diagnosis not present

## 2019-09-22 DIAGNOSIS — E1159 Type 2 diabetes mellitus with other circulatory complications: Secondary | ICD-10-CM | POA: Diagnosis not present

## 2019-09-22 DIAGNOSIS — E782 Mixed hyperlipidemia: Secondary | ICD-10-CM

## 2019-09-22 DIAGNOSIS — I152 Hypertension secondary to endocrine disorders: Secondary | ICD-10-CM

## 2019-09-22 DIAGNOSIS — I10 Essential (primary) hypertension: Secondary | ICD-10-CM

## 2019-09-22 DIAGNOSIS — F419 Anxiety disorder, unspecified: Secondary | ICD-10-CM

## 2019-09-22 LAB — POCT GLYCOSYLATED HEMOGLOBIN (HGB A1C): Hemoglobin A1C: 6.4 % — AB (ref 4.0–5.6)

## 2019-09-22 NOTE — Patient Instructions (Signed)

## 2019-09-22 NOTE — Progress Notes (Signed)
Established Patient Office Visit  Subjective:  Patient ID: Jonathan Estes, male    DOB: [birth year]  Age: 64 y.o. MRN: [id number]  CC:  Chief Complaint  Patient presents with  . Diabetes  . Hyperlipidemia  . Hypertension    HPI Jonathan Estes presents for follow-up on diabetes mellitus, hyperlipidemia, and hypertension.  Diabetes: Pt denies increased urination or thirst. Pt reports medication compliance. Denies hypoglycemic events. Doesn't check glucose at home. Reports his diabetes has been under control for many years.  He does report increasing sweet consumption.  HTN: Pt denies chest pain, palpitations, dizziness or leg swelling. Taking medication as directed without side effects. Checks BP at home and readings range in 120s/70s. Pt states he has increased salt consumption recently.  HLD: Pt tolerating medication without issues. Does reports missing some doses of Pravastatin. Denies side effects including myalgias and RUQ pain. Stays active with house work.   Past Medical History:  Diagnosis Date  . AIDS ([facility]) 07/12/2014  . AKI (acute kidney injury) ([facility]) 08/12/2017  . Anxiety   . Arthritis    "hips, shoulders" (03/21/2017)  . Bilateral bunions    left foot worse  . Coronary artery disease involving native coronary artery 01/12/2015  . Diabetes mellitus type 2, controlled, without complications ([facility]) 01/12/2015  . Diabetes type 2, controlled ([facility]) 07/12/2014  . High triglycerides   . HTN (hypertension)   . Human immunodeficiency virus (HIV) disease ([facility]) d'x 09/2007  . Hyperlipidemia   . Hypothyroid   . 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 BRONCHOSCOPY  09/29/2007   Bronchoalveolar lavage was  obtained from the left lower lobe.  Under fluoroscopy, transbronchial Hattie Perch 07/04/2010  . CONDYLOMA EXCISION/FULGURATION  ~ 1996   anal/notes 07/04/2010    Family History  Problem  Relation Age of Onset  . Colon cancer Father   . Leukemia Maternal Grandfather     Social History   Socioeconomic History  . Marital status: Single    Spouse name: Not on file  . Number of children: Not on file  . Years of education: Not on file  . Highest education level: Not on file  Occupational History  . Not on file  Tobacco Use  . Smoking status: Never Smoker  . Smokeless tobacco: Never Used  Vaping Use  . Vaping Use: Never used  Substance and Sexual Activity  . Alcohol use: Yes    Comment: 03/21/2017 "none since 01/12/2017"  . Drug use: Yes    Types: Marijuana, Cocaine    Comment: 03/21/2017 "no cocaine for a few years; I smoke marijuana qd"  . Sexual activity: Never    Partners: Female  Other Topics Concern  . Not on file  Social History Narrative   Lives alone.         Social Determinants of Health   Financial Resource Strain:   . Difficulty of Paying Living Expenses:   Food Insecurity:   . Worried About Programme researcher, broadcasting/film/video in the Last Year:   . Barista in the Last Year:   Transportation Needs:   . Freight forwarder (Medical):   Marland Kitchen Lack of Transportation (Non-Medical):   Physical Activity:   . Days of Exercise per Week:   . Minutes of Exercise per Session:   Stress:   . Feeling of Stress :   Social Connections:   . Frequency of Communication with  Friends and Family:   . Frequency of Social Gatherings with Friends and Family:   . Attends Religious Services:   . Active Member of Clubs or Organizations:   . Attends Banker Meetings:   Marland Kitchen Marital Status:   Intimate Partner Violence:   . Fear of Current or Ex-Partner:   . Emotionally Abused:   Marland Kitchen Physically Abused:   . Sexually Abused:     Outpatient Medications Prior to Visit  Medication Sig Dispense Refill  . bictegravir-emtricitabine-tenofovir AF (BIKTARVY) 50-200-25 MG TABS tablet Take 1 tablet by mouth daily. 30 tablet 11  . cetirizine (ZYRTEC) 10 MG tablet TAKE 1 TABLET  (10 MG TOTAL) BY MOUTH DAILY. 90 tablet 2  . [facility]S LANCETS ORIGINAL MISC 1 application by Does not apply route daily. 90 each 4  . gemfibrozil (LOPID) 600 MG tablet Take 1 tablet (600 mg total) by mouth 2 (two) times daily. 180 tablet 0  . glucose blood (ACCU-CHEK AVIVA) test strip Use as instructed 100 each 12  . hydrochlorothiazide (MICROZIDE) 12.5 MG capsule TAKE 1 CAPSULE BY MOUTH EVERY DAY 90 capsule 0  . levothyroxine (SYNTHROID) 25 MCG tablet TAKE 1 TABLET BY MOUTH EVERY DAY ON AN EMPTY STOMACH 90 tablet 0  . lisinopril (ZESTRIL) 40 MG tablet Take 1 tablet (40 mg total) by mouth daily. 90 tablet 0  . Misc Natural Products (OSTEO BI-FLEX JOINT SHIELD PO) Take 1 tablet by mouth daily.    . Multiple Vitamins-Minerals (ICAPS AREDS 2 PO) Take by mouth.    . zinc gluconate 50 MG tablet Take 50 mg by mouth daily.     Marland Kitchen ALPRAZolam (XANAX) 0.5 MG tablet Take 1 tablet (0.5 mg total) by mouth 3 (three) times daily. 90 tablet 0  . metFORMIN (GLUCOPHAGE) 500 MG tablet Take 0.5 tablets (250 mg total) by mouth 2 (two) times daily with a meal. 90 tablet 0  . pravastatin (PRAVACHOL) 20 MG tablet TAKE 1 TABLET BY MOUTH EVERY DAY AT BEDTIME 90 tablet 0  . Vitamin D, Ergocalciferol, (DRISDOL) 1.25 MG (50000 UT) CAPS capsule Take one tablet wkly 12 capsule 3   No facility-administered medications prior to visit.    Allergies  Allergen Reactions  . Sulfamethoxazole-Trimethoprim     REACTION: severe rash    ROS Review of Systems  A fourteen system review of systems was performed and found to be positive as per HPI.  Objective:    Physical Exam General:  Well Developed, appropriate for stated age.  Neuro:  Alert and oriented,  extra-ocular muscles intact  HEENT:  Normocephalic, atraumatic, neck supple Skin:  no gross rash, warm, pink. Cardiac:  RRR, S1 S2 Respiratory:  ECTA B/L, Not using accessory muscles, speaking in full sentences- unlabored. Vascular:  Ext warm, no cyanosis apprec.; cap RF  less 2 sec. No gross edema Psych:  No HI/SI, judgement and insight good, Euthymic mood. Full Affect.  BP 129/74   Pulse 69   Temp (!) 97.5 F (36.4 C) (Oral)   Ht 5\' 8"  (1.727 m)   Wt 212 lb 11.2 oz (96.5 kg)   SpO2 97% Comment: on RA  BMI 32.34 kg/m  Wt Readings from Last 3 Encounters:  09/22/19 212 lb 11.2 oz (96.5 kg)  08/17/19 210 lb (95.3 kg)  05/26/19 200 lb (90.7 kg)     Health Maintenance Due  Topic Date Due  . COLONOSCOPY  05/22/2017  . INFLUENZA VACCINE  10/04/2019    There are no preventive care reminders to  display for this patient.  Lab Results  Component Value Date   TSH 3.380 06/29/2019   Lab Results  Component Value Date   WBC 10.1 08/17/2019   HGB 14.4 08/17/2019   HCT 42.2 08/17/2019   MCV 96.1 08/17/2019   PLT 265 08/17/2019   Lab Results  Component Value Date   NA 138 08/17/2019   K 4.6 08/17/2019   CO2 31 08/17/2019   GLUCOSE 115 (H) 08/17/2019   BUN 21 08/17/2019   CREATININE 1.20 08/17/2019   BILITOT 0.5 08/17/2019   ALKPHOS 102 07/14/2018   AST 13 08/17/2019   ALT 8 (L) 08/17/2019   PROT 7.3 08/17/2019   ALBUMIN 4.6 07/14/2018   CALCIUM 9.6 08/17/2019   ANIONGAP 13 03/21/2017   Lab Results  Component Value Date   CHOL 136 01/26/2019   Lab Results  Component Value Date   HDL 49 01/26/2019   Lab Results  Component Value Date   LDLCALC 63 01/26/2019   Lab Results  Component Value Date   TRIG 159 (H) 01/26/2019   Lab Results  Component Value Date   CHOLHDL 2.8 01/26/2019   Lab Results  Component Value Date   HGBA1C 6.4 (A) 09/22/2019      Assessment & Plan:   Problem List Items Addressed This Visit      Cardiovascular and Mediastinum   Hypertension associated with diabetes (HCC) (Chronic)     Endocrine   Controlled type 2 diabetes mellitus without complication, without long-term current use of insulin (HCC) - Primary (Chronic)   Relevant Orders   POCT glycosylated hemoglobin (Hb A1C) (Completed)   Mixed  diabetic hyperlipidemia associated with type 2 diabetes mellitus (HCC) (Chronic)     Controlled T2DM w/o complication, w/o long-term current use of insulin: -A1c today is 6.4, stable -Continue current medication regimen. -Recommend to reduce glucose and monitor carbohydrates. -Stay as active as possible.  Hypertension associated with diabetes: -BP at goal. -Continue current medication regimen. -Stay well hydrated.  Mixed diabetic hyperlipidemia associated with T2DM: -Last lipid panel: triglycerides elevated -Continue current medication regimen. -Follow heart healthy diet. -Plan to recheck lipid panel next OV, pending results will discuss medication adjustments.  No orders of the defined types were placed in this encounter.   Follow-up: Return in about 4 months (around 01/23/2020) for DM, HTN, HLD, hypothyroid and FBW (lipid, cmp, a1c, tsh).    Mayer Masker, PA-C

## 2019-09-25 ENCOUNTER — Other Ambulatory Visit: Payer: Self-pay | Admitting: Infectious Disease

## 2019-09-25 ENCOUNTER — Telehealth: Payer: Self-pay

## 2019-09-25 DIAGNOSIS — F419 Anxiety disorder, unspecified: Secondary | ICD-10-CM

## 2019-09-25 MED ORDER — ALPRAZOLAM 0.5 MG PO TABS: 0.5000 mg | ORAL_TABLET | Freq: Three times a day (TID) | ORAL | 5 refills | Status: DC

## 2019-09-25 NOTE — Telephone Encounter (Signed)
Rx sent 

## 2019-09-25 NOTE — Telephone Encounter (Signed)
Patient called office today requesting refills on Xanax 0.5 mg tablet. Will forward message to provider to advise on refill. [facility] [place] [place], [place]

## 2019-10-08 ENCOUNTER — Other Ambulatory Visit: Payer: Self-pay | Admitting: Physician Assistant

## 2019-10-08 DIAGNOSIS — E1169 Type 2 diabetes mellitus with other specified complication: Secondary | ICD-10-CM

## 2019-10-08 DIAGNOSIS — E119 Type 2 diabetes mellitus without complications: Secondary | ICD-10-CM

## 2019-10-28 ENCOUNTER — Other Ambulatory Visit: Payer: Self-pay | Admitting: Physician Assistant

## 2019-10-28 DIAGNOSIS — E781 Pure hyperglyceridemia: Secondary | ICD-10-CM

## 2019-10-28 DIAGNOSIS — E1169 Type 2 diabetes mellitus with other specified complication: Secondary | ICD-10-CM

## 2019-11-11 ENCOUNTER — Ambulatory Visit (INDEPENDENT_AMBULATORY_CARE_PROVIDER_SITE_OTHER): Payer: Self-pay | Admitting: *Deleted

## 2019-11-11 ENCOUNTER — Other Ambulatory Visit: Payer: Self-pay

## 2019-11-11 VITALS — BP 128/80 | HR 57 | Temp 97.8°F | Wt 209.0 lb

## 2019-11-11 DIAGNOSIS — Z006 Encounter for examination for normal comparison and control in clinical research program: Secondary | ICD-10-CM

## 2019-11-11 NOTE — Progress Notes (Signed)
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 He now only has visits once a year for this study. He denies any new problems or concerns. He has already gotten his booster for covid vaccine 3 weeks ago. He will continue followup with Dr. Daiva Eves in the clinic.

## 2019-11-12 LAB — CBC AND DIFFERENTIAL
HCT: 41 (ref 41–53)
Hemoglobin: 13.9 (ref 13.5–17.5)
Neutrophils Absolute: 5.3
Platelets: 237 (ref 150–399)
WBC: 9.4

## 2019-11-12 LAB — HEPATITIS C ANTIBODY
Hepatitis C Ab: NONREACTIVE
SIGNAL TO CUT-OFF: 0.04 (ref <1.00)

## 2019-11-12 LAB — CREATININE, SERUM: Creat: 1.21 mg/dL (ref 0.70–1.25)

## 2019-11-12 LAB — CBC: RBC: 4.29 (ref 3.87–5.11)

## 2019-11-22 ENCOUNTER — Other Ambulatory Visit: Payer: Self-pay | Admitting: Physician Assistant

## 2019-11-22 DIAGNOSIS — I152 Hypertension secondary to endocrine disorders: Secondary | ICD-10-CM

## 2019-12-03 LAB — CD4/CD8 (T-HELPER/T-SUPPRESSOR CELL)
CD4 % Helper T Cell: 33.6
CD4 Count: 941
CD8 % Suppressor T Cell: 47.2
CD8 T Cell Abs: 1322
HIV 1 RNA UltraQuant: 47
HIV 1 RNA UltraQuant: 47
HIV 1 RNA UltraQuant: 47

## 2019-12-13 ENCOUNTER — Other Ambulatory Visit: Payer: Self-pay | Admitting: Physician Assistant

## 2019-12-13 DIAGNOSIS — E1159 Type 2 diabetes mellitus with other circulatory complications: Secondary | ICD-10-CM

## 2019-12-23 ENCOUNTER — Encounter: Payer: Self-pay | Admitting: Infectious Disease

## 2020-01-20 ENCOUNTER — Other Ambulatory Visit: Payer: Self-pay | Admitting: Physician Assistant

## 2020-01-20 DIAGNOSIS — E1169 Type 2 diabetes mellitus with other specified complication: Secondary | ICD-10-CM

## 2020-01-20 DIAGNOSIS — E781 Pure hyperglyceridemia: Secondary | ICD-10-CM

## 2020-01-20 DIAGNOSIS — E782 Mixed hyperlipidemia: Secondary | ICD-10-CM

## 2020-01-22 ENCOUNTER — Other Ambulatory Visit: Payer: Self-pay

## 2020-01-22 ENCOUNTER — Encounter (INDEPENDENT_AMBULATORY_CARE_PROVIDER_SITE_OTHER): Payer: Self-pay | Admitting: *Deleted

## 2020-01-22 VITALS — BP 146/88 | HR 67 | Temp 98.0°F | Wt 209.2 lb

## 2020-01-22 DIAGNOSIS — Z006 Encounter for examination for normal comparison and control in clinical research program: Secondary | ICD-10-CM

## 2020-01-22 NOTE — Research (Signed)
Jonathan Estes was here for his final visit for The [facility] Study: A Long Term follow-up of Older HIV-Infected Adults in the ACTG, an observational study addressing the issues of aging, HIV infection and Inflammation.  He denies any new problems or medications.  He will continue to followed through yearly on the [facility] study.

## 2020-01-23 LAB — PROTEIN / CREATININE RATIO, URINE
Creatinine, Urine: 18 mg/dL — ABNORMAL LOW (ref 20–320)
Total Protein, Urine: <4 mg/dL — ABNORMAL LOW (ref 5–25)

## 2020-01-25 ENCOUNTER — Encounter: Payer: Self-pay | Admitting: Physician Assistant

## 2020-01-25 ENCOUNTER — Other Ambulatory Visit: Payer: Self-pay

## 2020-01-25 ENCOUNTER — Other Ambulatory Visit: Payer: Self-pay | Admitting: Physician Assistant

## 2020-01-25 ENCOUNTER — Ambulatory Visit (INDEPENDENT_AMBULATORY_CARE_PROVIDER_SITE_OTHER): Payer: Medicare Other | Admitting: Physician Assistant

## 2020-01-25 VITALS — BP 120/71 | HR 73 | Ht 68.0 in | Wt 208.1 lb

## 2020-01-25 DIAGNOSIS — E039 Hypothyroidism, unspecified: Secondary | ICD-10-CM

## 2020-01-25 DIAGNOSIS — E119 Type 2 diabetes mellitus without complications: Secondary | ICD-10-CM

## 2020-01-25 DIAGNOSIS — E782 Mixed hyperlipidemia: Secondary | ICD-10-CM

## 2020-01-25 DIAGNOSIS — E1169 Type 2 diabetes mellitus with other specified complication: Secondary | ICD-10-CM

## 2020-01-25 DIAGNOSIS — E1159 Type 2 diabetes mellitus with other circulatory complications: Secondary | ICD-10-CM

## 2020-01-25 DIAGNOSIS — E038 Other specified hypothyroidism: Secondary | ICD-10-CM

## 2020-01-25 DIAGNOSIS — I152 Hypertension secondary to endocrine disorders: Secondary | ICD-10-CM

## 2020-01-25 LAB — COMPREHENSIVE METABOLIC PANEL
AG Ratio: 1.6 (calc) (ref 1.0–2.5)
ALT: 8 U/L — ABNORMAL LOW (ref 9–46)
AST: 14 U/L (ref 10–35)
Albumin: 4.9 g/dL (ref 3.6–5.1)
Alkaline phosphatase (APISO): 110 U/L (ref 35–144)
BUN: 19 mg/dL (ref 7–25)
CO2: 26 mmol/L (ref 20–32)
Calcium: 9.9 mg/dL (ref 8.6–10.3)
Chloride: 101 mmol/L (ref 98–110)
Creat: 1.22 mg/dL (ref 0.70–1.25)
Globulin: 3.1 g/dL (calc) (ref 1.9–3.7)
Glucose, Bld: 119 mg/dL — ABNORMAL HIGH (ref 65–99)
Potassium: 4.2 mmol/L (ref 3.5–5.3)
Sodium: 139 mmol/L (ref 135–146)
Total Bilirubin: 0.3 mg/dL (ref 0.2–1.2)
Total Protein: 8 g/dL (ref 6.1–8.1)

## 2020-01-25 LAB — LIPID PANEL
Cholesterol: 154 mg/dL (ref <200)
HDL: 49 mg/dL (ref >=40)
LDL Cholesterol (Calc): 80 mg/dL (calc)
Non-HDL Cholesterol (Calc): 105 mg/dL (calc) (ref <130)
Total CHOL/HDL Ratio: 3.1 (calc) (ref <5.0)
Triglycerides: 156 mg/dL — ABNORMAL HIGH (ref <150)

## 2020-01-25 LAB — CBC WITH DIFFERENTIAL/PLATELET
Absolute Monocytes: 568 cells/uL (ref 200–950)
Basophils Absolute: 78 cells/uL (ref 0–200)
Basophils Relative: 1.1 %
Eosinophils Absolute: 369 cells/uL (ref 15–500)
Eosinophils Relative: 5.2 %
HCT: 42.4 % (ref 38.5–50.0)
Hemoglobin: 14.8 g/dL (ref 13.2–17.1)
Lymphs Abs: 2499 cells/uL (ref 850–3900)
MCH: 32.8 pg (ref 27.0–33.0)
MCHC: 34.9 g/dL (ref 32.0–36.0)
MCV: 94 fL (ref 80.0–100.0)
MPV: 10.1 fL (ref 7.5–12.5)
Monocytes Relative: 8 %
Neutro Abs: 3586 cells/uL (ref 1500–7800)
Neutrophils Relative %: 50.5 %
Platelets: 281 10*3/uL (ref 140–400)
RBC: 4.51 10*6/uL (ref 4.20–5.80)
RDW: 11.9 % (ref 11.0–15.0)
Total Lymphocyte: 35.2 %
WBC: 7.1 10*3/uL (ref 3.8–10.8)

## 2020-01-25 LAB — HEMOGLOBIN A1C
Hgb A1c MFr Bld: 6.5 % of total Hgb — ABNORMAL HIGH (ref <5.7)
Mean Plasma Glucose: 140 (calc)
eAG (mmol/L): 7.7 (calc)

## 2020-01-25 LAB — HEPATITIS C ANTIBODY
Hepatitis C Ab: NONREACTIVE
SIGNAL TO CUT-OFF: 0.03 (ref <1.00)

## 2020-01-25 NOTE — Assessment & Plan Note (Signed)
-  Last thyroid function tests wnl -Continue current medication regimen. -Will continue to monitor.

## 2020-01-25 NOTE — Assessment & Plan Note (Addendum)
-  Most recent A1c continues to be at goal. -Continue current medication regimen. -Monitor carbohydrates and reduce glucose. -Stay as active as possible. -Will continue to monitor.

## 2020-01-25 NOTE — Assessment & Plan Note (Signed)
-  BP at goal. -Continue current medication regimen. -Stay well hydrated and follow a low sodium diet. -Will continue to monitor.

## 2020-01-25 NOTE — Progress Notes (Signed)
Established Patient Office Visit  Subjective:  Patient ID: Jonathan Estes, male    DOB: 08/29/1955  Age: 64 y.o. MRN: [id number]  CC:  Chief Complaint  Patient presents with  . Diabetes  . Hypertension  . Hyperlipidemia  . Thyroid Problem    HPI Jonathan Estes presents for follow up on diabetes mellitus, hypertension, hyperlipidemia and hypothyroid. Has no acute concerns.   Diabetes mellitus: Pt denies increased urination or thirst. Pt reports medication compliance. No hypoglycemic events. Rarely checks glucose at home. Checked it last Friday and it was 128. States is eating more sweets.  HTN: Pt denies chest pain, palpitations, dizziness, headache or lower extremity swelling. Taking medication as directed without side effects. Checks BP at home and readings range 118/120s/60-70s. Pt reports good hydration.  HLD: Pt taking medication without issues but continues to miss some of his nighttime doses when he falls asleep watching T.V.  Hypothyroid: Reports has been on same dose of levothyroxine for many years. Asymptomatic.   Past Medical History:  Diagnosis Date  . AIDS (HCC) 07/12/2014  . AKI (acute kidney injury) (HCC) 08/12/2017  . Anxiety   . Arthritis    "hips, shoulders" (03/21/2017)  . Bilateral bunions    left foot worse  . Coronary artery disease involving native coronary artery 01/12/2015  . Diabetes mellitus type 2, controlled, without complications (HCC) 01/12/2015  . Diabetes type 2, controlled (HCC) 07/12/2014  . High triglycerides   . HTN (hypertension)   . Human immunodeficiency virus (HIV) disease (HCC) d'x 09/2007  . Hyperlipidemia   . Hypothyroid   . 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 BRONCHOSCOPY  09/29/2007   Bronchoalveolar lavage was  obtained from the left lower lobe.  Under fluoroscopy, transbronchial Hattie Perch 07/04/2010  . CONDYLOMA EXCISION/FULGURATION  ~  1996   anal/notes 07/04/2010    Family History  Problem Relation Age of Onset  . Colon cancer Father   . Leukemia Maternal Grandfather     Social History   Socioeconomic History  . Marital status: Single    Spouse name: Not on file  . Number of children: Not on file  . Years of education: Not on file  . Highest education level: Not on file  Occupational History  . Not on file  Tobacco Use  . Smoking status: Never Smoker  . Smokeless tobacco: Never Used  Vaping Use  . Vaping Use: Never used  Substance and Sexual Activity  . Alcohol use: Yes    Comment: 03/21/2017 "none since 01/12/2017"  . Drug use: Yes    Types: Marijuana, Cocaine    Comment: 03/21/2017 "no cocaine for a few years; I smoke marijuana qd"  . Sexual activity: Never    Partners: Female  Other Topics Concern  . Not on file  Social History Narrative   Lives alone.         Social Determinants of Health   Financial Resource Strain:   . Difficulty of Paying Living Expenses: Not on file  Food Insecurity:   . Worried About Programme researcher, broadcasting/film/video in the Last Year: Not on file  . Ran Out of Food in the Last Year: Not on file  Transportation Needs:   . Lack of Transportation (Medical): Not on file  . Lack of Transportation (Non-Medical): Not on file  Physical Activity:   . Days of Exercise per Week: Not on file  .  Minutes of Exercise per Session: Not on file  Stress:   . Feeling of Stress : Not on file  Social Connections:   . Frequency of Communication with Friends and Family: Not on file  . Frequency of Social Gatherings with Friends and Family: Not on file  . Attends Religious Services: Not on file  . Active Member of Clubs or Organizations: Not on file  . Attends Banker Meetings: Not on file  . Marital Status: Not on file  Intimate Partner Violence:   . Fear of Current or Ex-Partner: Not on file  . Emotionally Abused: Not on file  . Physically Abused: Not on file  . Sexually Abused: Not  on file    Outpatient Medications Prior to Visit  Medication Sig Dispense Refill  . ALPRAZolam (XANAX) 0.5 MG tablet Take 1 tablet (0.5 mg total) by mouth 3 (three) times daily. 90 tablet 5  . bictegravir-emtricitabine-tenofovir AF (BIKTARVY) 50-200-25 MG TABS tablet Take 1 tablet by mouth daily. 30 tablet 11  . cetirizine (ZYRTEC) 10 MG tablet TAKE 1 TABLET (10 MG TOTAL) BY MOUTH DAILY. 90 tablet 2  . [facility] LANCETS ORIGINAL MISC 1 application by Does not apply route daily. 90 each 4  . gemfibrozil (LOPID) 600 MG tablet TAKE 1 TABLET BY MOUTH TWICE A DAY 180 tablet 0  . glucose blood (ACCU-CHEK AVIVA) test strip Use as instructed 100 each 12  . hydrochlorothiazide (MICROZIDE) 12.5 MG capsule TAKE 1 CAPSULE BY MOUTH EVERY DAY 90 capsule 0  . lisinopril (ZESTRIL) 40 MG tablet TAKE 1 TABLET BY MOUTH EVERY DAY 90 tablet 0  . metFORMIN (GLUCOPHAGE) 500 MG tablet TAKE 1/2 OF A TABLET BY MOUTH TWICE A DAY WITH MEALS 90 tablet 0  . Misc Natural Products (OSTEO BI-FLEX JOINT SHIELD PO) Take 1 tablet by mouth daily.    . Multiple Vitamins-Minerals (ICAPS AREDS 2 PO) Take by mouth.    . zinc gluconate 50 MG tablet Take 50 mg by mouth daily.     Marland Kitchen levothyroxine (SYNTHROID) 25 MCG tablet TAKE 1 TABLET BY MOUTH EVERY DAY ON AN EMPTY STOMACH 90 tablet 0  . pravastatin (PRAVACHOL) 20 MG tablet TAKE 1 TABLET BY MOUTH EVERYDAY AT BEDTIME 90 tablet 0   No facility-administered medications prior to visit.    Allergies  Allergen Reactions  . Sulfamethoxazole-Trimethoprim     REACTION: severe rash    ROS Review of Systems A fourteen system review of systems was performed and found to be positive as per HPI.  Objective:    Physical Exam General:  Well Developed, well nourished, appropriate for stated age.  Neuro:  Alert and oriented,  extra-ocular muscles intact  HEENT:  Normocephalic, atraumatic, neck supple  Skin:  no gross rash, warm, pink. Cardiac:  RRR, S1 S2, no murmur Respiratory:  ECTA B/L  and A/P, Not using accessory muscles, speaking in full sentences- unlabored. Vascular:  Ext warm, no cyanosis apprec.; no gross edema Psych:  No HI/SI, judgement and insight good, Euthymic mood. Full Affect.  BP 120/71   Pulse 73   Ht 5\' 8"  (1.727 m)   Wt 208 lb 1.6 oz (94.4 kg)   SpO2 99%   BMI 31.64 kg/m  Wt Readings from Last 3 Encounters:  01/25/20 208 lb 1.6 oz (94.4 kg)  01/22/20 209 lb 4 oz (94.9 kg)  11/11/19 209 lb (94.8 kg)     Health Maintenance Due  Topic Date Due  . COLONOSCOPY  05/22/2017  . OPHTHALMOLOGY  EXAM  11/29/2017    There are no preventive care reminders to display for this patient.  Lab Results  Component Value Date   TSH 3.380 06/29/2019   Lab Results  Component Value Date   WBC 7.1 01/22/2020   HGB 14.8 01/22/2020   HCT 42.4 01/22/2020   MCV 94.0 01/22/2020   PLT 281 01/22/2020   Lab Results  Component Value Date   NA 139 01/22/2020   K 4.2 01/22/2020   CO2 26 01/22/2020   GLUCOSE 119 (H) 01/22/2020   BUN 19 01/22/2020   CREATININE 1.22 01/22/2020   BILITOT 0.3 01/22/2020   ALKPHOS 102 07/14/2018   AST 14 01/22/2020   ALT 8 (L) 01/22/2020   PROT 8.0 01/22/2020   ALBUMIN 4.6 07/14/2018   CALCIUM 9.9 01/22/2020   ANIONGAP 13 03/21/2017   Lab Results  Component Value Date   CHOL 154 01/22/2020   Lab Results  Component Value Date   HDL 49 01/22/2020   Lab Results  Component Value Date   LDLCALC 80 01/22/2020   Lab Results  Component Value Date   TRIG 156 (H) 01/22/2020   Lab Results  Component Value Date   CHOLHDL 3.1 01/22/2020   Lab Results  Component Value Date   HGBA1C 6.5 (H) 01/22/2020      Assessment & Plan:   Problem List Items Addressed This Visit      Cardiovascular and Mediastinum   Hypertension associated with diabetes (HCC) (Chronic)    -BP at goal. -Continue current medication regimen. -Stay well hydrated and follow a low sodium diet. -Will continue to monitor.        Endocrine    Hypothyroidism (Chronic)    -Last thyroid function tests wnl -Continue current medication regimen. -Will continue to monitor.       Controlled type 2 diabetes mellitus without complication, without long-term current use of insulin (HCC) - Primary (Chronic)    -Most recent A1c continues to be at goal. -Continue current medication regimen. -Monitor carbohydrates and reduce glucose. -Stay as active as possible. -Will continue to monitor.      Mixed diabetic hyperlipidemia associated with type 2 diabetes mellitus (HCC) (Chronic)    -Most recent lipid panel: Total cholesterol 154, HDL 49, triglycerides 156, LDL 80 -Discussed with patient LDL <70 due to existing comorbidities discussed increasing pravastatin to 40 mg.  Patient prefers to stay on 20 mg and improve medication adherence. -Recommend to follow a diet low in saturated and transfats.  Reduce simple carbohydrates. -Will continue to monitor and repeat lipid panel next OV.         No orders of the defined types were placed in this encounter.   Follow-up: Return in about 4 months (around 05/24/2020) for DM, HTN, HLD.   Note:  This note was prepared with assistance of Dragon voice recognition software. Occasional wrong-word or sound-a-like substitutions may have occurred due to the inherent limitations of voice recognition software.   Mayer Masker, PA-C

## 2020-01-25 NOTE — Assessment & Plan Note (Addendum)
-  Most recent lipid panel: Total cholesterol 154, HDL 49, triglycerides 156, LDL 80 -Discussed with patient LDL <70 due to existing comorbidities discussed increasing pravastatin to 40 mg.  Patient prefers to stay on 20 mg and improve medication adherence. -Recommend to follow a diet low in saturated and transfats.  Reduce simple carbohydrates. -Will continue to monitor and repeat lipid panel next OV.

## 2020-01-29 ENCOUNTER — Other Ambulatory Visit: Payer: Self-pay | Admitting: Physician Assistant

## 2020-01-29 DIAGNOSIS — E119 Type 2 diabetes mellitus without complications: Secondary | ICD-10-CM

## 2020-02-17 NOTE — Progress Notes (Signed)
[facility] - Clinic Note  02/19/2020     CHIEF COMPLAINT Patient presents for Retina Follow Up   HISTORY OF PRESENT ILLNESS: Jonathan Estes is a 64 y.o. male who presents to the clinic today for:   HPI    Retina Follow Up    Patient presents with  Other.  In right eye.  Duration of 1 year.  Since onset it is stable.  I, the attending physician,  performed the HPI with the patient and updated documentation appropriately.          Comments    1 year follow up chorioretinitis OD- Vision stable OU.  No new problems since last visit.   BS 128 November A1C 6.5       Last edited by Rennis ChrisZamora, Mardene Lessig, MD on 02/22/2020 12:31 AM. (History)    Pt hasn't noticed any changes or problems with his eyes.  Referring physician:  Thomasene Lotpalski, Deborah, DO [address] [place] [id number]  HISTORICAL INFORMATION:   Selected notes from the MEDICAL RECORD NUMBER Referred by Dr. Eugene GarnetS. [name] for concern of optic neuritis, wet AMD, macular separation;  LEE- 01.15.19 (S.[name]) [BCVA OD: 20/100 OS: 20/30] Ocular Hx- glaucoma suspect, APD OD PMH- type II DM, elevated cholesterol, HTN, HIV positive   CURRENT MEDICATIONS: No current outpatient medications on file. (Ophthalmic Drugs)   No current facility-administered medications for this visit. (Ophthalmic Drugs)   Current Outpatient Medications (Other)  Medication Sig  . ALPRAZolam (XANAX) 0.5 MG tablet Take 1 tablet (0.5 mg total) by mouth 3 (three) times daily.  . bictegravir-emtricitabine-tenofovir AF (BIKTARVY) 50-200-25 MG TABS tablet Take 1 tablet by mouth daily.  . cetirizine (ZYRTEC) 10 MG tablet TAKE 1 TABLET (10 MG TOTAL) BY MOUTH DAILY.  Marland Kitchen. gemfibrozil (LOPID) 600 MG tablet TAKE 1 TABLET BY MOUTH TWICE A DAY  . hydrochlorothiazide (MICROZIDE) 12.5 MG capsule TAKE 1 CAPSULE BY MOUTH EVERY DAY  . levothyroxine (SYNTHROID) 25 MCG tablet TAKE 1 TABLET BY MOUTH EVERY DAY ON AN EMPTY STOMACH  .  lisinopril (ZESTRIL) 40 MG tablet TAKE 1 TABLET BY MOUTH EVERY DAY  . metFORMIN (GLUCOPHAGE) 500 MG tablet 1/2 TABLET BY MOUTH TWICE A DAY WITH MEALS  . Misc Natural Products (OSTEO BI-FLEX JOINT SHIELD PO) Take 1 tablet by mouth daily.  . Multiple Vitamins-Minerals (ICAPS AREDS 2 PO) Take by mouth.  . pravastatin (PRAVACHOL) 20 MG tablet TAKE 1 TABLET BY MOUTH EVERYDAY AT BEDTIME  . zinc gluconate 50 MG tablet Take 50 mg by mouth daily.   . [facility] LANCETS ORIGINAL MISC 1 application by Does not apply route daily.  Marland Kitchen. glucose blood (ACCU-CHEK AVIVA) test strip Use as instructed   No current facility-administered medications for this visit. (Other)      REVIEW OF SYSTEMS: ROS    Positive for: Genitourinary, Endocrine, Cardiovascular, Eyes, Respiratory, Psychiatric, Heme/Lymph   Negative for: Constitutional, Gastrointestinal, Neurological, Skin, Musculoskeletal, HENT, Allergic/Imm   Last edited by Joni ReiningHodges, Robin, COA on 02/19/2020  9:58 AM. (History)       ALLERGIES Allergies  Allergen Reactions  . Sulfamethoxazole-Trimethoprim     REACTION: severe rash    PAST MEDICAL HISTORY Past Medical History:  Diagnosis Date  . AIDS (HCC) 07/12/2014  . AKI (acute kidney injury) (HCC) 08/12/2017  . Anxiety   . Arthritis    "hips, shoulders" (03/21/2017)  . Bilateral bunions    left foot worse  . Coronary artery disease involving native coronary artery  01/12/2015  . Diabetes mellitus type 2, controlled, without complications (HCC) 01/12/2015  . Diabetes type 2, controlled (HCC) 07/12/2014  . High triglycerides   . HTN (hypertension)   . Human immunodeficiency virus (HIV) disease (HCC) d'x 09/2007  . Hyperlipidemia   . Hypothyroid   . 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 BRONCHOSCOPY  09/29/2007   Bronchoalveolar lavage was  obtained from the left lower lobe.  Under fluoroscopy, transbronchial Hattie Perch  07/04/2010  . CONDYLOMA EXCISION/FULGURATION  ~ 1996   anal/notes 07/04/2010    FAMILY HISTORY Family History  Problem Relation Age of Onset  . Colon cancer Father   . Leukemia Maternal Grandfather     SOCIAL HISTORY Social History   Tobacco Use  . Smoking status: Never Smoker  . Smokeless tobacco: Never Used  Vaping Use  . Vaping Use: Never used  Substance Use Topics  . Alcohol use: Yes    Comment: 03/21/2017 "none since 01/12/2017"  . Drug use: Yes    Types: Marijuana, Cocaine    Comment: 03/21/2017 "no cocaine for a few years; I smoke marijuana qd"         OPHTHALMIC EXAM:  Base Eye Exam    Visual Acuity (Snellen - Linear)      Right Left   Dist [place] 20/20 20/20       Tonometry (Tonopen, 10:02 AM)      Right Left   Pressure 10 13       Pupils      Dark Light Shape React APD   Right 4 3 Round Brisk None   Left 4 3 Round Brisk None       Visual Fields (Counting fingers)      Left Right    Full Full       Extraocular Movement      Right Left    Full Full       Neuro/Psych    Oriented x3: Yes   Mood/Affect: Normal       Dilation    Both eyes: 1.0% Mydriacyl, 2.5% Phenylephrine @ 10:02 AM        Slit Lamp and Fundus Exam    Slit Lamp Exam      Right Left   Lids/Lashes Meibomian gland dysfunction, dermato Meibomian gland dysfunction, dermato   Conjunctiva/Sclera White and quiet White and quiet   Cornea mild Arcus, otherwise clear mild Arcus, otherwise clear   Anterior Chamber Deep and quiet deep, no cell   Iris Round and dilated to 6.5 mm Round and dilated to 6.5 mm   Lens 2+ Nuclear sclerosis, 2-3+ Cortical cataract 2+ Nuclear sclerosis, 2-3+ Cortical cataract   Vitreous Vitreous syneresis Vitreous syneresis       Fundus Exam      Right Left   Disc pink, sharp pink, sharp rim, mild pallor   C/D Ratio 0.2 0.2   Macula Good foveal reflex, trace Retinal pigment epithelial mottling, No heme or edema Good foveal reflex, mild Retinal pigment  epithelial mottling, No heme or edema   Vessels mild Vascular attenuation mild Vascular attenuation   Periphery Attached; subtle RPE atrophy from macula extending nasal to disc-- stably resolved; mild RPE changes/mild reticular degeneration Attached, No heme, mild reticular degeneration          IMAGING AND PROCEDURES  Imaging and Procedures for 05/14/17  OCT, Retina - OU - Both Eyes  Right Eye Quality was good. Central Foveal Thickness: 259. Progression has been stable. Findings include normal foveal contour, no IRF, no SRF (Full reconstitution of ellipsoid - stable).   Left Eye Quality was good. Central Foveal Thickness: 251. Progression has been stable. Findings include normal foveal contour, no IRF, no SRF.   Notes *Images captured and stored on drive  Diagnosis / Impression:  OD: NFP, no IRF/SRF; stable, full reconstitution of ellipsoid OS: NFP; no IRF/SRF  Clinical management:  See below  Abbreviations: NFP - Normal foveal profile. CME - cystoid macular edema. PED - pigment epithelial detachment. IRF - intraretinal fluid. SRF - subretinal fluid. EZ - ellipsoid zone. ERM - epiretinal membrane. ORA - outer retinal atrophy. ORT - outer retinal tubulation. 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 Asymptomatic HIV infection (HCC)  Z21   5. 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 Mild nonproliferative diabetic retinopathy of both eyes without macular edema associated with type 2 diabetes mellitus (HCC)  P23.3007   7. Retinal edema  H35.81 OCT, Retina - OU - Both Eyes  8. Combined forms of age-related cataract of both eyes  H25.813    1,2,3. Ocular syphilis - Chorioretinitis, posterior uveitis OD -- stably resolved  - pt reports onset of symptoms (decreased vision) upon waking on Saturday 03/16/17  -  presented on 03/20/17 with VA decreased to 20/50 and focal area of RPE and outer retinal atrophy from fovea extending nasally across disc  - initial OCT and fundus autofluorescence with focal outer retinal atrophy corresponding to exam  - FA showed window defect in area of atrophy + late leakage temporal to fovea and hyperfluorescence of the disc  - case discussed with Infectious Disease call physician, Dr. Judyann Munson  - labs positive for RPR, VDRL, and FTA-Ab  - acute syphilitic posterior placoid chorioretinopathy (ASPPC)  - pt reports trunk, palmar and plantar rashes in July 2018, but had a negative RPR  - likely false negative in setting of HIV  - was admitted to [facility] and started IV PCN on 03/21/17  - discharged on 03/23/17 with PICC line for continued IV PCN at home -- completed 14 days of antibiotic therapy  - today, BCVA 20/20 OU and OCT shows stable, full reconstitution of ellipsoid zone -- excellent response to IV PCN therapy   - no AC cell following completion of PF taper  - F/U 1-2 years  4. HIV infection  - diagnosed 20+ years ago  - contracted through unprotected sex with women and men  - has been on antiretrovirals for >10 yrs  - CD4 count 705, 08/17/2019  - HIV RNA viral load 109, 08/17/2019  - management per ID service  5. Optic neuropathy OD  - originally, had 2+ rAPD OD -- now resolved  - no rAPD today  - initial FA: + hyperfluorescence of disc consistent with inflammation  - retinal atrophy surrounding disc improved  6. Mild nonproliferative diabetic retinopathy w/o DME, both eyes  - exam with mild scattered MAs  - initial FA with scattered MAs, no NV  - OCT without diabetic macular edema, both eyes   - f/u in 1 year  7. No retinal edema on exam or OCT  8. Combined forms of age-related cataract OU-   - The symptoms of cataract, surgical options, and treatments and risks were discussed with patient.  - discussed diagnosis and progression  - not yet  visually significant  - monitor for now  Ophthalmic Meds Ordered this visit:  No orders of the defined types were placed in this encounter.     Return for 1-2 yr f/u.  There are no Patient Instructions on file for this visit.  Explained the diagnoses, plan, and follow up with the patient and they expressed understanding.  Patient expressed understanding of the importance of proper follow up care.   This document serves as a record of services personally performed by Karie Chimera, MD, PhD. It was created on their behalf by Glee Arvin. Manson Passey, OA an ophthalmic technician. The creation of this record is the provider's dictation and/or activities during the visit.    Electronically signed by: Glee Arvin. Manson Passey, [place] 12.15.2021 12:35 AM   This document serves as a record of services personally performed by Karie Chimera, MD, PhD. It was created on their behalf by Cristopher Estimable, COT an ophthalmic technician. The creation of this record is the provider's dictation and/or activities during the visit.    Electronically signed by: Cristopher Estimable, COT 02/19/20 @ 12:35 AM  Karie Chimera, M.D., Ph.D. Diseases & Surgery of the Retina and Vitreous [facility] 12.17.21  I have reviewed the above documentation for accuracy and completeness, and I agree with the above. Karie Chimera, M.D., Ph.D. 02/22/20 12:35 AM    Abbreviations: M myopia (nearsighted); A astigmatism; H hyperopia (farsighted); P presbyopia; Mrx spectacle prescription;  CTL contact lenses; OD right eye; OS left eye; OU both eyes  XT exotropia; ET esotropia; PEK punctate epithelial keratitis; PEE punctate epithelial erosions; DES dry eye syndrome; MGD meibomian gland dysfunction; ATs artificial tears; PFAT's preservative free artificial tears; NSC nuclear sclerotic cataract; PSC posterior subcapsular cataract; ERM epi-retinal membrane; PVD posterior vitreous detachment; RD retinal detachment; DM diabetes  mellitus; DR diabetic retinopathy; NPDR non-proliferative diabetic retinopathy; PDR proliferative diabetic retinopathy; CSME clinically significant macular edema; DME diabetic macular edema; dbh dot blot hemorrhages; CWS cotton wool spot; POAG primary open angle glaucoma; C/D cup-to-disc ratio; HVF humphrey visual field; GVF goldmann visual field; OCT optical coherence tomography; IOP intraocular pressure; BRVO Branch retinal vein occlusion; CRVO central retinal vein occlusion; CRAO central retinal artery occlusion; BRAO branch retinal artery occlusion; RT retinal tear; SB scleral buckle; PPV pars plana vitrectomy; VH Vitreous hemorrhage; PRP panretinal laser photocoagulation; IVK intravitreal kenalog; VMT vitreomacular traction; MH Macular hole;  NVD neovascularization of the disc; NVE neovascularization elsewhere; AREDS age related eye disease study; ARMD age related macular degeneration; POAG primary open angle glaucoma; EBMD epithelial/anterior basement membrane dystrophy; ACIOL anterior chamber intraocular lens; IOL intraocular lens; PCIOL posterior chamber intraocular lens; Phaco/IOL phacoemulsification with intraocular lens placement; PRK photorefractive keratectomy; LASIK laser assisted in situ keratomileusis; HTN hypertension; DM diabetes mellitus; COPD chronic obstructive pulmonary disease

## 2020-02-18 ENCOUNTER — Other Ambulatory Visit: Payer: Self-pay | Admitting: Physician Assistant

## 2020-02-18 DIAGNOSIS — I152 Hypertension secondary to endocrine disorders: Secondary | ICD-10-CM

## 2020-02-19 ENCOUNTER — Other Ambulatory Visit: Payer: Self-pay

## 2020-02-19 ENCOUNTER — Ambulatory Visit (INDEPENDENT_AMBULATORY_CARE_PROVIDER_SITE_OTHER): Payer: Medicare Other | Admitting: Ophthalmology

## 2020-02-19 DIAGNOSIS — H3091 Unspecified chorioretinal inflammation, right eye: Secondary | ICD-10-CM | POA: Diagnosis not present

## 2020-02-19 DIAGNOSIS — A5271 Late syphilitic oculopathy: Secondary | ICD-10-CM

## 2020-02-19 DIAGNOSIS — Z21 Asymptomatic human immunodeficiency virus [HIV] infection status: Secondary | ICD-10-CM

## 2020-02-19 DIAGNOSIS — H469 Unspecified optic neuritis: Secondary | ICD-10-CM | POA: Diagnosis not present

## 2020-02-19 DIAGNOSIS — H3581 Retinal edema: Secondary | ICD-10-CM | POA: Diagnosis not present

## 2020-02-19 DIAGNOSIS — H25813 Combined forms of age-related cataract, bilateral: Secondary | ICD-10-CM

## 2020-02-19 DIAGNOSIS — E113293 Type 2 diabetes mellitus with mild nonproliferative diabetic retinopathy without macular edema, bilateral: Secondary | ICD-10-CM

## 2020-02-22 ENCOUNTER — Encounter (INDEPENDENT_AMBULATORY_CARE_PROVIDER_SITE_OTHER): Payer: Self-pay | Admitting: Ophthalmology

## 2020-03-04 ENCOUNTER — Other Ambulatory Visit: Payer: Self-pay | Admitting: Physician Assistant

## 2020-03-04 DIAGNOSIS — E1159 Type 2 diabetes mellitus with other circulatory complications: Secondary | ICD-10-CM

## 2020-04-13 ENCOUNTER — Other Ambulatory Visit: Payer: Self-pay | Admitting: Physician Assistant

## 2020-04-13 DIAGNOSIS — E1169 Type 2 diabetes mellitus with other specified complication: Secondary | ICD-10-CM

## 2020-04-13 DIAGNOSIS — E781 Pure hyperglyceridemia: Secondary | ICD-10-CM

## 2020-04-13 DIAGNOSIS — E782 Mixed hyperlipidemia: Secondary | ICD-10-CM

## 2020-04-18 ENCOUNTER — Other Ambulatory Visit: Payer: Self-pay | Admitting: Physician Assistant

## 2020-04-18 DIAGNOSIS — E038 Other specified hypothyroidism: Secondary | ICD-10-CM

## 2020-04-18 DIAGNOSIS — E1169 Type 2 diabetes mellitus with other specified complication: Secondary | ICD-10-CM

## 2020-04-18 DIAGNOSIS — E782 Mixed hyperlipidemia: Secondary | ICD-10-CM

## 2020-05-16 ENCOUNTER — Other Ambulatory Visit: Payer: Self-pay | Admitting: Infectious Disease

## 2020-05-16 ENCOUNTER — Other Ambulatory Visit: Payer: Self-pay | Admitting: Physician Assistant

## 2020-05-16 DIAGNOSIS — E781 Pure hyperglyceridemia: Secondary | ICD-10-CM

## 2020-05-16 DIAGNOSIS — E782 Mixed hyperlipidemia: Secondary | ICD-10-CM

## 2020-05-16 DIAGNOSIS — E1169 Type 2 diabetes mellitus with other specified complication: Secondary | ICD-10-CM

## 2020-05-16 DIAGNOSIS — F419 Anxiety disorder, unspecified: Secondary | ICD-10-CM

## 2020-05-16 DIAGNOSIS — E1159 Type 2 diabetes mellitus with other circulatory complications: Secondary | ICD-10-CM

## 2020-05-17 ENCOUNTER — Telehealth: Payer: Self-pay

## 2020-05-17 DIAGNOSIS — F419 Anxiety disorder, unspecified: Secondary | ICD-10-CM

## 2020-05-17 MED ORDER — ALPRAZOLAM 0.5 MG PO TABS
0.5000 mg | ORAL_TABLET | Freq: Three times a day (TID) | ORAL | 5 refills | Status: DC
Start: 2020-05-17 — End: 2022-09-17

## 2020-05-17 NOTE — Telephone Encounter (Signed)
Received refill medication request for Alprazolam. Sending request to NP for approval. Valarie Cones

## 2020-05-17 NOTE — Telephone Encounter (Signed)
Medications refilled. Future refills to Dr. Daiva Eves.   Marcos Eke, NP 05/17/2020 4:47 PM

## 2020-05-17 NOTE — Addendum Note (Signed)
Addended by: Jeanine Luz D on: 05/17/2020 04:47 PM   Modules accepted: Orders

## 2020-05-23 ENCOUNTER — Telehealth: Payer: Self-pay | Admitting: Physician Assistant

## 2020-05-23 ENCOUNTER — Other Ambulatory Visit: Payer: Self-pay | Admitting: Physician Assistant

## 2020-05-23 DIAGNOSIS — E1159 Type 2 diabetes mellitus with other circulatory complications: Secondary | ICD-10-CM

## 2020-05-23 NOTE — Telephone Encounter (Signed)
Patient is already scheduled for tomorrow March 22 per last AVS, thanks.

## 2020-05-23 NOTE — Telephone Encounter (Signed)
Please contact pt to schedule appt per last avs

## 2020-05-23 NOTE — Telephone Encounter (Signed)
Patient would like a 90 day supply of hydrochlorothiazide and sent to [facility] in [place]. He is scheduled for tomorrow. Please advise, thanks.

## 2020-05-24 ENCOUNTER — Ambulatory Visit (INDEPENDENT_AMBULATORY_CARE_PROVIDER_SITE_OTHER): Payer: Medicare Other | Admitting: Physician Assistant

## 2020-05-24 ENCOUNTER — Encounter: Payer: Self-pay | Admitting: Physician Assistant

## 2020-05-24 ENCOUNTER — Other Ambulatory Visit: Payer: Self-pay

## 2020-05-24 VITALS — BP 127/80 | HR 68 | Temp 97.2°F | Ht 68.0 in | Wt 205.0 lb

## 2020-05-24 DIAGNOSIS — E559 Vitamin D deficiency, unspecified: Secondary | ICD-10-CM

## 2020-05-24 DIAGNOSIS — E039 Hypothyroidism, unspecified: Secondary | ICD-10-CM | POA: Diagnosis not present

## 2020-05-24 DIAGNOSIS — E782 Mixed hyperlipidemia: Secondary | ICD-10-CM

## 2020-05-24 DIAGNOSIS — I152 Hypertension secondary to endocrine disorders: Secondary | ICD-10-CM | POA: Diagnosis not present

## 2020-05-24 DIAGNOSIS — E1169 Type 2 diabetes mellitus with other specified complication: Secondary | ICD-10-CM

## 2020-05-24 DIAGNOSIS — B20 Human immunodeficiency virus [HIV] disease: Secondary | ICD-10-CM

## 2020-05-24 DIAGNOSIS — E1159 Type 2 diabetes mellitus with other circulatory complications: Secondary | ICD-10-CM | POA: Diagnosis not present

## 2020-05-24 MED ORDER — HYDROCHLOROTHIAZIDE 12.5 MG PO CAPS
ORAL_CAPSULE | ORAL | 1 refills | Status: DC
Start: 2020-05-24 — End: 2021-01-10

## 2020-05-24 NOTE — Assessment & Plan Note (Signed)
-  Followed by ID. -On Biktarvy.

## 2020-05-24 NOTE — Assessment & Plan Note (Signed)
-  Last Vitamin D 57.2, wnl's. -Will repeat Vit D today.

## 2020-05-24 NOTE — Patient Instructions (Signed)

## 2020-05-24 NOTE — Assessment & Plan Note (Signed)
-  Controlled. -Continue current medication regimen. Provided refill. -Will collect CMP for medication monitoring.

## 2020-05-24 NOTE — Telephone Encounter (Signed)
Pt seen today in office. Refill given by Provider. AS, CMA

## 2020-05-24 NOTE — Progress Notes (Signed)
Established Patient Office Visit  Subjective:  Patient ID: Jonathan Estes, male    DOB: 09/22/1955  Age: 65 y.o. MRN: [id number]  CC:  Chief Complaint  Patient presents with  . Follow-up    Med Refill    HPI Jonathan Estes presents for follow up on diabetes mellitus, hypertension and hyperlipidemia. Has no acute concerns today.  Diabetes: Pt denies increased urination or thirst. Pt reports medication compliance. No hypoglycemic events. Does not check glucose at home. Has reduced sweets.   HTN: Pt denies chest pain, palpitations, dizziness, headache or lower extremity swelling. Taking medication as directed without side effects. Continues to check BP at home and readings range <130/80.   HLD: Pt reports medication non-adherence, continues to miss doses before bedtime. Also likes to eat steak and french fries. Has not tried other statins in the past. Reports has two bottles of Pravastatin and does not need refills at this time.   Past Medical History:  Diagnosis Date  . AIDS ([place]) 07/12/2014  . AKI (acute kidney injury) ([place]) 08/12/2017  . Anxiety   . Arthritis    "hips, shoulders" (03/21/2017)  . Bilateral bunions    left foot worse  . Coronary artery disease involving native coronary artery 01/12/2015  . Diabetes mellitus type 2, controlled, without complications ([place]) 49/04/98  . Diabetes type 2, controlled ([place]) 07/12/2014  . High triglycerides   . HTN (hypertension)   . Human immunodeficiency virus (HIV) disease ([place]) d'x 09/2007  . Hyperlipidemia   . Hypothyroid   . 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 BRONCHOSCOPY  09/29/2007   Bronchoalveolar lavage was  obtained from the left lower lobe.  Under fluoroscopy, transbronchial Archie Endo 07/04/2010  . CONDYLOMA EXCISION/FULGURATION  ~ 1996   anal/notes 07/04/2010    Family History  Problem Relation Age of Onset  . Colon cancer  Father   . Leukemia Maternal Grandfather     Social History   Socioeconomic History  . Marital status: Single    Spouse name: Not on file  . Number of children: Not on file  . Years of education: Not on file  . Highest education level: Not on file  Occupational History  . Not on file  Tobacco Use  . Smoking status: Never Smoker  . Smokeless tobacco: Never Used  Vaping Use  . Vaping Use: Never used  Substance and Sexual Activity  . Alcohol use: Yes    Comment: 03/21/2017 "none since 01/12/2017"  . Drug use: Yes    Types: Marijuana, Cocaine    Comment: 03/21/2017 "no cocaine for a few years; I smoke marijuana qd"  . Sexual activity: Never    Partners: Female  Other Topics Concern  . Not on file  Social History Narrative   Lives alone.         Social Determinants of Health   Financial Resource Strain: Not on file  Food Insecurity: Not on file  Transportation Needs: Not on file  Physical Activity: Not on file  Stress: Not on file  Social Connections: Not on file  Intimate Partner Violence: Not on file    Outpatient Medications Prior to Visit  Medication Sig Dispense Refill  . ALPRAZolam (XANAX) 0.5 MG tablet Take 1 tablet (0.5 mg total) by mouth 3 (three) times daily. 90 tablet 5  . bictegravir-emtricitabine-tenofovir AF (BIKTARVY) 50-200-25 MG TABS tablet Take 1 tablet by mouth daily. 30 tablet  11  . cetirizine (ZYRTEC) 10 MG tablet TAKE 1 TABLET (10 MG TOTAL) BY MOUTH DAILY. 90 tablet 2  . [facility] LANCETS ORIGINAL MISC 1 application by Does not apply route daily. 90 each 4  . gemfibrozil (LOPID) 600 MG tablet TAKE 1 TABLET BY MOUTH TWICE A DAY 180 tablet 0  . glucose blood (ACCU-CHEK AVIVA) test strip Use as instructed 100 each 12  . levothyroxine (SYNTHROID) 25 MCG tablet TAKE 1 TABLET BY MOUTH EVERY DAY ON AN EMPTY STOMACH 90 tablet 0  . lisinopril (ZESTRIL) 40 MG tablet TAKE 1 TABLET BY MOUTH EVERY DAY 90 tablet 0  . metFORMIN (GLUCOPHAGE) 500 MG tablet 1/2 TABLET BY  MOUTH TWICE A DAY WITH MEALS 90 tablet 0  . Misc Natural Products (OSTEO BI-FLEX JOINT SHIELD PO) Take 1 tablet by mouth daily.    . Multiple Vitamins-Minerals (ICAPS AREDS 2 PO) Take by mouth.    . pravastatin (PRAVACHOL) 20 MG tablet TAKE 1 TABLET BY MOUTH EVERYDAY AT BEDTIME 90 tablet 0  . zinc gluconate 50 MG tablet Take 50 mg by mouth daily.     . hydrochlorothiazide (MICROZIDE) 12.5 MG capsule TAKE 1 CAPSULE BY MOUTH EVERY DAY** NEEDS APPT. FOR REFILLS 60 capsule 0  . FLUCELVAX QUADRIVALENT 0.5 ML injection Inject 0.5 mLs into the muscle once.     No facility-administered medications prior to visit.    Allergies  Allergen Reactions  . Sulfamethoxazole-Trimethoprim     REACTION: severe rash    ROS Review of Systems A fourteen system review of systems was performed and found to be positive as per HPI.    Objective:    Physical Exam General:  Well Developed, well nourished, in no acute distress.  Neuro:  Alert and oriented,  extra-ocular muscles intact  HEENT:  Normocephalic, atraumatic, neck supple Skin:  no gross rash, warm, pink. Cardiac:  RRR, S1 S2 wnl's  Respiratory:  ECTA B/L w/o wheezing, Not using accessory muscles, speaking in full sentences- unlabored. Vascular:  Ext warm, no cyanosis apprec.; no edema  Psych:  No HI/SI, judgement and insight good, Euthymic mood. Full Affect.   BP 127/80 (BP Location: Left Arm, Patient Position: Sitting)   Pulse 68   Temp (!) 97.2 F (36.2 C) (Temporal)   Ht '5\' 8"'  (1.727 m)   Wt 205 lb (93 kg)   SpO2 99%   BMI 31.17 kg/m  Wt Readings from Last 3 Encounters:  05/24/20 205 lb (93 kg)  01/25/20 208 lb 1.6 oz (94.4 kg)  01/22/20 209 lb 4 oz (94.9 kg)     Health Maintenance Due  Topic Date Due  . COLONOSCOPY (Pts 45-36yr Insurance coverage will need to be confirmed)  05/22/2017  . OPHTHALMOLOGY EXAM  11/29/2017  . COVID-19 Vaccine (4 - Booster for Pfizer series) 04/24/2020    There are no preventive care reminders  to display for this patient.  Lab Results  Component Value Date   TSH 3.380 06/29/2019   Lab Results  Component Value Date   WBC 7.1 01/22/2020   HGB 14.8 01/22/2020   HCT 42.4 01/22/2020   MCV 94.0 01/22/2020   PLT 281 01/22/2020   Lab Results  Component Value Date   NA 139 01/22/2020   K 4.2 01/22/2020   CO2 26 01/22/2020   GLUCOSE 119 (H) 01/22/2020   BUN 19 01/22/2020   CREATININE 1.22 01/22/2020   BILITOT 0.3 01/22/2020   ALKPHOS 102 07/14/2018   AST 14 01/22/2020   ALT 8 (  L) 01/22/2020   PROT 8.0 01/22/2020   ALBUMIN 4.6 07/14/2018   CALCIUM 9.9 01/22/2020   ANIONGAP 13 03/21/2017   Lab Results  Component Value Date   CHOL 154 01/22/2020   Lab Results  Component Value Date   HDL 49 01/22/2020   Lab Results  Component Value Date   LDLCALC 80 01/22/2020   Lab Results  Component Value Date   TRIG 156 (H) 01/22/2020   Lab Results  Component Value Date   CHOLHDL 3.1 01/22/2020   Lab Results  Component Value Date   HGBA1C 6.5 (H) 01/22/2020      Assessment & Plan:   Problem List Items Addressed This Visit      Cardiovascular and Mediastinum   Hypertension associated with diabetes ([place]) (Chronic)    -Controlled. -Continue current medication regimen. Provided refill. -Will collect CMP for medication monitoring.      Relevant Medications   hydrochlorothiazide (MICROZIDE) 12.5 MG capsule   Other Relevant Orders   Comp Met (CMET)   CBC w/Diff     Endocrine   Hypothyroidism (Chronic)    -Last TSH wnl -Continue current medication regimen. -Rechecking thyroid labs today. Pending results will make medication adjustments if indicated.       Relevant Orders   TSH   Controlled type 2 diabetes mellitus without complication, without long-term current use of insulin (HCC) - Primary (Chronic)    -Last A1c 6.5, stable. -Will repeat A1c today. -Continue current medication regimen. Pending results will adjust treatment plan if indicated. -Continue  with reducing sugar and monitor carbohydrates. -Will continue to monitor.      Mixed diabetic hyperlipidemia associated with type 2 diabetes mellitus (HCC) (Chronic)    -Last lipid panel, LDL 80 (goal <70). -Discussed with patient increasing Pravastatin to 40 mg and is agreeable to increase medication if LDL continues to be above goal. Pending results will notify patient. Also recommend to improve medication adherence.  -Continue gemfibrozil. -Recommend to follow a heart healthy diet and reduce saturated and trans fats.       Relevant Medications   hydrochlorothiazide (MICROZIDE) 12.5 MG capsule   Other Relevant Orders   Lipid Profile   CBC w/Diff     Other   Human immunodeficiency virus (HIV) disease ([place]) (Chronic)    -Followed by ID. -On Biktarvy.      Relevant Medications   FLUCELVAX QUADRIVALENT 0.5 ML injection   Vitamin D deficiency    -Last Vitamin D 57.2, wnl's. -Will repeat Vit D today.      Relevant Orders   Vitamin D (25 hydroxy)      Meds ordered this encounter  Medications  . hydrochlorothiazide (MICROZIDE) 12.5 MG capsule    Sig: TAKE 1 CAPSULE BY MOUTH EVERY DAY    Dispense:  90 capsule    Refill:  1    Order Specific Question:   Supervising Provider    Answer:   Beatrice Lecher D [[id number]]    Follow-up: Return in about 4 months (around 09/23/2020) for CPE.    Lorrene Reid, PA-C

## 2020-05-24 NOTE — Assessment & Plan Note (Addendum)
-  Last lipid panel, LDL 80 (goal <70). -Discussed with patient increasing Pravastatin to 40 mg and is agreeable to increase medication if LDL continues to be above goal. Pending results will notify patient. Also recommend to improve medication adherence.  -Continue gemfibrozil. -Recommend to follow a heart healthy diet and reduce saturated and trans fats.

## 2020-05-24 NOTE — Assessment & Plan Note (Signed)
-  Last A1c 6.5, stable. -Will repeat A1c today. -Continue current medication regimen. Pending results will adjust treatment plan if indicated. -Continue with reducing sugar and monitor carbohydrates. -Will continue to monitor.

## 2020-05-24 NOTE — Assessment & Plan Note (Signed)
-  Last TSH wnl -Continue current medication regimen. -Rechecking thyroid labs today. Pending results will make medication adjustments if indicated.  

## 2020-05-25 LAB — CBC WITH DIFFERENTIAL/PLATELET
Basophils Absolute: 0.1 10*3/uL (ref 0.0–0.2)
Basos: 1 %
EOS (ABSOLUTE): 0.3 10*3/uL (ref 0.0–0.4)
Eos: 4 %
Hematocrit: 41.2 % (ref 37.5–51.0)
Hemoglobin: 14.6 g/dL (ref 13.0–17.7)
Immature Grans (Abs): 0 10*3/uL (ref 0.0–0.1)
Immature Granulocytes: 0 %
Lymphocytes Absolute: 2.7 10*3/uL (ref 0.7–3.1)
Lymphs: 33 %
MCH: 33.3 pg — ABNORMAL HIGH (ref 26.6–33.0)
MCHC: 35.4 g/dL (ref 31.5–35.7)
MCV: 94 fL (ref 79–97)
Monocytes Absolute: 0.7 10*3/uL (ref 0.1–0.9)
Monocytes: 9 %
Neutrophils Absolute: 4.4 10*3/uL (ref 1.4–7.0)
Neutrophils: 53 %
Platelets: 277 10*3/uL (ref 150–450)
RBC: 4.38 x10E6/uL (ref 4.14–5.80)
RDW: 12 % (ref 11.6–15.4)
WBC: 8.3 10*3/uL (ref 3.4–10.8)

## 2020-05-25 LAB — COMPREHENSIVE METABOLIC PANEL
ALT: 9 IU/L (ref 0–44)
AST: 14 IU/L (ref 0–40)
Albumin/Globulin Ratio: 1.5 (ref 1.2–2.2)
Albumin: 4.7 g/dL (ref 3.8–4.8)
Alkaline Phosphatase: 133 IU/L — ABNORMAL HIGH (ref 44–121)
BUN/Creatinine Ratio: 16 (ref 10–24)
BUN: 18 mg/dL (ref 8–27)
Bilirubin Total: 0.4 mg/dL (ref 0.0–1.2)
CO2: 24 mmol/L (ref 20–29)
Calcium: 9.6 mg/dL (ref 8.6–10.2)
Chloride: 98 mmol/L (ref 96–106)
Creatinine, Ser: 1.12 mg/dL (ref 0.76–1.27)
Globulin, Total: 3.1 g/dL (ref 1.5–4.5)
Glucose: 107 mg/dL — ABNORMAL HIGH (ref 65–99)
Potassium: 4.6 mmol/L (ref 3.5–5.2)
Sodium: 138 mmol/L (ref 134–144)
Total Protein: 7.8 g/dL (ref 6.0–8.5)
eGFR: 74 mL/min/{1.73_m2} (ref >59)

## 2020-05-25 LAB — LIPID PANEL
Chol/HDL Ratio: 2.9 ratio (ref 0.0–5.0)
Cholesterol, Total: 147 mg/dL (ref 100–199)
HDL: 51 mg/dL (ref >39)
LDL Chol Calc (NIH): 74 mg/dL (ref 0–99)
Triglycerides: 125 mg/dL (ref 0–149)
VLDL Cholesterol Cal: 22 mg/dL (ref 5–40)

## 2020-05-25 LAB — HEMOGLOBIN A1C
Est. average glucose Bld gHb Est-mCnc: 146 mg/dL
Hgb A1c MFr Bld: 6.7 % — ABNORMAL HIGH (ref 4.8–5.6)

## 2020-05-25 LAB — TSH: TSH: 3.1 u[IU]/mL (ref 0.450–4.500)

## 2020-05-25 LAB — VITAMIN D 25 HYDROXY (VIT D DEFICIENCY, FRACTURES): Vit D, 25-Hydroxy: 56 ng/mL (ref 30.0–100.0)

## 2020-05-25 MED ORDER — PRAVASTATIN SODIUM 20 MG PO TABS: 40.0000 mg | ORAL_TABLET | Freq: Every day | ORAL | 0 refills | Status: DC

## 2020-05-25 NOTE — Addendum Note (Signed)
Addended by: Sylvester Harder on: 05/25/2020 10:43 AM   Modules accepted: Orders

## 2020-06-17 ENCOUNTER — Other Ambulatory Visit: Payer: Self-pay | Admitting: Physician Assistant

## 2020-06-17 DIAGNOSIS — E119 Type 2 diabetes mellitus without complications: Secondary | ICD-10-CM

## 2020-06-22 ENCOUNTER — Other Ambulatory Visit: Payer: Self-pay | Admitting: Physician Assistant

## 2020-06-22 DIAGNOSIS — E1169 Type 2 diabetes mellitus with other specified complication: Secondary | ICD-10-CM

## 2020-06-22 NOTE — Telephone Encounter (Signed)
Recommend changing statin to Crestor 10 mg. Please contact patient and notify of medication change and send rx if pt is agreeable.  Thank you, Kandis Cocking

## 2020-06-22 NOTE — Telephone Encounter (Signed)
Would you like to change medication?

## 2020-06-22 NOTE — Telephone Encounter (Signed)
Pt states he has several bottles of 20mg  and is taking 2 pills of the pravastatin now and doesn't need any meds sent to pharmacy. AS, CMA

## 2020-07-07 ENCOUNTER — Other Ambulatory Visit: Payer: Self-pay | Admitting: Physician Assistant

## 2020-07-07 DIAGNOSIS — E1159 Type 2 diabetes mellitus with other circulatory complications: Secondary | ICD-10-CM

## 2020-07-07 DIAGNOSIS — Z0000 Encounter for general adult medical examination without abnormal findings: Secondary | ICD-10-CM

## 2020-07-07 DIAGNOSIS — E1169 Type 2 diabetes mellitus with other specified complication: Secondary | ICD-10-CM

## 2020-07-07 DIAGNOSIS — E039 Hypothyroidism, unspecified: Secondary | ICD-10-CM

## 2020-07-07 DIAGNOSIS — E559 Vitamin D deficiency, unspecified: Secondary | ICD-10-CM

## 2020-07-11 ENCOUNTER — Other Ambulatory Visit: Payer: Medicare Other

## 2020-07-11 ENCOUNTER — Other Ambulatory Visit: Payer: Self-pay

## 2020-07-11 DIAGNOSIS — E559 Vitamin D deficiency, unspecified: Secondary | ICD-10-CM | POA: Diagnosis not present

## 2020-07-11 DIAGNOSIS — E039 Hypothyroidism, unspecified: Secondary | ICD-10-CM

## 2020-07-11 DIAGNOSIS — E1169 Type 2 diabetes mellitus with other specified complication: Secondary | ICD-10-CM | POA: Diagnosis not present

## 2020-07-11 DIAGNOSIS — I152 Hypertension secondary to endocrine disorders: Secondary | ICD-10-CM

## 2020-07-11 DIAGNOSIS — Z0000 Encounter for general adult medical examination without abnormal findings: Secondary | ICD-10-CM

## 2020-07-11 DIAGNOSIS — E1159 Type 2 diabetes mellitus with other circulatory complications: Secondary | ICD-10-CM | POA: Diagnosis not present

## 2020-07-11 DIAGNOSIS — E782 Mixed hyperlipidemia: Secondary | ICD-10-CM | POA: Diagnosis not present

## 2020-07-12 LAB — CBC
Hematocrit: 42.7 % (ref 37.5–51.0)
Hemoglobin: 14.6 g/dL (ref 13.0–17.7)
MCH: 32.3 pg (ref 26.6–33.0)
MCHC: 34.2 g/dL (ref 31.5–35.7)
MCV: 95 fL (ref 79–97)
Platelets: 264 10*3/uL (ref 150–450)
RBC: 4.52 x10E6/uL (ref 4.14–5.80)
RDW: 12.3 % (ref 11.6–15.4)
WBC: 7 10*3/uL (ref 3.4–10.8)

## 2020-07-12 LAB — LIPID PANEL
Chol/HDL Ratio: 2.3 ratio (ref 0.0–5.0)
Cholesterol, Total: 119 mg/dL (ref 100–199)
HDL: 51 mg/dL (ref >39)
LDL Chol Calc (NIH): 51 mg/dL (ref 0–99)
Triglycerides: 87 mg/dL (ref 0–149)
VLDL Cholesterol Cal: 17 mg/dL (ref 5–40)

## 2020-07-12 LAB — COMPREHENSIVE METABOLIC PANEL
ALT: 7 IU/L (ref 0–44)
AST: 12 IU/L (ref 0–40)
Albumin/Globulin Ratio: 1.8 (ref 1.2–2.2)
Albumin: 4.8 g/dL (ref 3.8–4.8)
Alkaline Phosphatase: 109 IU/L (ref 44–121)
BUN/Creatinine Ratio: 16 (ref 10–24)
BUN: 20 mg/dL (ref 8–27)
Bilirubin Total: 0.2 mg/dL (ref 0.0–1.2)
CO2: 26 mmol/L (ref 20–29)
Calcium: 9.7 mg/dL (ref 8.6–10.2)
Chloride: 99 mmol/L (ref 96–106)
Creatinine, Ser: 1.26 mg/dL (ref 0.76–1.27)
Globulin, Total: 2.7 g/dL (ref 1.5–4.5)
Glucose: 98 mg/dL (ref 65–99)
Potassium: 4.6 mmol/L (ref 3.5–5.2)
Sodium: 140 mmol/L (ref 134–144)
Total Protein: 7.5 g/dL (ref 6.0–8.5)
eGFR: 64 mL/min/{1.73_m2} (ref >59)

## 2020-07-12 LAB — VITAMIN D 25 HYDROXY (VIT D DEFICIENCY, FRACTURES): Vit D, 25-Hydroxy: 67.5 ng/mL (ref 30.0–100.0)

## 2020-07-12 LAB — HEMOGLOBIN A1C
Est. average glucose Bld gHb Est-mCnc: 137 mg/dL
Hgb A1c MFr Bld: 6.4 % — ABNORMAL HIGH (ref 4.8–5.6)

## 2020-07-12 LAB — TSH: TSH: 3.33 u[IU]/mL (ref 0.450–4.500)

## 2020-08-04 ENCOUNTER — Other Ambulatory Visit: Payer: Self-pay | Admitting: Infectious Disease

## 2020-08-04 DIAGNOSIS — B20 Human immunodeficiency virus [HIV] disease: Secondary | ICD-10-CM

## 2020-08-06 ENCOUNTER — Other Ambulatory Visit: Payer: Self-pay | Admitting: Physician Assistant

## 2020-08-06 DIAGNOSIS — E1159 Type 2 diabetes mellitus with other circulatory complications: Secondary | ICD-10-CM

## 2020-08-08 ENCOUNTER — Other Ambulatory Visit: Payer: Self-pay | Admitting: Physician Assistant

## 2020-08-08 DIAGNOSIS — E781 Pure hyperglyceridemia: Secondary | ICD-10-CM

## 2020-08-08 DIAGNOSIS — E1169 Type 2 diabetes mellitus with other specified complication: Secondary | ICD-10-CM

## 2020-08-08 DIAGNOSIS — E782 Mixed hyperlipidemia: Secondary | ICD-10-CM

## 2020-09-14 ENCOUNTER — Other Ambulatory Visit: Payer: Self-pay | Admitting: Physician Assistant

## 2020-09-14 DIAGNOSIS — E119 Type 2 diabetes mellitus without complications: Secondary | ICD-10-CM

## 2020-09-23 ENCOUNTER — Ambulatory Visit (INDEPENDENT_AMBULATORY_CARE_PROVIDER_SITE_OTHER): Payer: Medicare Other | Admitting: Physician Assistant

## 2020-09-23 ENCOUNTER — Other Ambulatory Visit: Payer: Self-pay

## 2020-09-23 ENCOUNTER — Encounter: Payer: Self-pay | Admitting: Physician Assistant

## 2020-09-23 VITALS — BP 95/63 | HR 60 | Temp 98.1°F | Ht 68.0 in | Wt 190.0 lb

## 2020-09-23 DIAGNOSIS — Z0000 Encounter for general adult medical examination without abnormal findings: Secondary | ICD-10-CM

## 2020-09-23 DIAGNOSIS — E1169 Type 2 diabetes mellitus with other specified complication: Secondary | ICD-10-CM | POA: Diagnosis not present

## 2020-09-23 DIAGNOSIS — Z23 Encounter for immunization: Secondary | ICD-10-CM | POA: Diagnosis not present

## 2020-09-23 DIAGNOSIS — E782 Mixed hyperlipidemia: Secondary | ICD-10-CM

## 2020-09-23 LAB — POCT UA - MICROALBUMIN
Creatinine, POC: 50 mg/dL
Microalbumin Ur, POC: 10 mg/L

## 2020-09-23 MED ORDER — PRAVASTATIN SODIUM 40 MG PO TABS: 40.0000 mg | ORAL_TABLET | Freq: Every day | ORAL | 1 refills | Status: DC

## 2020-09-23 NOTE — Progress Notes (Signed)
Male physical   Impression and Recommendations:    1. Healthcare maintenance   2. Controlled type 2 diabetes mellitus with other specified complication, without long-term current use of insulin (HCC)   3. Need for Tdap vaccination   4. Mixed diabetic hyperlipidemia associated with type 2 diabetes mellitus (HCC)      1) Anticipatory Guidance: Skin CA prevention- recommend to use sunscreen when outside along with skin surveillance; eat a balanced and modest diet; physical activity at least 25 minutes per day or minimum of 150 min/ week moderate to intense activity.  2) Immunizations / Screenings / Labs:   All immunizations are up-to-date per recommendations or will be updated today if pt allows.    - Patient understands with dental and vision screens they will schedule independently.  - Will obtain CBC, CMP, HgA1c, Lipid panel, TSH when fasting, if not already done past 12 mo/ recently. - Patient defers repeat colonoscopy due to transportation issues. No candidate for cologuard due to FHx of colon cancer (father). - Will update Tdap. UTD on Shingrix and pneumococcal. - UA microalbumin collected today, A:C 30-300 mg/g, stable.  3) Weight:  Recommend to improve diet habits to improve overall feelings of well being and objective health data. Improve nutrient density of diet through increasing intake of fruits and vegetables and decreasing saturated fats, white flour products and refined sugars.   4) Healthcare Maintenance: -Continue current medication regimen. Recommend reduce sugar and carbohydrate intake. Follow a low fat diet. -Stay well hydrated. -Follow up in 3 months for DM, HTN, HLD  Orders Placed This Encounter  Procedures   Tdap vaccine greater than or equal to 7yo IM   POCT UA - Microalbumin    Meds ordered this encounter  Medications   pravastatin (PRAVACHOL) 40 MG tablet    Sig: Take 1 tablet (40 mg total) by mouth daily.    Dispense:  90 tablet    Refill:  1     Order Specific Question:   Supervising Provider    Answer:   Nani Gasser D [[id number]]     Return in about 3 months (around 12/24/2020) for DM, HTN, HLD .     Gross side effects, risk and benefits, and alternatives of medications discussed with patient.  Patient is aware that all medications have potential side effects and we are unable to predict every side effect or drug-drug interaction that may occur.  Expresses verbal understanding and consents to current therapy plan and treatment regimen.  Please see AVS handed out to patient at the end of our visit for further patient instructions/ counseling done pertaining to today's office visit.       Subjective:        CC: CPE   HPI: Jonathan Estes is a 65 y.o. male who presents to [facility] today for a yearly health maintenance exam.     Health Maintenance Summary  - Reviewed and updated, unless pt declines services.  Last Cologuard or Colonoscopy:   05/22/2012- repeat in 5 years   Family history of Colon CA:  yes, father Tobacco History Reviewed:   yes, never a smoker Abdominal Ultrasound:   n/a CT scan for screening lung CA: n/a  Alcohol / drug use:   No concerns, no use / no daily use Male history: STD concerns:   none Additional penile/ urinary concerns:   none   Additional concerns beyond Health Maintenance issues:  none    Immunization  History  Administered Date(s) Administered   Hepatitis A 04/12/2008, 09/13/2008   Hepatitis B 04/12/2008, 09/13/2008, 02/07/2009, 12/17/2011   Hepatitis B, adult 12/01/2015, 01/04/2016, 05/30/2016   Influenza Split 11/27/2010, 12/17/2011   Influenza Whole 12/29/2007, 01/24/2009, 04/17/2010   Influenza,inj,Quad PF,6+ Mos 11/10/2012, 11/04/2013, 12/01/2015, 11/29/2016, 12/16/2017, 10/29/2018   Influenza,inj,Quad PF,6-35 Mos 12/16/2014   Influenza-Unspecified 11/27/2019   PFIZER(Purple Top)SARS-COV-2 Vaccination 05/24/2019, 06/14/2019,  10/23/2019   Pneumococcal Conjugate-13 08/12/2017   Pneumococcal Polysaccharide-23 10/07/2007, 01/30/2010, 04/04/2017   Td 02/07/2009   Tdap 01/29/2010, 09/23/2020   Zoster Recombinat (Shingrix) 10/29/2018, 01/27/2019     Health Maintenance  Topic Date Due   COLONOSCOPY (Pts 45-68yrs Insurance coverage will need to be confirmed)  05/22/2017   OPHTHALMOLOGY EXAM  11/29/2017   COVID-19 Vaccine (4 - Booster for Pfizer series) 01/23/2020   INFLUENZA VACCINE  10/03/2020   HEMOGLOBIN A1C  01/11/2021   FOOT EXAM  09/23/2021   Pneumococcal Vaccine 76-41 Years old (4 - PPSV23 or PCV20) 12/30/2022   TETANUS/TDAP  09/24/2030   PNEUMOCOCCAL POLYSACCHARIDE VACCINE AGE 33-64 HIGH RISK  Completed   Hepatitis C Screening  Completed   HIV Screening  Completed   Zoster Vaccines- Shingrix  Completed   HPV VACCINES  Aged Out       Wt Readings from Last 3 Encounters:  09/23/20 190 lb (86.2 kg)  05/24/20 205 lb (93 kg)  01/25/20 208 lb 1.6 oz (94.4 kg)   BP Readings from Last 3 Encounters:  09/23/20 95/63  05/24/20 127/80  01/25/20 120/71   Pulse Readings from Last 3 Encounters:  09/23/20 60  05/24/20 68  01/25/20 73    Patient Active Problem List   Diagnosis Date Noted   AKI (acute kidney injury) (HCC) 08/12/2017   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   Uveitis 03/21/2017   Change in vision 03/21/2017   Microalbuminuria due to type 2 diabetes mellitus (HCC) 12/31/2016   Vitamin D deficiency 12/31/2016   Low level of high density lipoprotein (HDL) 12/30/2016   Hypertriglyceridemia 12/30/2016   Family history of colon cancer in father- in 60's 11/26/2016   External hemorrhoids without complication 11/26/2016   Environmental and seasonal allergies 07/30/2016   Dyslipidemia with low high density lipoprotein (HDL) cholesterol with hypertriglyceridemia due to type 2 diabetes mellitus (HCC) 07/23/2016   Diabetic nephropathy associated with type 2 diabetes mellitus: Microalbuminuria 07/23/2016    CAD (coronary artery disease) 07/16/2011   Dyspnea 05/10/2011   Abnormal chest CT 04/12/2011   Anxiety 01/29/2011   Mixed diabetic hyperlipidemia associated with type 2 diabetes mellitus (HCC) 07/11/2010   PULMONARY NODULE 02/21/2010   ORBITAL FLOOR , CLOSED FRACTURE 02/21/2010   UNSPECIFIED VISUAL LOSS 12/26/2009   TRANSIENT GLOBAL AMNESIA 12/26/2009   DEPRESSION/ANXIETY 06/27/2009   SHOULDER STRAIN 06/13/2009   Controlled type 2 diabetes mellitus without complication, without long-term current use of insulin (HCC) 02/07/2009   Hypothyroidism 04/24/2008   Hypertension associated with diabetes (HCC) 04/12/2008   FATIGUE 04/12/2008   METHICILLIN SUSCEPTIBLE STAPH INF CCE & UNS SITE 10/14/2007   Human immunodeficiency virus (HIV) disease (HCC) 10/14/2007   CANDIDIASIS OF MOUTH 10/14/2007   PNEUMOCYSTIS PNEUMONIA 10/14/2007   h/o COCAINE ABUSE, EPISODIC 10/14/2007   CUTANEOUS ERUPTIONS, DRUG-INDUCED 10/14/2007   CANNABIS ABUSE, HX OF 10/14/2007    Past Medical History:  Diagnosis Date   AIDS (HCC) 07/12/2014   AKI (acute kidney injury) (HCC) 08/12/2017   Anxiety    Arthritis    "hips, shoulders" (03/21/2017)   Bilateral bunions    left foot  worse   Coronary artery disease involving native coronary artery 01/12/2015   Diabetes mellitus type 2, controlled, without complications (HCC) 01/12/2015   Diabetes type 2, controlled (HCC) 07/12/2014   High triglycerides    HTN (hypertension)    Human immunodeficiency virus (HIV) disease (HCC) d'x 09/2007   Hyperlipidemia    Hypothyroid    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  Under fluoroscopy, transbronchial /notes 07/04/2010   CONDYLOMA EXCISION/FULGURATION  ~ 1996   anal/notes 07/04/2010    Family History  Problem Relation Age of Onset   Colon cancer Father     Leukemia Maternal Grandfather     Social History   Substance and Sexual Activity  Drug Use Yes   Types: Marijuana, Cocaine   Comment: 03/21/2017 "no cocaine for a few years; I smoke marijuana qd"  ,  Social History   Substance and Sexual Activity  Alcohol Use Yes   Comment: 03/21/2017 "none since 01/12/2017"  ,  Social History   Tobacco Use  Smoking Status Never  Smokeless Tobacco Never  ,  Social History   Substance and Sexual Activity  Sexual Activity Never   Partners: Female    Patient's Medications  New Prescriptions   PRAVASTATIN (PRAVACHOL) 40 MG TABLET    Take 1 tablet (40 mg total) by mouth daily.  Previous Medications   ALPRAZOLAM (XANAX) 0.5 MG TABLET    Take 1 tablet (0.5 mg total) by mouth 3 (three) times daily.   BIKTARVY 50-200-25 MG TABS TABLET    TAKE 1 TABLET BY MOUTH 1 TIME A DAY.   CETIRIZINE (ZYRTEC) 10 MG TABLET    TAKE 1 TABLET (10 MG TOTAL) BY MOUTH DAILY.   [facility] LANCETS ORIGINAL MISC    1 application by Does not apply route daily.   FLUCELVAX QUADRIVALENT 0.5 ML INJECTION    Inject 0.5 mLs into the muscle once.   GEMFIBROZIL (LOPID) 600 MG TABLET    TAKE 1 TABLET BY MOUTH TWICE A DAY   GLUCOSE BLOOD (ACCU-CHEK AVIVA) TEST STRIP    Use as instructed   HYDROCHLOROTHIAZIDE (MICROZIDE) 12.5 MG CAPSULE    TAKE 1 CAPSULE BY MOUTH EVERY DAY   LEVOTHYROXINE (SYNTHROID) 25 MCG TABLET    TAKE 1 TABLET BY MOUTH EVERY DAY ON AN EMPTY STOMACH   LISINOPRIL (ZESTRIL) 40 MG TABLET    TAKE 1 TABLET BY MOUTH EVERY DAY   METFORMIN (GLUCOPHAGE) 500 MG TABLET    TAKE 1/2 OF A TABLET BY MOUTH TWICE A DAY WITH MEALS   MISC NATURAL PRODUCTS (OSTEO BI-FLEX JOINT SHIELD PO)    Take 1 tablet by mouth daily.   MULTIPLE VITAMINS-MINERALS (ICAPS AREDS 2 PO)    Take by mouth.   ZINC GLUCONATE 50 MG TABLET    Take 50 mg by mouth daily.   Modified Medications   No medications on file  Discontinued Medications   PRAVASTATIN (PRAVACHOL) 20 MG TABLET    Take 2 tablets (40 mg  total) by mouth daily.    Sulfamethoxazole-trimethoprim  Review of Systems: General:   Denies fever, chills, unexplained weight loss.  Optho/Auditory:   Denies visual changes, blurred vision/LOV Respiratory:   Denies SOB, DOE more than baseline levels.   Cardiovascular:   Denies chest pain, palpitations, new onset peripheral edema  Gastrointestinal:   Denies nausea,  vomiting, diarrhea.  Genitourinary: Denies dysuria, freq/ urgency, flank pain  Endocrine:     Denies hot or cold intolerance, polyuria, polydipsia. Musculoskeletal:   Denies unexplained myalgias, joint swelling, unexplained arthralgias, gait problems.  Skin:  Denies rash, suspicious lesions Neurological:     Denies dizziness, unexplained weakness, numbness  Psychiatric/Behavioral:   Denies mood changes, suicidal or homicidal ideations, hallucinations    Objective:     Blood pressure 95/63, pulse 60, temperature 98.1 F (36.7 C), height  (1.727 m), weight 190 lb (86.2 kg), SpO2 98 %. Body mass index is 28.89 kg/m. General Appearance:    Alert, cooperative, no distress, appears stated age  Head:    Normocephalic, without obvious abnormality, atraumatic  Eyes:    PERRL, conjunctiva/corneas clear, EOM's intact, fundi    benign, both eyes  Ears:    Normal TM's and external ear canals, both ears  Nose:   Nares normal, septum midline, mucosa normal, no drainage    or sinus tenderness  Throat:   Lips w/o lesion, mucosa moist, and tongue normal; teeth and gums normal  Neck:   Supple, symmetrical, trachea midline, no adenopathy;    thyroid:  no enlargement/tenderness/nodules; no carotid   bruit or JVD  Back:     Symmetric, no curvature, ROM normal, no CVA tenderness  Lungs:     Clear to auscultation bilaterally, respirations unlabored, no Wh/ R/ R  Chest Wall:    No tenderness or gross deformity; normal excursion   Heart:    Regular rate and rhythm, S1 and S2 normal, no murmur, rub   or gallop  Abdomen:     Soft,  non-tender, bowel sounds active all four quadrants, No G/R/R, no masses, no organomegaly  Genitalia:    Deferred.  Rectal:    Deferred.  Extremities:   Extremities normal, atraumatic, no cyanosis or gross edema  Pulses:   2+ and symmetric all extremities  Skin:   Warm, dry, Skin color, texture, turgor normal, no obvious rashes or lesions  M-Sk:   Ambulates * 4 w/o difficulty, no gross deformities, tone WNL  Neurologic:   CNII-XII grossly intact Psych:  No HI/SI, judgement and insight good, Euthymic mood. Full Affect.

## 2020-09-23 NOTE — Patient Instructions (Signed)
Preventive Care 40-64 Years Old, Male Preventive care refers to lifestyle choices and visits with your health care provider that can promote health and wellness. This includes: A yearly physical exam. This is also called an annual wellness visit. Regular dental and eye exams. Immunizations. Screening for certain conditions. Healthy lifestyle choices, such as: Eating a healthy diet. Getting regular exercise. Not using drugs or products that contain nicotine and tobacco. Limiting alcohol use. What can I expect for my preventive care visit? Physical exam Your health care provider will check your: Height and weight. These may be used to calculate your BMI (body mass index). BMI is a measurement that tells if you are at a healthy weight. Heart rate and blood pressure. Body temperature. Skin for abnormal spots. Counseling Your health care provider may ask you questions about your: Past medical problems. Family's medical history. Alcohol, tobacco, and drug use. Emotional well-being. Home life and relationship well-being. Sexual activity. Diet, exercise, and sleep habits. Work and work environment. Access to firearms. What immunizations do I need?  Vaccines are usually given at various ages, according to a schedule. Your health care provider will recommend vaccines for you based on your age, medicalhistory, and lifestyle or other factors, such as travel or where you work. What tests do I need? Blood tests Lipid and cholesterol levels. These may be checked every 5 years, or more often if you are over 50 years old. Hepatitis C test. Hepatitis B test. Screening Lung cancer screening. You may have this screening every year starting at age 55 if you have a 30-pack-year history of smoking and currently smoke or have quit within the past 15 years. Prostate cancer screening. Recommendations will vary depending on your family history and other risks. Genital exam to check for testicular cancer  or hernias. Colorectal cancer screening. All adults should have this screening starting at age 50 and continuing until age 75. Your health care provider may recommend screening at age 45 if you are at increased risk. You will have tests every 1-10 years, depending on your results and the type of screening test. Diabetes screening. This is done by checking your blood sugar (glucose) after you have not eaten for a while (fasting). You may have this done every 1-3 years. STD (sexually transmitted disease) testing, if you are at risk. Follow these instructions at home: Eating and drinking  Eat a diet that includes fresh fruits and vegetables, whole grains, lean protein, and low-fat dairy products. Take vitamin and mineral supplements as recommended by your health care provider. Do not drink alcohol if your health care provider tells you not to drink. If you drink alcohol: Limit how much you have to 0-2 drinks a day. Be aware of how much alcohol is in your drink. In the [place] one drink equals one 12 oz bottle of beer (355 mL), one 5 oz glass of wine (148 mL), or one 1 oz glass of hard liquor (44 mL).  Lifestyle Take daily care of your teeth and gums. Brush your teeth every morning and night with fluoride toothpaste. Floss one time each day. Stay active. Exercise for at least 30 minutes 5 or more days each week. Do not use any products that contain nicotine or tobacco, such as cigarettes, e-cigarettes, and chewing tobacco. If you need help quitting, ask your health care provider. Do not use drugs. If you are sexually active, practice safe sex. Use a condom or other form of protection to prevent STIs (sexually transmitted infections). If told by   your health care provider, take low-dose aspirin daily starting at age 50. Find healthy ways to cope with stress, such as: Meditation, yoga, or listening to music. Journaling. Talking to a trusted person. Spending time with friends and  family. Safety Always wear your seat belt while driving or riding in a vehicle. Do not drive: If you have been drinking alcohol. Do not ride with someone who has been drinking. When you are tired or distracted. While texting. Wear a helmet and other protective equipment during sports activities. If you have firearms in your house, make sure you follow all gun safety procedures. What's next? Go to your health care provider once a year for an annual wellness visit. Ask your health care provider how often you should have your eyes and teeth checked. Stay up to date on all vaccines. This information is not intended to replace advice given to you by your health care provider. Make sure you discuss any questions you have with your healthcare provider. Document Revised: 11/18/2018 Document Reviewed: 02/13/2018 Elsevier Patient Education  2022 Elsevier Inc.  

## 2020-10-10 ENCOUNTER — Telehealth: Payer: Self-pay | Admitting: Physician Assistant

## 2020-10-10 DIAGNOSIS — E038 Other specified hypothyroidism: Secondary | ICD-10-CM

## 2020-10-10 MED ORDER — LEVOTHYROXINE SODIUM 25 MCG PO TABS: 25.0000 ug | ORAL_TABLET | Freq: Every day | ORAL | 0 refills | Status: DC

## 2020-10-10 NOTE — Addendum Note (Signed)
Addended by: Sylvester Harder on: 10/10/2020 12:37 PM   Modules accepted: Orders

## 2020-10-10 NOTE — Telephone Encounter (Signed)
Patient would like a refill on Synthroid and uses [facility] in [place]. Thanks

## 2020-10-25 ENCOUNTER — Other Ambulatory Visit: Payer: Self-pay | Admitting: Physician Assistant

## 2020-10-25 DIAGNOSIS — I152 Hypertension secondary to endocrine disorders: Secondary | ICD-10-CM

## 2020-10-25 DIAGNOSIS — E1159 Type 2 diabetes mellitus with other circulatory complications: Secondary | ICD-10-CM

## 2020-10-31 ENCOUNTER — Other Ambulatory Visit: Payer: Self-pay | Admitting: Physician Assistant

## 2020-10-31 DIAGNOSIS — E781 Pure hyperglyceridemia: Secondary | ICD-10-CM

## 2020-10-31 DIAGNOSIS — E1169 Type 2 diabetes mellitus with other specified complication: Secondary | ICD-10-CM

## 2020-11-12 ENCOUNTER — Telehealth: Payer: Self-pay

## 2020-11-12 NOTE — Telephone Encounter (Signed)
Lvm for pt to call the office to schedule either with me or in person AWV. Number to office was provided to do so. Dates for my availability were also given if pt wants to do a virtual.   Laurel Dimmer. RMA

## 2020-11-22 ENCOUNTER — Telehealth: Payer: Self-pay | Admitting: Physician Assistant

## 2020-11-22 NOTE — Telephone Encounter (Signed)
Left message for patient to call back and schedule Medicare Annual Wellness Visit (AWV).  No Hx of AWV eligible for AWVI as of 02/03/2011  Please schedule anytime with [facility].      Any questions, please call me at [phone]

## 2020-11-22 NOTE — Telephone Encounter (Signed)
Spoke  with patient about schedulingMedicare Annual Wellness Visit (AWV).  He did not feel secure making the appointment with me since the call did not show as coming from [facility].   I advised the patient to call the office and that they would be able to schedule for him.     No Hx of AWV eligible for AWVI as of 02/03/2011  Please schedule at any time with [facility].      Any questions, please call me at [phone]

## 2020-11-28 ENCOUNTER — Encounter: Payer: Medicare Other | Admitting: *Deleted

## 2020-12-05 ENCOUNTER — Other Ambulatory Visit: Payer: Self-pay

## 2020-12-05 ENCOUNTER — Encounter (INDEPENDENT_AMBULATORY_CARE_PROVIDER_SITE_OTHER): Payer: Self-pay | Admitting: *Deleted

## 2020-12-05 VITALS — BP 119/78 | HR 68 | Temp 98.1°F | Wt 193.5 lb

## 2020-12-05 DIAGNOSIS — Z006 Encounter for examination for normal comparison and control in clinical research program: Secondary | ICD-10-CM

## 2020-12-05 NOTE — Research (Signed)
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 No new complaints. He did get his flu vaccine and Covid booster last week. No changes in his current medications. He is scheduled to return in 2 weeks to see Dr. Daiva Eves. Next study visit scheduled for August.

## 2020-12-06 LAB — HEPATITIS C ANTIBODY
Hepatitis C Ab: NONREACTIVE
SIGNAL TO CUT-OFF: 0.03 (ref <1.00)

## 2020-12-06 LAB — CREATININE, SERUM: Creat: 1.23 mg/dL (ref 0.70–1.35)

## 2020-12-07 ENCOUNTER — Other Ambulatory Visit: Payer: Self-pay | Admitting: Physician Assistant

## 2020-12-07 DIAGNOSIS — E119 Type 2 diabetes mellitus without complications: Secondary | ICD-10-CM

## 2020-12-23 ENCOUNTER — Other Ambulatory Visit: Payer: Self-pay

## 2020-12-23 ENCOUNTER — Ambulatory Visit (INDEPENDENT_AMBULATORY_CARE_PROVIDER_SITE_OTHER): Payer: Medicare Other | Admitting: Infectious Disease

## 2020-12-23 ENCOUNTER — Encounter: Payer: Self-pay | Admitting: Infectious Disease

## 2020-12-23 VITALS — BP 107/73 | HR 73 | Temp 98.1°F | Ht 68.0 in | Wt 194.0 lb

## 2020-12-23 DIAGNOSIS — I2510 Atherosclerotic heart disease of native coronary artery without angina pectoris: Secondary | ICD-10-CM

## 2020-12-23 DIAGNOSIS — E782 Mixed hyperlipidemia: Secondary | ICD-10-CM

## 2020-12-23 DIAGNOSIS — E038 Other specified hypothyroidism: Secondary | ICD-10-CM

## 2020-12-23 DIAGNOSIS — B20 Human immunodeficiency virus [HIV] disease: Secondary | ICD-10-CM | POA: Diagnosis not present

## 2020-12-23 DIAGNOSIS — E1169 Type 2 diabetes mellitus with other specified complication: Secondary | ICD-10-CM | POA: Diagnosis not present

## 2020-12-23 DIAGNOSIS — I152 Hypertension secondary to endocrine disorders: Secondary | ICD-10-CM

## 2020-12-23 DIAGNOSIS — E1159 Type 2 diabetes mellitus with other circulatory complications: Secondary | ICD-10-CM

## 2020-12-23 DIAGNOSIS — E1121 Type 2 diabetes mellitus with diabetic nephropathy: Secondary | ICD-10-CM | POA: Diagnosis not present

## 2020-12-23 DIAGNOSIS — E119 Type 2 diabetes mellitus without complications: Secondary | ICD-10-CM | POA: Diagnosis not present

## 2020-12-23 MED ORDER — BIKTARVY 50-200-25 MG PO TABS: ORAL_TABLET | ORAL | 11 refills | Status: DC

## 2020-12-23 NOTE — Progress Notes (Signed)
Chief complaint: Follow-up for HIV on medications Subjective:    Patient ID: Jonathan Estes, male    DOB: 01/31/1956, 65 y.o.   MRN: [id number]  HPI  Jonathan Estes is a 65 year-old Caucasian man living with HIV also with comorbid diabetes mellitus coronary artery disease hyperlipidemia and hypothyroidism.  He has been perfectly controlled while taking Biktarvy with virological suppression and healthy immune status.  He has PCP locally  Delene Loll, PA.  His viral load remains undetectable with labs again drawn with research.  CD4 count is healthy as well.      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  Under fluoroscopy, transbronchial /notes 07/04/2010   CONDYLOMA EXCISION/FULGURATION  ~ 1996   anal/notes 07/04/2010    Family History  Problem Relation Age of Onset   Colon cancer Father    Leukemia Maternal Grandfather       Social History   Socioeconomic History   Marital status: Single    Spouse name: Not on file   Number of children: Not on file   Years of education: Not on file   Highest education level: Not on file  Occupational History   Not on file  Tobacco Use   Smoking status: Never   Smokeless tobacco: Never  Vaping Use   Vaping Use: Never used   Substance and Sexual Activity   Alcohol use: Yes    Comment: 03/21/2017 "none since 01/12/2017"   Drug use: Yes    Types: Marijuana, Cocaine    Comment: 03/21/2017 "no cocaine for a few years; I smoke marijuana qd"   Sexual activity: Never    Partners: Female  Other Topics Concern   Not on file  Social History Narrative   Lives alone.         Social Determinants of Health   Financial Resource Strain: Not on file  Food Insecurity: Not on file  Transportation Needs: Not on file  Physical Activity: Not on file  Stress: Not on file  Social Connections: Not on file    Allergies  Allergen Reactions   Sulfamethoxazole-Trimethoprim     REACTION: severe rash     Current Outpatient Medications:    ALPRAZolam (XANAX) 0.5 MG tablet, Take 1 tablet (0.5 mg total) by mouth 3 (three) times daily., Disp: 90 tablet, Rfl: 5   BIKTARVY 50-200-25 MG TABS tablet, TAKE 1 TABLET BY MOUTH 1 TIME A DAY., Disp: 30 tablet, Rfl: 4   cetirizine (ZYRTEC) 10 MG tablet, TAKE 1 TABLET (10 MG TOTAL) BY MOUTH DAILY., Disp: 90 tablet, Rfl: 2   gemfibrozil (LOPID) 600 MG tablet, TAKE 1 TABLET BY MOUTH TWICE A DAY, Disp: 180 tablet, Rfl: 0   hydrochlorothiazide (MICROZIDE) 12.5 MG capsule, TAKE 1 CAPSULE BY  MOUTH EVERY DAY, Disp: 90 capsule, Rfl: 1   levothyroxine (SYNTHROID) 25 MCG tablet, Take 1 tablet (25 mcg total) by mouth daily before breakfast., Disp: 90 tablet, Rfl: 0   lisinopril (ZESTRIL) 40 MG tablet, TAKE 1 TABLET BY MOUTH EVERY DAY, Disp: 90 tablet, Rfl: 0   metFORMIN (GLUCOPHAGE) 500 MG tablet, TAKE 1/2 OF A TABLET BY MOUTH TWICE A DAY WITH MEALS, Disp: 90 tablet, Rfl: 0   Misc Natural Products (OSTEO BI-FLEX JOINT SHIELD PO), Take 1 tablet by mouth daily., Disp: , Rfl:    Multiple Vitamins-Minerals (ICAPS AREDS 2 PO), Take by mouth., Disp: , Rfl:    pravastatin (PRAVACHOL) 40 MG tablet, Take 1 tablet (40 mg total) by mouth daily., Disp: 90 tablet, Rfl: 1   [employer] LANCETS ORIGINAL MISC, 1 application  by Does not apply route daily. (Patient not taking: Reported on 12/23/2020), Disp: 90 each, Rfl: 4   FLUCELVAX QUADRIVALENT 0.5 ML injection, Inject 0.5 mLs into the muscle once., Disp: , Rfl:    glucose blood (ACCU-CHEK AVIVA) test strip, Use as instructed (Patient not taking: Reported on 12/23/2020), Disp: 100 each, Rfl: 12   zinc gluconate 50 MG tablet, Take 50 mg by mouth daily.  (Patient not taking: Reported on 12/23/2020), Disp: , Rfl:   Review of Systems  Constitutional:  Negative for activity change, appetite change, chills, diaphoresis, fatigue, fever and unexpected weight change.  HENT:  Negative for congestion, rhinorrhea, sinus pressure, sneezing, sore throat and trouble swallowing.   Eyes:  Negative for photophobia and visual disturbance.  Respiratory:  Negative for cough, chest tightness, shortness of breath, wheezing and stridor.   Cardiovascular:  Negative for chest pain, palpitations and leg swelling.  Gastrointestinal:  Negative for abdominal distention, abdominal pain, anal bleeding, blood in stool, constipation, diarrhea, nausea and vomiting.  Genitourinary:  Negative for difficulty urinating, dysuria, flank pain and hematuria.  Musculoskeletal:  Negative for arthralgias, back pain, gait problem, joint swelling and myalgias.  Skin:  Negative for color change, pallor, rash and wound.  Neurological:  Negative for dizziness, tremors, weakness and light-headedness.  Hematological:  Negative for adenopathy. Does not bruise/bleed easily.  Psychiatric/Behavioral:  Negative for agitation, behavioral problems, confusion, decreased concentration, dysphoric mood and sleep disturbance.       Objective:   Physical Exam Constitutional:      Appearance: He is well-developed.  HENT:     Head: Normocephalic and atraumatic.  Eyes:     Conjunctiva/sclera: Conjunctivae normal.  Cardiovascular:     Rate and Rhythm: Normal rate and regular rhythm.  Pulmonary:     Effort: Pulmonary effort  is normal. No respiratory distress.     Breath sounds: No wheezing.  Abdominal:     General: There is no distension.     Palpations: Abdomen is soft.  Musculoskeletal:        General: No tenderness. Normal range of motion.     Cervical back: Normal range of motion and neck supple.  Skin:    General: Skin is warm and dry.     Coloration: Skin is not pale.     Findings: No erythema or rash.  Neurological:     General: No focal deficit present.     Mental Status: He is alert and oriented to person, place, and time.  Psychiatric:        Mood and Affect: Mood normal.        Behavior: Behavior normal.        Thought Content: Thought content  normal.        Judgment: Judgment normal.          Assessment & Plan:  HIV disease: I reviewed his viral load drawn with research which was less than 40 and not detected and CD4 count which was 864 on labs drawn in September of 2022  Continue his BIKTARVY   Vaccine counseling: Received updated COVID-19 vaccine and flu shot.  Diabetes mellitus followed by PCP and on metformin  Hypothyroidism: on syntrhoid  HTN: He is continuing hydrochlorothiazide and lisinopril and BP is well controlled  Vitals:   12/23/20 0914  BP: 107/73  Pulse: 73  Temp: 98.1 F (36.7 C)  SpO2: 99%   Hyperlipidemia: on pravachold and lopid

## 2020-12-26 ENCOUNTER — Other Ambulatory Visit: Payer: Self-pay | Admitting: Physician Assistant

## 2020-12-26 ENCOUNTER — Ambulatory Visit (INDEPENDENT_AMBULATORY_CARE_PROVIDER_SITE_OTHER): Payer: Medicare Other | Admitting: Physician Assistant

## 2020-12-26 ENCOUNTER — Other Ambulatory Visit: Payer: Self-pay

## 2020-12-26 ENCOUNTER — Encounter: Payer: Self-pay | Admitting: Physician Assistant

## 2020-12-26 VITALS — BP 114/71 | HR 63 | Temp 98.2°F | Ht 68.0 in | Wt 195.0 lb

## 2020-12-26 DIAGNOSIS — E039 Hypothyroidism, unspecified: Secondary | ICD-10-CM

## 2020-12-26 DIAGNOSIS — E559 Vitamin D deficiency, unspecified: Secondary | ICD-10-CM

## 2020-12-26 DIAGNOSIS — E038 Other specified hypothyroidism: Secondary | ICD-10-CM

## 2020-12-26 DIAGNOSIS — E782 Mixed hyperlipidemia: Secondary | ICD-10-CM | POA: Diagnosis not present

## 2020-12-26 DIAGNOSIS — E1169 Type 2 diabetes mellitus with other specified complication: Secondary | ICD-10-CM | POA: Diagnosis not present

## 2020-12-26 DIAGNOSIS — E1159 Type 2 diabetes mellitus with other circulatory complications: Secondary | ICD-10-CM

## 2020-12-26 DIAGNOSIS — I152 Hypertension secondary to endocrine disorders: Secondary | ICD-10-CM

## 2020-12-26 LAB — POCT GLYCOSYLATED HEMOGLOBIN (HGB A1C): Hemoglobin A1C: 5.9 % — AB (ref 4.0–5.6)

## 2020-12-26 NOTE — Assessment & Plan Note (Signed)
-  Last lipid panel, LDL 51. -Continue current medication regimen. -Will repeat lipid panel and hepatic function today. -Will continue to monitor.

## 2020-12-26 NOTE — Progress Notes (Signed)
Established Patient Office Visit  Subjective:  Patient ID: Jonathan Estes, male    DOB: 01-22-1956  Age: 65 y.o. MRN: [id number]  CC:  Chief Complaint  Patient presents with   Follow-up   Diabetes   Hypertension    HPI Jonathan Estes presents for follow up on diabetes mellitus, hypertension and hyperlipidemia. States obtained influenza vaccine at the pharmacy.  Diabetes mellitus: Pt denies increased urination or thirst. Pt reports medication compliance. No hypoglycemic events. Does not check glucose at home. Reports has reduced sugar intake especially cereal.    HTN: Pt denies chest pain, palpitations, dizziness or lower extremity swelling. Taking medication as directed without side effects. Pt tries to follow a low salt diet.  HLD: Pt taking medication as directed without issues. Tries to limit eating out.  Hypothyroid: reports medication compliance. Denies palpitations, heat/cold intolerance, or fatigue.    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  Under fluoroscopy, transbronchial /notes 07/04/2010   CONDYLOMA EXCISION/FULGURATION  ~ 1996   anal/notes 07/04/2010    Family History  Problem Relation Age of Onset   Colon cancer Father    Leukemia Maternal  Grandfather     Social History   Socioeconomic History   Marital status: Single    Spouse name: Not on file   Number of children: Not on file   Years of education: Not on file   Highest education level: Not on file  Occupational History   Not on file  Tobacco Use   Smoking status: Never   Smokeless tobacco: Never  Vaping Use   Vaping Use: Never used  Substance and Sexual Activity   Alcohol use: Yes    Comment: 03/21/2017 "none since 01/12/2017"   Drug use: Yes    Types: Marijuana, Cocaine    Comment: 03/21/2017 "no cocaine for a few years; I smoke marijuana qd"   Sexual activity: Never    Partners: Female  Other Topics Concern   Not on file  Social History Narrative   Lives alone.         Social Determinants of Health   Financial Resource Strain: Not on file  Food Insecurity: Not on file  Transportation Needs: Not on file  Physical Activity: Not on file  Stress: Not on file  Social Connections: Not on file  Intimate Partner Violence: Not on file    Outpatient Medications Prior to Visit  Medication Sig Dispense Refill   ALPRAZolam (XANAX) 0.5 MG tablet Take 1 tablet (0.5 mg total) by mouth 3 (three) times daily. 90 tablet 5   bictegravir-emtricitabine-tenofovir AF (BIKTARVY) 50-200-25 MG TABS tablet TAKE 1 TABLET BY MOUTH 1 TIME A DAY. 30 tablet 11   cetirizine (ZYRTEC) 10 MG tablet TAKE 1  TABLET (10 MG TOTAL) BY MOUTH DAILY. 90 tablet 2   [employer] LANCETS ORIGINAL MISC 1 application by Does not apply route daily. (Patient not taking: Reported on 12/23/2020) 90 each 4   FLUCELVAX QUADRIVALENT 0.5 ML injection Inject 0.5 mLs into the muscle once.     gemfibrozil (LOPID) 600 MG tablet TAKE 1 TABLET BY MOUTH TWICE A DAY 180 tablet 0   glucose blood (ACCU-CHEK AVIVA) test strip Use as instructed (Patient not taking: Reported on 12/23/2020) 100 each 12   hydrochlorothiazide (MICROZIDE) 12.5 MG capsule TAKE 1 CAPSULE BY MOUTH EVERY DAY 90 capsule 1   levothyroxine (SYNTHROID)  25 MCG tablet TAKE 1 TABLET BY MOUTH DAILY BEFORE BREAKFAST. 90 tablet 1   lisinopril (ZESTRIL) 40 MG tablet TAKE 1 TABLET BY MOUTH EVERY DAY 90 tablet 0   metFORMIN (GLUCOPHAGE) 500 MG tablet TAKE 1/2 OF A TABLET BY MOUTH TWICE A DAY WITH MEALS 90 tablet 0   Misc Natural Products (OSTEO BI-FLEX JOINT SHIELD PO) Take 1 tablet by mouth daily.     Multiple Vitamins-Minerals (ICAPS AREDS 2 PO) Take by mouth.     pravastatin (PRAVACHOL) 40 MG tablet Take 1 tablet (40 mg total) by mouth daily. 90 tablet 1   zinc gluconate 50 MG tablet Take 50 mg by mouth daily.  (Patient not taking: Reported on 12/23/2020)     No facility-administered medications prior to visit.    Allergies  Allergen Reactions   Sulfamethoxazole-Trimethoprim     REACTION: severe rash    ROS Review of Systems Review of Systems:  A fourteen system review of systems was performed and found to be positive as per HPI.  Objective:    Physical Exam General:  Well Developed, well nourished, appropriate for stated age.  Neuro:  Alert and oriented,  extra-ocular muscles intact  HEENT:  Normocephalic, atraumatic, neck supple  Skin:  no gross rash, warm, pink. Cardiac:  RRR, S1 S2 Respiratory: CTA B/L w/o wheezing, crackles, or rales, Not using accessory muscles, speaking in full sentences- unlabored. Vascular:  Ext warm, no cyanosis apprec.; cap RF less 2 sec. Psych:  No HI/SI, judgement and insight good, Euthymic mood. Full Affect.  BP 114/71   Pulse 63   Temp 98.2 F (36.8 C)   Ht '5\' 8"'  (1.727 m)   Wt 195 lb (88.5 kg)   BMI 29.65 kg/m  Wt Readings from Last 3 Encounters:  12/26/20 195 lb (88.5 kg)  12/23/20 194 lb (88 kg)  12/05/20 193 lb 8 oz (87.8 kg)     Health Maintenance Due  Topic Date Due   COLONOSCOPY (Pts 45-4yr Insurance coverage will need to be confirmed)  05/22/2017   OPHTHALMOLOGY EXAM  11/29/2017   COVID-19 Vaccine (5 - Booster for Pfizer series) 06/08/2020   INFLUENZA VACCINE  10/03/2020     There are no preventive care reminders to display for this patient.  Lab Results  Component Value Date   TSH 3.330 07/11/2020   Lab Results  Component Value Date   WBC 7.0 07/11/2020   HGB 14.6 07/11/2020   HCT 42.7 07/11/2020   MCV 95 07/11/2020   PLT 264 07/11/2020   Lab Results  Component Value Date   NA 140 07/11/2020   K 4.6 07/11/2020   CO2 26 07/11/2020   GLUCOSE 98 07/11/2020   BUN 20 07/11/2020   CREATININE 1.23 12/05/2020   BILITOT 0.2 07/11/2020   ALKPHOS 109 07/11/2020   AST 12 07/11/2020   ALT 7 07/11/2020  PROT 7.5 07/11/2020   ALBUMIN 4.8 07/11/2020   CALCIUM 9.7 07/11/2020   ANIONGAP 13 03/21/2017   EGFR 64 07/11/2020   Lab Results  Component Value Date   CHOL 119 07/11/2020   Lab Results  Component Value Date   HDL 51 07/11/2020   Lab Results  Component Value Date   LDLCALC 51 07/11/2020   Lab Results  Component Value Date   TRIG 87 07/11/2020   Lab Results  Component Value Date   CHOLHDL 2.3 07/11/2020   Lab Results  Component Value Date   HGBA1C 5.9 (A) 12/26/2020      Assessment & Plan:   Problem List Items Addressed This Visit       Cardiovascular and Mediastinum   Hypertension associated with diabetes ([place]) (Chronic)    -Controlled. -Continue current medication regimen. Will collect CMP for medication monitoring. -Will continue to monitor.      Relevant Orders   Comp Met (CMET)   CBC w/Diff     Endocrine   Hypothyroidism (Chronic)    -Last TSH wnl -Continue current medication regimen. -Rechecking thyroid labs today. Pending results will make medication adjustments if indicated.       Relevant Orders   TSH   T4, free   Mixed diabetic hyperlipidemia associated with type 2 diabetes mellitus (HCC) (Chronic)    -Last lipid panel, LDL 51. -Continue current medication regimen. -Will repeat lipid panel and hepatic function today. -Will continue to monitor.      Relevant Orders   Direct LDL   Diabetes  mellitus type II, controlled ([employer]) - Primary    -A1c today is 5.9, improved form 6.4 -Continue current medication regimen.  -Recommend to monitor for hypoglycemic events. -Continue with reduction of sugar intake. Monitor carbohydrates. -Will continue to monitor.      Relevant Orders   Comp Met (CMET)   CBC w/Diff   POCT glycosylated hemoglobin (Hb A1C) (Completed)     Other   Vitamin D deficiency   Relevant Orders   Vitamin D (25 hydroxy)   Vitamin d deficiency: -Last Vitamin D 67.5, will repeat Vitamin D today.   No orders of the defined types were placed in this encounter.   Follow-up: Return in about 4 months (around 04/28/2021) for DM, HTN, HLD.    Lorrene Reid, PA-C

## 2020-12-26 NOTE — Assessment & Plan Note (Signed)
-  Last TSH wnl -Continue current medication regimen. -Rechecking thyroid labs today. Pending results will make medication adjustments if indicated.  

## 2020-12-26 NOTE — Assessment & Plan Note (Signed)
-  Controlled. -Continue current medication regimen. -Will collect CMP for medication monitoring. -Will continue to monitor. 

## 2020-12-26 NOTE — Assessment & Plan Note (Signed)
-  A1c today is 5.9, improved form 6.4 -Continue current medication regimen.  -Recommend to monitor for hypoglycemic events. -Continue with reduction of sugar intake. Monitor carbohydrates. -Will continue to monitor.

## 2020-12-26 NOTE — Patient Instructions (Signed)
Diabetes Mellitus and Nutrition, Adult When you have diabetes, or diabetes mellitus, it is very important to have healthy eating habits because your blood sugar (glucose) levels are greatly affected by what you eat and drink. Eating healthy foods in the right amounts, at about the same times every day, can help you:  Control your blood glucose.  Lower your risk of heart disease.  Improve your blood pressure.  Reach or maintain a healthy weight. What can affect my meal plan? Every person with diabetes is different, and each person has different needs for a meal plan. Your health care provider may recommend that you work with a dietitian to make a meal plan that is best for you. Your meal plan may vary depending on factors such as:  The calories you need.  The medicines you take.  Your weight.  Your blood glucose, blood pressure, and cholesterol levels.  Your activity level.  Other health conditions you have, such as heart or kidney disease. How do carbohydrates affect me? Carbohydrates, also called carbs, affect your blood glucose level more than any other type of food. Eating carbs naturally raises the amount of glucose in your blood. Carb counting is a method for keeping track of how many carbs you eat. Counting carbs is important to keep your blood glucose at a healthy level, especially if you use insulin or take certain oral diabetes medicines. It is important to know how many carbs you can safely have in each meal. This is different for every person. Your dietitian can help you calculate how many carbs you should have at each meal and for each snack. How does alcohol affect me? Alcohol can cause a sudden decrease in blood glucose (hypoglycemia), especially if you use insulin or take certain oral diabetes medicines. Hypoglycemia can be a life-threatening condition. Symptoms of hypoglycemia, such as sleepiness, dizziness, and confusion, are similar to symptoms of having too much  alcohol.  Do not drink alcohol if: ? Your health care provider tells you not to drink. ? You are pregnant, may be pregnant, or are planning to become pregnant.  If you drink alcohol: ? Do not drink on an empty stomach. ? Limit how much you use to:  0-1 drink a day for women.  0-2 drinks a day for men. ? Be aware of how much alcohol is in your drink. In the [place] one drink equals one 12 oz bottle of beer (355 mL), one 5 oz glass of wine (148 mL), or one 1 oz glass of hard liquor (44 mL). ? Keep yourself hydrated with water, diet soda, or unsweetened iced tea.  Keep in mind that regular soda, juice, and other mixers may contain a lot of sugar and must be counted as carbs. What are tips for following this plan? Reading food labels  Start by checking the serving size on the "Nutrition Facts" label of packaged foods and drinks. The amount of calories, carbs, fats, and other nutrients listed on the label is based on one serving of the item. Many items contain more than one serving per package.  Check the total grams (g) of carbs in one serving. You can calculate the number of servings of carbs in one serving by dividing the total carbs by 15. For example, if a food has 30 g of total carbs per serving, it would be equal to 2 servings of carbs.  Check the number of grams (g) of saturated fats and trans fats in one serving. Choose foods that have   a low amount or none of these fats.  Check the number of milligrams (mg) of salt (sodium) in one serving. Most people should limit total sodium intake to less than 2,300 mg per day.  Always check the nutrition information of foods labeled as "low-fat" or "nonfat." These foods may be higher in added sugar or refined carbs and should be avoided.  Talk to your dietitian to identify your daily goals for nutrients listed on the label. Shopping  Avoid buying canned, pre-made, or processed foods. These foods tend to be high in fat, sodium, and added  sugar.  Shop around the outside edge of the grocery store. This is where you will most often find fresh fruits and vegetables, bulk grains, fresh meats, and fresh dairy. Cooking  Use low-heat cooking methods, such as baking, instead of high-heat cooking methods like deep frying.  Cook using healthy oils, such as olive, canola, or sunflower oil.  Avoid cooking with butter, cream, or high-fat meats. Meal planning  Eat meals and snacks regularly, preferably at the same times every day. Avoid going long periods of time without eating.  Eat foods that are high in fiber, such as fresh fruits, vegetables, beans, and whole grains. Talk with your dietitian about how many servings of carbs you can eat at each meal.  Eat 4-6 oz (112-168 g) of lean protein each day, such as lean meat, chicken, fish, eggs, or tofu. One ounce (oz) of lean protein is equal to: ? 1 oz (28 g) of meat, chicken, or fish. ? 1 egg. ?  cup (62 g) of tofu.  Eat some foods each day that contain healthy fats, such as avocado, nuts, seeds, and fish.   What foods should I eat? Fruits Berries. Apples. Oranges. Peaches. Apricots. Plums. Grapes. Mango. Papaya. Pomegranate. Kiwi. Cherries. Vegetables Lettuce. Spinach. Leafy greens, including kale, chard, collard greens, and mustard greens. Beets. Cauliflower. Cabbage. Broccoli. Carrots. Green beans. Tomatoes. Peppers. Onions. Cucumbers. Brussels sprouts. Grains Whole grains, such as whole-wheat or whole-grain bread, crackers, tortillas, cereal, and pasta. Unsweetened oatmeal. Quinoa. Brown or wild rice. Meats and other proteins Seafood. Poultry without skin. Lean cuts of poultry and beef. Tofu. Nuts. Seeds. Dairy Low-fat or fat-free dairy products such as milk, yogurt, and cheese. The items listed above may not be a complete list of foods and beverages you can eat. Contact a dietitian for more information. What foods should I avoid? Fruits Fruits canned with  syrup. Vegetables Canned vegetables. Frozen vegetables with butter or cream sauce. Grains Refined white flour and flour products such as bread, pasta, snack foods, and cereals. Avoid all processed foods. Meats and other proteins Fatty cuts of meat. Poultry with skin. Breaded or fried meats. Processed meat. Avoid saturated fats. Dairy Full-fat yogurt, cheese, or milk. Beverages Sweetened drinks, such as soda or iced tea. The items listed above may not be a complete list of foods and beverages you should avoid. Contact a dietitian for more information. Questions to ask a health care provider  Do I need to meet with a diabetes educator?  Do I need to meet with a dietitian?  What number can I call if I have questions?  When are the best times to check my blood glucose? Where to find more information:  American Diabetes Association: diabetes.org  Academy of Nutrition and Dietetics: www.eatright.org  National Institute of Diabetes and Digestive and Kidney Diseases: www.niddk.nih.gov  Association of Diabetes Care and Education Specialists: www.diabeteseducator.org Summary  It is important to have healthy eating   habits because your blood sugar (glucose) levels are greatly affected by what you eat and drink.  A healthy meal plan will help you control your blood glucose and maintain a healthy lifestyle.  Your health care provider may recommend that you work with a dietitian to make a meal plan that is best for you.  Keep in mind that carbohydrates (carbs) and alcohol have immediate effects on your blood glucose levels. It is important to count carbs and to use alcohol carefully. This information is not intended to replace advice given to you by your health care provider. Make sure you discuss any questions you have with your health care provider. Document Revised: 01/27/2019 Document Reviewed: 01/27/2019 Elsevier Patient Education  2021 Elsevier Inc.  

## 2020-12-27 LAB — CBC WITH DIFFERENTIAL/PLATELET
Basophils Absolute: 0.1 10*3/uL (ref 0.0–0.2)
Basos: 1 %
EOS (ABSOLUTE): 0.3 10*3/uL (ref 0.0–0.4)
Eos: 4 %
Hematocrit: 40.8 % (ref 37.5–51.0)
Hemoglobin: 14.7 g/dL (ref 13.0–17.7)
Immature Grans (Abs): 0 10*3/uL (ref 0.0–0.1)
Immature Granulocytes: 1 %
Lymphocytes Absolute: 2.5 10*3/uL (ref 0.7–3.1)
Lymphs: 29 %
MCH: 33.3 pg — ABNORMAL HIGH (ref 26.6–33.0)
MCHC: 36 g/dL — ABNORMAL HIGH (ref 31.5–35.7)
MCV: 93 fL (ref 79–97)
Monocytes Absolute: 0.8 10*3/uL (ref 0.1–0.9)
Monocytes: 9 %
Neutrophils Absolute: 4.7 10*3/uL (ref 1.4–7.0)
Neutrophils: 56 %
Platelets: 273 10*3/uL (ref 150–450)
RBC: 4.41 x10E6/uL (ref 4.14–5.80)
RDW: 11.8 % (ref 11.6–15.4)
WBC: 8.4 10*3/uL (ref 3.4–10.8)

## 2020-12-27 LAB — COMPREHENSIVE METABOLIC PANEL
ALT: 5 IU/L (ref 0–44)
AST: 14 IU/L (ref 0–40)
Albumin/Globulin Ratio: 1.8 (ref 1.2–2.2)
Albumin: 4.7 g/dL (ref 3.8–4.8)
Alkaline Phosphatase: 93 IU/L (ref 44–121)
BUN/Creatinine Ratio: 14 (ref 10–24)
BUN: 17 mg/dL (ref 8–27)
Bilirubin Total: 0.3 mg/dL (ref 0.0–1.2)
CO2: 22 mmol/L (ref 20–29)
Calcium: 9.6 mg/dL (ref 8.6–10.2)
Chloride: 98 mmol/L (ref 96–106)
Creatinine, Ser: 1.21 mg/dL (ref 0.76–1.27)
Globulin, Total: 2.6 g/dL (ref 1.5–4.5)
Glucose: 101 mg/dL — ABNORMAL HIGH (ref 70–99)
Potassium: 4.5 mmol/L (ref 3.5–5.2)
Sodium: 138 mmol/L (ref 134–144)
Total Protein: 7.3 g/dL (ref 6.0–8.5)
eGFR: 68 mL/min/{1.73_m2} (ref >59)

## 2020-12-27 LAB — TSH: TSH: 1.76 u[IU]/mL (ref 0.450–4.500)

## 2020-12-27 LAB — VITAMIN D 25 HYDROXY (VIT D DEFICIENCY, FRACTURES): Vit D, 25-Hydroxy: 71.1 ng/mL (ref 30.0–100.0)

## 2020-12-27 LAB — T4, FREE: Free T4: 1.36 ng/dL (ref 0.82–1.77)

## 2020-12-27 LAB — LDL CHOLESTEROL, DIRECT: LDL Direct: 63 mg/dL (ref 0–99)

## 2021-01-10 ENCOUNTER — Other Ambulatory Visit: Payer: Self-pay | Admitting: Physician Assistant

## 2021-01-10 ENCOUNTER — Ambulatory Visit: Payer: Medicare Other | Admitting: Infectious Disease

## 2021-01-10 DIAGNOSIS — I152 Hypertension secondary to endocrine disorders: Secondary | ICD-10-CM

## 2021-01-10 DIAGNOSIS — E1159 Type 2 diabetes mellitus with other circulatory complications: Secondary | ICD-10-CM

## 2021-01-16 ENCOUNTER — Telehealth: Payer: Self-pay | Admitting: Physician Assistant

## 2021-01-16 ENCOUNTER — Other Ambulatory Visit: Payer: Self-pay | Admitting: Family

## 2021-01-16 DIAGNOSIS — F419 Anxiety disorder, unspecified: Secondary | ICD-10-CM

## 2021-01-16 NOTE — Telephone Encounter (Signed)
Patient called into office to get a refill on his Xanax.  Patient is stating he called Dr.  Daiva Eves and per his office, he will no longer prescribe Xanax.  He was told Dr. Daiva Eves will only prescribe medication related to Infectious Disease.  He was told to call his PCP for [place] to refill the medication.  Please Advise!   ALPRAZolam (XANAX) 0.5 MG tablet [[id number]]    Order Details Dose: 0.5 mg Route: Oral Frequency: 3 times daily  Dispense Quantity: 90 tablet Refills: 5   Note to Pharmacy: Please send additional refill requests to Dr. Daiva Eves  Indications of Use: Anxiety       Sig: Take 1 tablet (0.5 mg total) by mouth 3 (three) times daily.       Start Date: 05/17/20 End Date: --  Written Date: 05/17/20 Expiration Date: 11/13/20     Diagnosis Association: Anxiety (F41.9)  Original Order:  ALPRAZolam Prudy Feeler) 0.5 MG tablet [[id number]]     Order Questions  Question Answer Comment  Supervising Provider Judyann Munson       Providers  Authorizing Provider:   Veryl Speak, FNP  [address], [place] [address]  Phone:  [phone]   Fax:  [phone]  DEA #:  [id number]   NPI:  [id number] Supervising Provider:   Judyann Munson, MD  [address], [place] [address]  Phone:  [phone]   Fax:  [phone]  DEA #:  [id number]   NPI:  [id number]

## 2021-01-16 NOTE — Telephone Encounter (Signed)
Patient notified to contact PCP for further refills; patient was unhappy and stated Dr. Daiva Eves has always refilled it. RN explained that practice is trying to move away from prescribing medications that aren't specialty related. Patient stated his PCP won't fill it; RN asked why and patient stated he would reach out to see. Patient stated he "needed to get off the phone before he got real mad" and he wasn't happy about the change.   Calliope Delangel Loyola Mast, RN

## 2021-01-16 NOTE — Telephone Encounter (Signed)
Please refill if appropriate, patient has PCP listed.

## 2021-01-17 NOTE — Telephone Encounter (Signed)
Patient calling our office requesting that we manage the Alprazolam that has been prescribed since 2012 by Infectious Disease.   Per Kandis Cocking and our office policy, we are unable to manage this medication at the dose and amount that patient is taking. We would only provide 2 -30 day supply a year. Patient offered a referral to Psychiatry and declined.    Dr. Algis Liming, patient is requesting you continue providing this medication for patient? Please advise.    Kind regards,  Jake Samples, CMA

## 2021-01-19 NOTE — Telephone Encounter (Signed)
Patient advised that this medication will only have two fills per year by PCP. If the patient needs further medication refills he will have to establish with Psychiatry to have the medication managed.

## 2021-01-27 ENCOUNTER — Other Ambulatory Visit: Payer: Self-pay | Admitting: Physician Assistant

## 2021-01-27 DIAGNOSIS — E781 Pure hyperglyceridemia: Secondary | ICD-10-CM

## 2021-01-27 DIAGNOSIS — E1169 Type 2 diabetes mellitus with other specified complication: Secondary | ICD-10-CM

## 2021-01-27 DIAGNOSIS — I152 Hypertension secondary to endocrine disorders: Secondary | ICD-10-CM

## 2021-02-28 ENCOUNTER — Other Ambulatory Visit: Payer: Self-pay | Admitting: Physician Assistant

## 2021-02-28 DIAGNOSIS — E119 Type 2 diabetes mellitus without complications: Secondary | ICD-10-CM

## 2021-03-04 ENCOUNTER — Other Ambulatory Visit: Payer: Self-pay | Admitting: Physician Assistant

## 2021-03-04 DIAGNOSIS — E1169 Type 2 diabetes mellitus with other specified complication: Secondary | ICD-10-CM

## 2021-05-02 ENCOUNTER — Other Ambulatory Visit: Payer: Self-pay

## 2021-05-02 ENCOUNTER — Ambulatory Visit (INDEPENDENT_AMBULATORY_CARE_PROVIDER_SITE_OTHER): Payer: Medicare Other | Admitting: Physician Assistant

## 2021-05-02 ENCOUNTER — Encounter: Payer: Self-pay | Admitting: Physician Assistant

## 2021-05-02 VITALS — BP 123/72 | HR 70 | Temp 97.4°F | Ht 68.0 in | Wt 200.0 lb

## 2021-05-02 DIAGNOSIS — I152 Hypertension secondary to endocrine disorders: Secondary | ICD-10-CM

## 2021-05-02 DIAGNOSIS — E1169 Type 2 diabetes mellitus with other specified complication: Secondary | ICD-10-CM | POA: Diagnosis not present

## 2021-05-02 DIAGNOSIS — E782 Mixed hyperlipidemia: Secondary | ICD-10-CM

## 2021-05-02 DIAGNOSIS — E1159 Type 2 diabetes mellitus with other circulatory complications: Secondary | ICD-10-CM

## 2021-05-02 LAB — POCT GLYCOSYLATED HEMOGLOBIN (HGB A1C): Hemoglobin A1C: 6.1 % — AB (ref 4.0–5.6)

## 2021-05-02 NOTE — Assessment & Plan Note (Signed)
-  Last direct LDL 63, at goal <70. -Continue current medication regimen. -Will continue to monitor.

## 2021-05-02 NOTE — Assessment & Plan Note (Signed)
-   BP today is at goal. - Continue current medication regimen. See med list - Follow DASH diet. - Encourage to stay as active as possible. - Will continue to monitor.

## 2021-05-02 NOTE — Progress Notes (Signed)
Established patient visit   Patient: Jonathan Estes   DOB: [birth year]   66 y.o. Male  MRN: [id number] Visit Date: 05/02/2021  Chief Complaint  Patient presents with   Diabetes   Hypertension   Subjective    HPI  Patient presents for follow-up on diabetes mellitus, hypertension and hyperlipidemia.  Diabetes: Pt denies increased urination or thirst. Pt reports medication compliance. No hypoglycemic events. Checking glucose at home. Reports has stopped eating cereal. States his sugars were running in the 150s-160s with cereal.   HTN: Pt denies chest pain, palpitations, dizziness or lower extremity swelling. Taking medication as directed without side effects. Has not checked BP at home lately.   HLD: Pt taking medication as directed without issues. Denies side effects including myalgias or muscle weakness.     Medications: Outpatient Medications Prior to Visit  Medication Sig   ALPRAZolam (XANAX) 0.5 MG tablet Take 1 tablet (0.5 mg total) by mouth 3 (three) times daily.   bictegravir-emtricitabine-tenofovir AF (BIKTARVY) 50-200-25 MG TABS tablet TAKE 1 TABLET BY MOUTH 1 TIME A DAY.   cetirizine (ZYRTEC) 10 MG tablet TAKE 1 TABLET (10 MG TOTAL) BY MOUTH DAILY.   [facility] LANCETS ORIGINAL MISC 1 application by Does not apply route daily.   FLUCELVAX QUADRIVALENT 0.5 ML injection Inject 0.5 mLs into the muscle once.   gemfibrozil (LOPID) 600 MG tablet TAKE 1 TABLET BY MOUTH TWICE A DAY   glucose blood (ACCU-CHEK AVIVA) test strip Use as instructed   hydrochlorothiazide (MICROZIDE) 12.5 MG capsule TAKE 1 CAPSULE BY MOUTH EVERY DAY   levothyroxine (SYNTHROID) 25 MCG tablet TAKE 1 TABLET BY MOUTH DAILY BEFORE BREAKFAST.   lisinopril (ZESTRIL) 40 MG tablet TAKE 1 TABLET BY MOUTH EVERY DAY   metFORMIN (GLUCOPHAGE) 500 MG tablet Take 0.5 tablets (250 mg total) by mouth 2 (two) times daily with a meal.   Misc Natural Products (OSTEO BI-FLEX JOINT SHIELD PO) Take 1 tablet by mouth daily.    Multiple Vitamins-Minerals (ICAPS AREDS 2 PO) Take by mouth.   pravastatin (PRAVACHOL) 40 MG tablet TAKE 1 TABLET BY MOUTH EVERY DAY   zinc gluconate 50 MG tablet Take 50 mg by mouth daily.   No facility-administered medications prior to visit.    Review of Systems Review of Systems:  A fourteen system review of systems was performed and found to be positive as per HPI.     Objective    BP 123/72    Pulse 70    Temp (!) 97.4 F (36.3 C)    Ht 5\' 8"  (1.727 m)    Wt 200 lb (90.7 kg)    SpO2 98%    BMI 30.41 kg/m  BP Readings from Last 3 Encounters:  05/02/21 123/72  12/26/20 114/71  12/23/20 107/73   Wt Readings from Last 3 Encounters:  05/02/21 200 lb (90.7 kg)  12/26/20 195 lb (88.5 kg)  12/23/20 194 lb (88 kg)    Physical Exam  General:  Pleasant and cooperative, in no acute distress. Neuro:  Alert and oriented,  extra-ocular muscles intact  HEENT:  Normocephalic, atraumatic, neck supple  Skin:  no gross rash, warm, pink. Cardiac:  RRR, S1 S2 Respiratory: CTA B/L  Vascular:  Ext warm, no cyanosis apprec.; cap RF less 2 sec. Psych:  No HI/SI, judgement and insight good, Euthymic mood. Full Affect.   Results for orders placed or performed in visit on 05/02/21  POCT glycosylated hemoglobin (Hb A1C)  Result Value Ref Range   Hemoglobin A1C  6.1 (A) 4.0 - 5.6 %   HbA1c POC (<> result, manual entry)     HbA1c, POC (prediabetic range)     HbA1c, POC (controlled diabetic range)      Assessment & Plan      Problem List Items Addressed This Visit       Cardiovascular and Mediastinum   Hypertension associated with diabetes ([place]) (Chronic)    - BP today is at goal. - Continue current medication regimen. See med list - Follow DASH diet. - Encourage to stay as active as possible. - Will continue to monitor.         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 -Continue  current medication regimen. -Will continue to monitor.      Diabetes mellitus type II, controlled ([place]) - Primary    -A1c today is 6.1, stable. -Continue current medication regimen. -Will continue to monitor.       Relevant Orders   POCT glycosylated hemoglobin (Hb A1C) (Completed)    Return in about 4 months (around 08/30/2021) for [place] and FBW.        Lorrene Reid, PA-C  [facility] [phone] (phone) [phone] (fax)  [place]

## 2021-05-02 NOTE — Patient Instructions (Signed)

## 2021-05-02 NOTE — Assessment & Plan Note (Addendum)
-  A1c today is 6.1, stable. -Continue current medication regimen. -Will continue to monitor.

## 2021-06-21 ENCOUNTER — Other Ambulatory Visit: Payer: Self-pay | Admitting: Physician Assistant

## 2021-06-21 DIAGNOSIS — E038 Other specified hypothyroidism: Secondary | ICD-10-CM

## 2021-08-07 ENCOUNTER — Other Ambulatory Visit: Payer: Self-pay | Admitting: Physician Assistant

## 2021-08-07 DIAGNOSIS — E1159 Type 2 diabetes mellitus with other circulatory complications: Secondary | ICD-10-CM

## 2021-08-24 ENCOUNTER — Other Ambulatory Visit: Payer: Self-pay | Admitting: Physician Assistant

## 2021-08-24 DIAGNOSIS — E1169 Type 2 diabetes mellitus with other specified complication: Secondary | ICD-10-CM

## 2021-08-24 DIAGNOSIS — E781 Pure hyperglyceridemia: Secondary | ICD-10-CM

## 2021-08-30 ENCOUNTER — Other Ambulatory Visit: Payer: Self-pay | Admitting: Physician Assistant

## 2021-08-30 DIAGNOSIS — E119 Type 2 diabetes mellitus without complications: Secondary | ICD-10-CM

## 2021-08-30 DIAGNOSIS — E1169 Type 2 diabetes mellitus with other specified complication: Secondary | ICD-10-CM

## 2021-09-01 ENCOUNTER — Ambulatory Visit (INDEPENDENT_AMBULATORY_CARE_PROVIDER_SITE_OTHER): Payer: Medicare Other | Admitting: Physician Assistant

## 2021-09-01 ENCOUNTER — Encounter: Payer: Self-pay | Admitting: Physician Assistant

## 2021-09-01 VITALS — Ht 68.0 in | Wt 200.0 lb

## 2021-09-01 DIAGNOSIS — Z0000 Encounter for general adult medical examination without abnormal findings: Secondary | ICD-10-CM | POA: Diagnosis not present

## 2021-09-01 DIAGNOSIS — I152 Hypertension secondary to endocrine disorders: Secondary | ICD-10-CM

## 2021-09-01 DIAGNOSIS — E039 Hypothyroidism, unspecified: Secondary | ICD-10-CM

## 2021-09-01 DIAGNOSIS — E782 Mixed hyperlipidemia: Secondary | ICD-10-CM | POA: Diagnosis not present

## 2021-09-01 DIAGNOSIS — E559 Vitamin D deficiency, unspecified: Secondary | ICD-10-CM

## 2021-09-01 DIAGNOSIS — E1159 Type 2 diabetes mellitus with other circulatory complications: Secondary | ICD-10-CM | POA: Diagnosis not present

## 2021-09-01 DIAGNOSIS — E1169 Type 2 diabetes mellitus with other specified complication: Secondary | ICD-10-CM

## 2021-09-01 NOTE — Patient Instructions (Signed)

## 2021-09-01 NOTE — Progress Notes (Signed)
Subjective:   Jonathan Estes is a 66 y.o. male who presents for Medicare Annual/Subsequent preventive examination.  Review of Systems    Refer to PCP   I connected with  Antoney B Gonyer on 09/01/21 by an audio only telemedicine application and verified that I am speaking with the correct person using two identifiers.   I discussed the limitations, risks, security and privacy concerns of performing an evaluation and management service by telephone and the availability of in person appointments. I also discussed with the patient that there may be a patient responsible charge related to this service. The patient expressed understanding and verbally consented to this telephonic visit.  Location of Patient: Home Location of Provider:Office  List any persons and their role that are participating in the visit with the patient.  Batul Diego, CMA     Objective:    Today's Vitals   09/01/21 0908  Weight: 200 lb (90.7 kg)  Height: 5\' 8"  (1.727 m)   Body mass index is 30.41 kg/m.     01/21/2018   11:10 AM 03/21/2017    5:03 PM 11/26/2016    8:19 AM 07/23/2016    2:47 PM  Advanced Directives  Does Patient Have a Medical Advance Directive? No No No No  Would patient like information on creating a medical advance directive? No - Patient declined No - Patient declined No - Patient declined No - Patient declined    Current Medications (verified) Outpatient Encounter Medications as of 09/01/2021  Medication Sig   cetirizine (ZYRTEC) 10 MG tablet TAKE 1 TABLET (10 MG TOTAL) BY MOUTH DAILY.   [facility] LANCETS ORIGINAL MISC 1 application by Does not apply route daily.   gemfibrozil (LOPID) 600 MG tablet TAKE 1 TABLET BY MOUTH TWICE A DAY   glucose blood (ACCU-CHEK AVIVA) test strip Use as instructed   hydrochlorothiazide (MICROZIDE) 12.5 MG capsule TAKE 1 CAPSULE BY MOUTH EVERY DAY   levothyroxine (SYNTHROID) 25 MCG tablet TAKE 1 TABLET BY MOUTH EVERY DAY BEFORE BREAKFAST   lisinopril  (ZESTRIL) 40 MG tablet TAKE 1 TABLET BY MOUTH EVERY DAY   metFORMIN (GLUCOPHAGE) 500 MG tablet TAKE 0.5 TABLETS (250 MG TOTAL) BY MOUTH 2 (TWO) TIMES DAILY WITH A MEAL.   Misc Natural Products (OSTEO BI-FLEX JOINT SHIELD PO) Take 1 tablet by mouth daily.   Multiple Vitamins-Minerals (ICAPS AREDS 2 PO) Take by mouth.   pravastatin (PRAVACHOL) 40 MG tablet TAKE 1 TABLET BY MOUTH EVERY DAY   zinc gluconate 50 MG tablet Take 50 mg by mouth daily.   ALPRAZolam (XANAX) 0.5 MG tablet Take 1 tablet (0.5 mg total) by mouth 3 (three) times daily. (Patient not taking: Reported on 09/01/2021)   bictegravir-emtricitabine-tenofovir AF (BIKTARVY) 50-200-25 MG TABS tablet TAKE 1 TABLET BY MOUTH 1 TIME A DAY.   [DISCONTINUED] FLUCELVAX QUADRIVALENT 0.5 ML injection Inject 0.5 mLs into the muscle once. (Patient not taking: Reported on 09/01/2021)   No facility-administered encounter medications on file as of 09/01/2021.    Allergies (verified) Sulfamethoxazole-trimethoprim   History: Past Medical History:  Diagnosis Date   AIDS (HCC) 07/12/2014   AKI (acute kidney injury) (HCC) 08/12/2017   Anxiety    Arthritis    "hips, shoulders" (03/21/2017)   Bilateral bunions    left foot worse   Coronary artery disease involving native coronary artery 01/12/2015   Diabetes mellitus type 2, controlled, without complications (HCC) 01/12/2015   Diabetes type 2, controlled (HCC) 07/12/2014   High triglycerides  HTN (hypertension)    Human immunodeficiency virus (HIV) disease (HCC) d'x 09/2007   Hyperlipidemia    Hypothyroid    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  Under fluoroscopy, transbronchial /notes 07/04/2010   CONDYLOMA EXCISION/FULGURATION  ~ 1996   anal/notes 07/04/2010   Family History  Problem Relation Age of Onset   Colon cancer  Father    Leukemia Maternal Grandfather    Social History   Socioeconomic History   Marital status: Single    Spouse name: Not on file   Number of children: Not on file   Years of education: Not on file   Highest education level: Not on file  Occupational History   Occupation: Retired  Tobacco Use   Smoking status: Never   Smokeless tobacco: Never  Vaping Use   Vaping Use: Never used  Substance and Sexual Activity   Alcohol use: Yes    Comment: 03/21/2017 "none since 01/12/2017"   Drug use: Yes    Types: Marijuana, Cocaine    Comment: 03/21/2017 "no cocaine for a few years; I smoke marijuana qd"   Sexual activity: Never    Partners: Female  Other Topics Concern   Not on file  Social History Narrative   Lives alone.         Social Determinants of Health   Financial Resource Strain: High Risk (09/01/2021)   Overall Financial Resource Strain (CARDIA)    Difficulty of Paying Living Expenses: Very hard  Food Insecurity: No Food Insecurity (09/01/2021)   Hunger Vital Sign    Worried About Running Out of Food in the Last Year: Never true    Ran Out of Food in the Last Year: Never true  Transportation Needs: No Transportation Needs (09/01/2021)   PRAPARE - Administrator, Civil Service (Medical): No    Lack of Transportation (Non-Medical): No  Physical Activity: Insufficiently Active (09/01/2021)   Exercise Vital Sign    Days of Exercise per Week: 2 days    Minutes of Exercise per Session: 30 min  Stress: No Stress Concern Present (09/01/2021)   [employer] of Occupational Health - Occupational Stress Questionnaire    Feeling of Stress : Not at all  Social Connections: Unknown (09/01/2021)   Social Connection and Isolation Panel [NHANES]    Frequency of Communication with Friends and Family: Patient refused    Frequency of Social Gatherings with Friends and Family: Once a week    Attends Religious Services: Never    Database administrator or Organizations:  No    Attends Banker Meetings: Never    Marital Status: Patient refused    Tobacco Counseling Counseling given: Not Answered   Clinical Intake:                 Diabetic?YES         Activities of Daily Living    09/01/2021    9:17 AM 05/02/2021    8:57 AM  In your present state of health, do you have any difficulty performing the following activities:  Hearing? 0 0  Vision? 0 0  Difficulty concentrating or making decisions? 0 0  Walking or climbing stairs? 0 0  Dressing or bathing? 0 0  Doing errands, shopping? 0 0    Patient Care Team: Mayer Masker, PA-C as PCP -  General Louis MeckelKaplan, Robert D, MD (Inactive) as Consulting Physician (Gastroenterology) Daiva EvesVan Dam, Lisette Grinderornelius N, MD as Consulting Physician (Infectious Diseases) Darden AmberEcherd, Robert, OD as Referring Physician (Optometry) [facility]  Indicate any recent Medical Services you may have received from other than [facility] providers in the past year (date may be approximate).     Assessment:   This is a routine wellness examination for Demarlo.  Hearing/Vision screen No results found.  Dietary issues and exercise activities discussed:     Goals Addressed   None   Depression Screen    09/01/2021    9:17 AM 05/02/2021    8:56 AM 12/26/2020    9:29 AM 12/23/2020    9:18 AM 09/23/2020   11:02 AM 05/24/2020    8:37 AM 01/25/2020    8:57 AM  PHQ 2/9 Scores  PHQ - 2 Score 0 0 0 0 0 0 0  PHQ- 9 Score 0 0 0  0 0 0    Fall Risk    09/01/2021    9:17 AM 05/02/2021    8:56 AM 12/26/2020    9:29 AM 12/23/2020    9:18 AM 09/23/2020   11:02 AM  Fall Risk   Falls in the past year? 0 0 0 1 0  Number falls in past yr: 0 0 0 0 0  Injury with Fall? 0 0 0 0 0  Risk for fall due to : No Fall Risks No Fall Risks No Fall Risks No Fall Risks No Fall Risks  Follow up Falls evaluation completed Falls evaluation completed Falls evaluation completed Falls evaluation completed Falls evaluation  completed    FALL RISK PREVENTION PERTAINING TO THE HOME:  Any stairs in or around the home? Yes  If so, are there any without handrails? No  Home free of loose throw rugs in walkways, pet beds, electrical cords, etc? Yes  Adequate lighting in your home to reduce risk of falls? Yes   ASSISTIVE DEVICES UTILIZED TO PREVENT FALLS:  Life alert? No  Use of a cane, walker or w/c? No  Grab bars in the bathroom? No  Shower chair or bench in shower? No  Elevated toilet seat or a handicapped toilet? No   TIMED UP AND GO:  Was the test performed? No .  Length of time to ambulate 10 feet: Telehealth sec.     Cognitive Function:        09/01/2021    9:20 AM  6CIT Screen  What Year? 0 points  What month? 0 points  What time? 0 points  Count back from 20 0 points  Months in reverse 0 points  Repeat phrase 0 points  Total Score 0 points    Immunizations Immunization History  Administered Date(s) Administered   Hepatitis A 04/12/2008, 09/13/2008   Hepatitis B 04/12/2008, 09/13/2008, 02/07/2009, 12/17/2011   Hepatitis B, adult 12/01/2015, 01/04/2016, 05/30/2016   Influenza Split 11/27/2010, 12/17/2011   Influenza Whole 12/29/2007, 01/24/2009, 04/17/2010   Influenza,inj,Quad PF,6+ Mos 11/10/2012, 11/04/2013, 12/01/2015, 11/29/2016, 12/16/2017, 10/29/2018   Influenza,inj,Quad PF,6-35 Mos 12/16/2014   Influenza-Unspecified 11/27/2019   PFIZER(Purple Top)SARS-COV-2 Vaccination 05/24/2019, 06/14/2019, 10/23/2019, 04/13/2020   Pneumococcal Conjugate-13 08/12/2017   Pneumococcal Polysaccharide-23 10/07/2007, 01/30/2010, 04/04/2017   Td 02/07/2009   Tdap 01/29/2010, 09/23/2020   Zoster Recombinat (Shingrix) 10/29/2018, 01/27/2019    TDAP status: Up to date  Flu Vaccine status: Up to date  Pneumococcal vaccine status: Up to date  Covid-19 vaccine status: Completed vaccines  Qualifies for Shingles Vaccine? Yes  Zostavax completed Yes   Shingrix Completed?: Yes  Screening  Tests Health Maintenance  Topic Date Due   COLONOSCOPY (Pts 45-66yrs Insurance coverage will need to be confirmed)  05/22/2017   OPHTHALMOLOGY EXAM  11/29/2017   COVID-19 Vaccine (5 - Booster for Pfizer series) 06/08/2020   FOOT EXAM  09/23/2021   INFLUENZA VACCINE  10/03/2021   HEMOGLOBIN A1C  10/30/2021   TETANUS/TDAP  09/24/2030   Hepatitis C Screening  Completed   HIV Screening  Completed   Zoster Vaccines- Shingrix  Completed   HPV VACCINES  Aged Out    Health Maintenance  Health Maintenance Due  Topic Date Due   COLONOSCOPY (Pts 45-76yrs Insurance coverage will need to be confirmed)  05/22/2017   OPHTHALMOLOGY EXAM  11/29/2017   COVID-19 Vaccine (5 - Booster for Pfizer series) 06/08/2020    Colonoscopy: Patient Declined  Lung Cancer Screening: (Low Dose CT Chest recommended if Age 78-80 years, 30 pack-year currently smoking OR have quit w/in 15years.) does not qualify.   Lung Cancer Screening Referral:   Additional Screening:  Hepatitis C Screening: does qualify; Patient declined   Vision Screening: Recommended annual ophthalmology exams for early detection of glaucoma and other disorders of the eye. Is the patient up to date with their annual eye exam?  Yes  Who is the provider or what is the name of the office in which the patient attends annual eye exams? [facility] If pt is not established with a provider, would they like to be referred to a provider to establish care? No .   Dental Screening: Recommended annual dental exams for proper oral hygiene  Community Resource Referral / Chronic Care Management: CRR required this visit?  No   CCM required this visit?  No      Plan:     I have personally reviewed and noted the following in the patient's chart:   Medical and social history Use of alcohol, tobacco or illicit drugs  Current medications and supplements including opioid prescriptions. Patient is not currently taking opioid prescriptions. Functional  ability and status Nutritional status Physical activity Advanced directives List of other physicians Hospitalizations, surgeries, and ER visits in previous 12 months Vitals Screenings to include cognitive, depression, and falls Referrals and appointments  In addition, I have reviewed and discussed with patient certain preventive protocols, quality metrics, and best practice recommendations. A written personalized care plan for preventive services as well as general preventive health recommendations were provided to patient.     Laren Everts Sumeya Yontz, CMA   09/01/2021   Nurse Notes: Non face to face 20 minutes.   Mr. Luiz , Thank you for taking time to come for your Medicare Wellness Visit. I appreciate your ongoing commitment to your health goals. Please review the following plan we discussed and let me know if I can assist you in the future.   These are the goals we discussed:  Goals   None    Colonoscopy  This is a list of the screening recommended for you and due dates:  Health Maintenance  Topic Date Due   Colon Cancer Screening  05/22/2017   Eye exam for diabetics  11/29/2017   COVID-19 Vaccine (5 - Booster for Pfizer series) 06/08/2020   Complete foot exam   09/23/2021   Flu Shot  10/03/2021   Hemoglobin A1C  10/30/2021   Tetanus Vaccine  09/24/2030   Hepatitis C Screening: USPSTF Recommendation to screen - Ages 18-79 yo.  Completed   HIV  Screening  Completed   Zoster (Shingles) Vaccine  Completed   HPV Vaccine  Aged Out

## 2021-09-12 ENCOUNTER — Other Ambulatory Visit: Payer: Medicare Other

## 2021-09-12 DIAGNOSIS — E559 Vitamin D deficiency, unspecified: Secondary | ICD-10-CM

## 2021-09-12 DIAGNOSIS — E039 Hypothyroidism, unspecified: Secondary | ICD-10-CM

## 2021-09-12 DIAGNOSIS — E1169 Type 2 diabetes mellitus with other specified complication: Secondary | ICD-10-CM

## 2021-09-12 DIAGNOSIS — I152 Hypertension secondary to endocrine disorders: Secondary | ICD-10-CM | POA: Diagnosis not present

## 2021-09-12 DIAGNOSIS — Z0000 Encounter for general adult medical examination without abnormal findings: Secondary | ICD-10-CM | POA: Diagnosis not present

## 2021-09-12 DIAGNOSIS — E1159 Type 2 diabetes mellitus with other circulatory complications: Secondary | ICD-10-CM

## 2021-09-13 LAB — CBC
Hematocrit: 40.1 % (ref 37.5–51.0)
Hemoglobin: 13.8 g/dL (ref 13.0–17.7)
MCH: 32.9 pg (ref 26.6–33.0)
MCHC: 34.4 g/dL (ref 31.5–35.7)
MCV: 96 fL (ref 79–97)
Platelets: 264 10*3/uL (ref 150–450)
RBC: 4.2 x10E6/uL (ref 4.14–5.80)
RDW: 12.5 % (ref 11.6–15.4)
WBC: 7.9 10*3/uL (ref 3.4–10.8)

## 2021-09-13 LAB — COMPREHENSIVE METABOLIC PANEL
ALT: 8 IU/L (ref 0–44)
AST: 15 IU/L (ref 0–40)
Albumin/Globulin Ratio: 1.8 (ref 1.2–2.2)
Albumin: 4.7 g/dL (ref 3.9–4.9)
Alkaline Phosphatase: 92 IU/L (ref 44–121)
BUN/Creatinine Ratio: 18 (ref 10–24)
BUN: 20 mg/dL (ref 8–27)
Bilirubin Total: 0.3 mg/dL (ref 0.0–1.2)
CO2: 23 mmol/L (ref 20–29)
Calcium: 9.6 mg/dL (ref 8.6–10.2)
Chloride: 100 mmol/L (ref 96–106)
Creatinine, Ser: 1.13 mg/dL (ref 0.76–1.27)
Globulin, Total: 2.6 g/dL (ref 1.5–4.5)
Glucose: 108 mg/dL — ABNORMAL HIGH (ref 70–99)
Potassium: 4.2 mmol/L (ref 3.5–5.2)
Sodium: 138 mmol/L (ref 134–144)
Total Protein: 7.3 g/dL (ref 6.0–8.5)
eGFR: 73 mL/min/{1.73_m2} (ref >59)

## 2021-09-13 LAB — HEMOGLOBIN A1C
Est. average glucose Bld gHb Est-mCnc: 134 mg/dL
Hgb A1c MFr Bld: 6.3 % — ABNORMAL HIGH (ref 4.8–5.6)

## 2021-09-13 LAB — LIPID PANEL
Chol/HDL Ratio: 2.5 ratio (ref 0.0–5.0)
Cholesterol, Total: 134 mg/dL (ref 100–199)
HDL: 54 mg/dL (ref >39)
LDL Chol Calc (NIH): 61 mg/dL (ref 0–99)
Triglycerides: 103 mg/dL (ref 0–149)
VLDL Cholesterol Cal: 19 mg/dL (ref 5–40)

## 2021-09-13 LAB — VITAMIN D 25 HYDROXY (VIT D DEFICIENCY, FRACTURES): Vit D, 25-Hydroxy: 52.3 ng/mL (ref 30.0–100.0)

## 2021-09-13 LAB — TSH: TSH: 3.2 u[IU]/mL (ref 0.450–4.500)

## 2021-09-16 ENCOUNTER — Other Ambulatory Visit: Payer: Self-pay | Admitting: Physician Assistant

## 2021-09-16 DIAGNOSIS — I152 Hypertension secondary to endocrine disorders: Secondary | ICD-10-CM

## 2021-09-22 ENCOUNTER — Ambulatory Visit: Payer: Medicare Other | Admitting: Infectious Disease

## 2021-10-16 ENCOUNTER — Ambulatory Visit (INDEPENDENT_AMBULATORY_CARE_PROVIDER_SITE_OTHER): Payer: Medicare Other | Admitting: Infectious Disease

## 2021-10-16 ENCOUNTER — Other Ambulatory Visit: Payer: Self-pay

## 2021-10-16 ENCOUNTER — Encounter: Payer: Self-pay | Admitting: Infectious Disease

## 2021-10-16 ENCOUNTER — Encounter (INDEPENDENT_AMBULATORY_CARE_PROVIDER_SITE_OTHER): Payer: Self-pay | Admitting: *Deleted

## 2021-10-16 VITALS — BP 131/84 | HR 65 | Temp 97.5°F | Wt 200.2 lb

## 2021-10-16 VITALS — BP 137/88 | HR 67 | Temp 97.4°F | Resp 16 | Ht 68.0 in | Wt 199.6 lb

## 2021-10-16 DIAGNOSIS — I2510 Atherosclerotic heart disease of native coronary artery without angina pectoris: Secondary | ICD-10-CM

## 2021-10-16 DIAGNOSIS — B20 Human immunodeficiency virus [HIV] disease: Secondary | ICD-10-CM | POA: Diagnosis not present

## 2021-10-16 DIAGNOSIS — E1121 Type 2 diabetes mellitus with diabetic nephropathy: Secondary | ICD-10-CM

## 2021-10-16 DIAGNOSIS — Z006 Encounter for examination for normal comparison and control in clinical research program: Secondary | ICD-10-CM

## 2021-10-16 MED ORDER — BIKTARVY 50-200-25 MG PO TABS: ORAL_TABLET | ORAL | 11 refills | Status: DC

## 2021-10-16 NOTE — Progress Notes (Signed)
Chief complaint: Follow-up for HIV on medications Subjective:    Patient ID: Jonathan Estes, male    DOB: 10-16-1955, 66 y.o.   MRN: [id number]  HPI  Jonathan Estes is a 66 year-old Caucasian man living with HIV also with comorbid diabetes mellitus coronary artery disease hyperlipidemia and hypothyroidism.  He has been perfectly controlled while taking Biktarvy with virological suppression and healthy immune status.  He has PCP locally  Delene Loll, PA.  He is having blood drawn with research and has had a undetectable viral load and healthy CD4 count.        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  Under fluoroscopy, transbronchial /notes 07/04/2010   CONDYLOMA EXCISION/FULGURATION  ~ 1996   anal/notes 07/04/2010    Family History  Problem Relation Age of Onset   Colon cancer Father    Leukemia Maternal Grandfather       Social History   Socioeconomic History   Marital status: Single    Spouse name: Not on file   Number of children: Not on file   Years of education: Not on file   Highest education level: Not on file  Occupational History   Occupation: Retired  Tobacco Use   Smoking status: Never   Smokeless tobacco: Never  Vaping Use   Vaping Use: Never  used  Substance and Sexual Activity   Alcohol use: Yes    Comment: 03/21/2017 "none since 01/12/2017"   Drug use: Yes    Types: Marijuana, Cocaine    Comment: 03/21/2017 "no cocaine for a few years; I smoke marijuana qd"   Sexual activity: Never    Partners: Female    Comment: declined condoms  Other Topics Concern   Not on file  Social History Narrative   Lives alone.         Social Determinants of Health   Financial Resource Strain: High Risk (09/01/2021)   Overall Financial Resource Strain (CARDIA)    Difficulty of Paying Living Expenses: Very hard  Food Insecurity: No Food Insecurity (09/01/2021)   Hunger Vital Sign    Worried About Running Out of Food in the Last Year: Never true    Ran Out of Food in the Last Year: Never true  Transportation Needs: No Transportation Needs (09/01/2021)   PRAPARE - Administrator, Civil Service (Medical): No    Lack of Transportation (Non-Medical): No  Physical Activity: Insufficiently Active (09/01/2021)   Exercise Vital Sign    Days of Exercise per Week: 2 days    Minutes of Exercise per Session: 30 min  Stress: No Stress Concern Present (09/01/2021)   [employer] of Occupational Health - Occupational Stress Questionnaire    Feeling of Stress : Not at all  Social Connections: Unknown (09/01/2021)   Social Connection and Isolation Panel [NHANES]    Frequency of Communication with Friends and Family: Patient refused    Frequency of Social Gatherings with Friends and Family: Once a week    Attends Religious Services: Never    Database administrator or Organizations: No    Attends Engineer, structural: Never    Marital Status: Patient refused    Allergies  Allergen Reactions   Sulfamethoxazole-Trimethoprim     REACTION: severe rash     Current Outpatient Medications:    ALPRAZolam (XANAX) 0.5 MG tablet, Take 1 tablet (0.5 mg total) by mouth 3 (three) times daily., Disp: 90 tablet, Rfl: 5    bictegravir-emtricitabine-tenofovir AF (BIKTARVY) 50-200-25 MG TABS tablet, TAKE 1 TABLET BY MOUTH 1 TIME A DAY., Disp: 30 tablet, Rfl: 11   cetirizine (ZYRTEC) 10 MG tablet, TAKE 1 TABLET (10 MG TOTAL) BY MOUTH DAILY., Disp: 90 tablet, Rfl: 2   [facility]S LANCETS ORIGINAL MISC, 1 application by Does not apply route daily., Disp: 90 each, Rfl: 4   gemfibrozil (LOPID) 600 MG tablet, TAKE 1 TABLET BY MOUTH TWICE A DAY, Disp: 180 tablet, Rfl: 1   glucose blood (ACCU-CHEK AVIVA) test strip, Use as instructed, Disp: 100 each, Rfl: 12   hydrochlorothiazide (MICROZIDE) 12.5 MG capsule, TAKE 1 CAPSULE BY MOUTH EVERY DAY, Disp: 90 capsule, Rfl: 1   levothyroxine (SYNTHROID) 25 MCG tablet, TAKE 1 TABLET BY MOUTH EVERY DAY BEFORE BREAKFAST, Disp: 90 tablet, Rfl: 1   lisinopril (ZESTRIL) 40 MG tablet, TAKE 1 TABLET BY MOUTH EVERY DAY, Disp: 90 tablet, Rfl: 1   metFORMIN (GLUCOPHAGE) 500 MG tablet, TAKE 0.5 TABLETS (250 MG TOTAL) BY MOUTH 2 (TWO) TIMES DAILY WITH A MEAL., Disp: 90 tablet, Rfl: 1   Misc Natural Products (OSTEO BI-FLEX JOINT SHIELD PO), Take 1 tablet by mouth daily., Disp: , Rfl:    Multiple Vitamins-Minerals (ICAPS AREDS 2 PO), Take by mouth., Disp: , Rfl:    pravastatin (PRAVACHOL) 40 MG tablet, TAKE 1 TABLET BY MOUTH EVERY DAY, Disp: 90 tablet, Rfl: 1   zinc gluconate 50 MG tablet, Take 50 mg by mouth daily., Disp: , Rfl:   Review of Systems  Constitutional:  Negative for chills and fever.  HENT:  Negative for congestion and sore throat.   Eyes:  Negative for photophobia.  Respiratory:  Negative for cough, shortness of breath and wheezing.   Cardiovascular:  Negative for chest pain, palpitations and leg swelling.  Gastrointestinal:  Negative for abdominal pain, blood in stool, constipation, diarrhea, nausea and vomiting.  Genitourinary:  Negative for dysuria, flank pain and hematuria.  Musculoskeletal:  Negative for back pain and myalgias.  Skin:  Negative for rash.  Neurological:  Negative for  dizziness, weakness and headaches.  Hematological:  Does not bruise/bleed easily.  Psychiatric/Behavioral:  Negative for suicidal ideas.        Objective:   Physical Exam Constitutional:      Appearance: He is well-developed.  HENT:     Head: Normocephalic and atraumatic.  Eyes:     Conjunctiva/sclera: Conjunctivae normal.  Cardiovascular:     Rate and Rhythm: Normal rate and regular rhythm.  Pulmonary:     Effort: Pulmonary effort is normal. No respiratory distress.     Breath sounds: No wheezing.  Abdominal:     General: There is no distension.     Palpations: Abdomen is soft.  Musculoskeletal:        General: No tenderness.  Normal range of motion.     Cervical back: Normal range of motion and neck supple.  Skin:    General: Skin is warm and dry.     Coloration: Skin is not pale.     Findings: No erythema or rash.  Neurological:     General: No focal deficit present.     Mental Status: He is alert and oriented to person, place, and time.  Psychiatric:        Mood and Affect: Mood normal.        Behavior: Behavior normal.        Thought Content: Thought content normal.        Judgment: Judgment normal.           Assessment & Plan:  HIV disease:  Viral load is being drawn today along with CD4 count  I am continue his Biktarvy    Diabetes mellitus followed by PCP on metformin  Hypothyroidism on Synthroid  Hypertension is continue on hydrochlorothiazide lisinopril Vitals:   10/16/21 0938  BP: 137/88  Pulse: 67  Resp: 16  Temp: (!) 97.4 F (36.3 C)  SpO2: 100%   Hyperlipidemia: on pravachold and lopid

## 2021-10-16 NOTE — Research (Signed)
Jonathan Estes here today for his week 480 for A5321, the [facility] study. No new complaints or medications. Verbalized excellent adherence with his Biktarvy. He will return in July for his next study.

## 2021-10-17 LAB — HEPATITIS C ANTIBODY: Hepatitis C Ab: NONREACTIVE

## 2021-10-17 LAB — CREATININE, SERUM: Creat: 1.19 mg/dL (ref 0.70–1.35)

## 2021-12-17 ENCOUNTER — Other Ambulatory Visit: Payer: Self-pay | Admitting: Physician Assistant

## 2021-12-17 DIAGNOSIS — E038 Other specified hypothyroidism: Secondary | ICD-10-CM

## 2021-12-22 ENCOUNTER — Other Ambulatory Visit: Payer: Self-pay | Admitting: Infectious Disease

## 2021-12-22 DIAGNOSIS — B20 Human immunodeficiency virus [HIV] disease: Secondary | ICD-10-CM

## 2022-02-16 ENCOUNTER — Other Ambulatory Visit: Payer: Self-pay

## 2022-02-16 ENCOUNTER — Telehealth: Payer: Self-pay | Admitting: *Deleted

## 2022-02-16 DIAGNOSIS — I152 Hypertension secondary to endocrine disorders: Secondary | ICD-10-CM

## 2022-02-16 DIAGNOSIS — E038 Other specified hypothyroidism: Secondary | ICD-10-CM

## 2022-02-16 DIAGNOSIS — E781 Pure hyperglyceridemia: Secondary | ICD-10-CM

## 2022-02-16 DIAGNOSIS — E1169 Type 2 diabetes mellitus with other specified complication: Secondary | ICD-10-CM

## 2022-02-16 DIAGNOSIS — E119 Type 2 diabetes mellitus without complications: Secondary | ICD-10-CM

## 2022-02-16 MED ORDER — GEMFIBROZIL 600 MG PO TABS: 600.0000 mg | ORAL_TABLET | Freq: Two times a day (BID) | ORAL | 1 refills | Status: DC

## 2022-02-16 MED ORDER — LISINOPRIL 40 MG PO TABS: 40.0000 mg | ORAL_TABLET | Freq: Every day | ORAL | 1 refills | Status: DC

## 2022-02-16 MED ORDER — LEVOTHYROXINE SODIUM 25 MCG PO TABS: ORAL_TABLET | ORAL | 1 refills | Status: DC

## 2022-02-16 MED ORDER — METFORMIN HCL 500 MG PO TABS: 250.0000 mg | ORAL_TABLET | Freq: Two times a day (BID) | ORAL | 1 refills | Status: DC

## 2022-02-16 MED ORDER — HYDROCHLOROTHIAZIDE 12.5 MG PO CAPS: 12.5000 mg | ORAL_CAPSULE | Freq: Every day | ORAL | 0 refills | Status: DC

## 2022-02-16 NOTE — Telephone Encounter (Signed)
Patient calling to request refills on the following medications.   gemfibrozil (LOPID) 600 MG tablet   hydrochlorothiazide (MICROZIDE) 12.5 MG capsule   levothyroxine (SYNTHROID) 25 MCG tablet   lisinopril (ZESTRIL) 40 MG tablet   metFORMIN (GLUCOPHAGE) 500 MG tablet   Ph[facility]acy: [id number] [place] [place] - [address] [facility]    LOV: 09/01/21-AWV ROV: Pt states he will call after new year to scheduled when new providers are here.

## 2022-02-16 NOTE — Telephone Encounter (Signed)
[facility]

## 2022-02-19 NOTE — Telephone Encounter (Signed)
Pt confirmed that he did get his Rx. Fumiye Lubben Zimmerman Rumple, CMA

## 2022-05-13 ENCOUNTER — Other Ambulatory Visit: Payer: Self-pay | Admitting: Nurse Practitioner

## 2022-05-13 DIAGNOSIS — I152 Hypertension secondary to endocrine disorders: Secondary | ICD-10-CM

## 2022-05-31 ENCOUNTER — Telehealth: Payer: Self-pay | Admitting: Nurse Practitioner

## 2022-05-31 ENCOUNTER — Other Ambulatory Visit: Payer: Self-pay | Admitting: Nurse Practitioner

## 2022-05-31 DIAGNOSIS — E1159 Type 2 diabetes mellitus with other circulatory complications: Secondary | ICD-10-CM

## 2022-05-31 MED ORDER — HYDROCHLOROTHIAZIDE 12.5 MG PO CAPS: 12.5000 mg | ORAL_CAPSULE | Freq: Every day | ORAL | 0 refills | Status: DC

## 2022-05-31 NOTE — Telephone Encounter (Signed)
Sent HCTZ to [facility]

## 2022-05-31 NOTE — Telephone Encounter (Signed)
NOTE NOT NEEDED ?

## 2022-05-31 NOTE — Telephone Encounter (Signed)
Pt schedule appt for 06/18/22 with Dr. Jeannine Kitten.  Please refill medication at to get him to his appt.

## 2022-05-31 NOTE — Telephone Encounter (Signed)
LVM informing pt of RX.  T. Dorothy Landgrebe, CMA 

## 2022-06-18 ENCOUNTER — Encounter: Payer: Self-pay | Admitting: Family Medicine

## 2022-06-18 ENCOUNTER — Ambulatory Visit (INDEPENDENT_AMBULATORY_CARE_PROVIDER_SITE_OTHER): Payer: Medicare Other | Admitting: Family Medicine

## 2022-06-18 ENCOUNTER — Ambulatory Visit: Payer: Medicare Other | Admitting: Family Medicine

## 2022-06-18 VITALS — BP 107/69 | HR 65 | Ht 68.0 in | Wt 204.0 lb

## 2022-06-18 DIAGNOSIS — E038 Other specified hypothyroidism: Secondary | ICD-10-CM | POA: Diagnosis not present

## 2022-06-18 DIAGNOSIS — E782 Mixed hyperlipidemia: Secondary | ICD-10-CM | POA: Diagnosis not present

## 2022-06-18 DIAGNOSIS — E1169 Type 2 diabetes mellitus with other specified complication: Secondary | ICD-10-CM

## 2022-06-18 DIAGNOSIS — I152 Hypertension secondary to endocrine disorders: Secondary | ICD-10-CM | POA: Diagnosis not present

## 2022-06-18 DIAGNOSIS — E1159 Type 2 diabetes mellitus with other circulatory complications: Secondary | ICD-10-CM

## 2022-06-18 LAB — POCT GLYCOSYLATED HEMOGLOBIN (HGB A1C): HbA1c, POC (controlled diabetic range): 6.6 % (ref 0.0–7.0)

## 2022-06-18 NOTE — Assessment & Plan Note (Signed)
Continue gemfibrozil and pravastatin - f/u lipid panel

## 2022-06-18 NOTE — Assessment & Plan Note (Signed)
F/u tsh Continue current treatment w/ levothyroxine

## 2022-06-18 NOTE — Assessment & Plan Note (Signed)
Well controlled on metformin 250mg  am/pm. Splits his 500mg  pill in half.   Diabetic foot exam performed.  No concerning findings Encouraged pt to get diabetic eye exam A1c 6.6%

## 2022-06-18 NOTE — Assessment & Plan Note (Signed)
Well controlled.  Continue lisinopril and hctz.  -f/u bmp

## 2022-06-18 NOTE — Patient Instructions (Signed)
It was nice to meet you,   I will call you with the results of your tests when I get them.   I will send in for a colon cancer screen to be mailed to your house.    Please follow up in about 6 months.

## 2022-06-18 NOTE — Progress Notes (Signed)
Established Patient Office Visit  Subjective   Patient ID: Jonathan Estes, male    DOB: Sep 30, 1955  Age: 67 y.o. MRN: [id number]  Chief Complaint  Patient presents with   Hypertension   Diabetes    HTN  - taking lisinopril and hctz.  No issues with these medications.  Dm2 - takes metformin 250mg  bid.  Cuts his pill in half.  Has had hypoglycemic episodes in the past. None recently.  Does not take any other antidiabetic medications. Has not gone to an ophthalmologist in a few years because they changed locations and it got to be too expensive.    Thyroid - takes his levothyroxine regularly.    Hld - takes his gemfibrozil, pravastatin.  No complaints.  No concerns  Does not need any refills.    Had a colonoscopy over ten years ago.  His issue with getting a new repeat is not having a ride to the GI office.  His mother is unable and insurance no longer pays for rides. Would be willing to do a stool sample screening.          ROS    Objective:     BP 107/69   Pulse 65   Ht 5\' 8"  (1.727 m)   Wt 204 lb (92.5 kg)   SpO2 100% Comment: on RA  BMI 31.02 kg/m    Physical Exam Constitutional:      Appearance: Jonathan Estes is normal weight.  Cardiovascular:     Rate and Rhythm: Normal rate.  Pulmonary:     Effort: Pulmonary effort is normal. No respiratory distress.  Neurological:     Mental Status: Jonathan Estes is alert.    Diabetic Foot Exam - Simple   Simple Foot Form Diabetic Foot exam was performed with the following findings: Yes 06/18/2022  9:31 AM  Visual Inspection See comments: Yes Sensation Testing Intact to touch and monofilament testing bilaterally: Yes Pulse Check Posterior Tibialis and Dorsalis pulse intact bilaterally: Yes Comments Hallux valgus bilaterally      Results for orders placed or performed in visit on 06/18/22  POCT HgB A1C  Result Value Ref Range   Hemoglobin A1C     HbA1c POC (<> result, manual entry)     HbA1c, POC (prediabetic range)      HbA1c, POC (controlled diabetic range) 6.6 0.0 - 7.0 %      The 10-year ASCVD risk score (Arnett DK, et al., 2019) is: 13.4%    Assessment & Plan:   Problem List Items Addressed This Visit       Cardiovascular and Mediastinum   Hypertension associated with diabetes (HCC) (Chronic)    Well controlled.  Continue lisinopril and hctz.  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 Splits his 500mg  pill in half.   Diabetic foot exam performed.  No concerning findings Encouraged pt to get diabetic eye exam A1c 6.6%      Relevant Orders   POCT HgB A1C (Completed)   Basic Metabolic Panel (BMET)    Return in about 6  months (around 12/18/2022) for DM, HTN.    Sandre Kitty, MD

## 2022-06-19 LAB — BASIC METABOLIC PANEL
BUN/Creatinine Ratio: 15 (ref 10–24)
BUN: 17 mg/dL (ref 8–27)
CO2: 25 mmol/L (ref 20–29)
Calcium: 10 mg/dL (ref 8.6–10.2)
Chloride: 97 mmol/L (ref 96–106)
Creatinine, Ser: 1.14 mg/dL (ref 0.76–1.27)
Glucose: 113 mg/dL — ABNORMAL HIGH (ref 70–99)
Potassium: 4.7 mmol/L (ref 3.5–5.2)
Sodium: 136 mmol/L (ref 134–144)
eGFR: 72 mL/min/{1.73_m2} (ref >59)

## 2022-06-19 LAB — LIPID PANEL
Chol/HDL Ratio: 3.1 ratio (ref 0.0–5.0)
Cholesterol, Total: 153 mg/dL (ref 100–199)
HDL: 49 mg/dL (ref >39)
LDL Chol Calc (NIH): 74 mg/dL (ref 0–99)
Triglycerides: 181 mg/dL — ABNORMAL HIGH (ref 0–149)
VLDL Cholesterol Cal: 30 mg/dL (ref 5–40)

## 2022-06-19 LAB — TSH: TSH: 2.04 u[IU]/mL (ref 0.450–4.500)

## 2022-06-21 NOTE — Progress Notes (Signed)
LVM for pt to call office to inform him of below.

## 2022-07-20 ENCOUNTER — Other Ambulatory Visit: Payer: Self-pay | Admitting: Nurse Practitioner

## 2022-07-20 DIAGNOSIS — E1159 Type 2 diabetes mellitus with other circulatory complications: Secondary | ICD-10-CM

## 2022-07-23 ENCOUNTER — Telehealth: Payer: Self-pay

## 2022-07-23 NOTE — Telephone Encounter (Signed)
Pt is requesting a refill    pravastatin (PRAVACHOL) 40 MG tablet   Pharmacy: [facility] [phone] [place], [place] - [address] AT [facility]   LOV 06/18/22 ROV 12/24/22

## 2022-07-24 ENCOUNTER — Other Ambulatory Visit: Payer: Self-pay | Admitting: *Deleted

## 2022-07-24 DIAGNOSIS — E1169 Type 2 diabetes mellitus with other specified complication: Secondary | ICD-10-CM

## 2022-07-24 MED ORDER — PRAVASTATIN SODIUM 40 MG PO TABS: 40.0000 mg | ORAL_TABLET | Freq: Every day | ORAL | 1 refills | Status: DC

## 2022-07-24 NOTE — Telephone Encounter (Signed)
Refill sent per Dr. Constance Goltz.

## 2022-08-11 ENCOUNTER — Other Ambulatory Visit: Payer: Self-pay | Admitting: Nurse Practitioner

## 2022-08-11 DIAGNOSIS — E119 Type 2 diabetes mellitus without complications: Secondary | ICD-10-CM

## 2022-08-11 DIAGNOSIS — E781 Pure hyperglyceridemia: Secondary | ICD-10-CM

## 2022-08-11 DIAGNOSIS — E1169 Type 2 diabetes mellitus with other specified complication: Secondary | ICD-10-CM

## 2022-08-26 ENCOUNTER — Other Ambulatory Visit: Payer: Self-pay | Admitting: Nurse Practitioner

## 2022-08-26 DIAGNOSIS — I152 Hypertension secondary to endocrine disorders: Secondary | ICD-10-CM

## 2022-09-17 ENCOUNTER — Ambulatory Visit: Payer: Medicare Other | Admitting: Infectious Disease

## 2022-09-17 ENCOUNTER — Encounter (INDEPENDENT_AMBULATORY_CARE_PROVIDER_SITE_OTHER): Payer: Medicare Other | Admitting: *Deleted

## 2022-09-17 ENCOUNTER — Other Ambulatory Visit: Payer: Self-pay

## 2022-09-17 VITALS — BP 111/73 | HR 65 | Temp 97.5°F | Wt 200.4 lb

## 2022-09-17 DIAGNOSIS — Z006 Encounter for examination for normal comparison and control in clinical research program: Secondary | ICD-10-CM | POA: Diagnosis not present

## 2022-09-17 NOTE — Research (Signed)
Jonathan Estes was here for his week 528 visit for A5321. He denies any new problems or medications. Continues to have lower back pain and stiffness and takes very little for it. He will return annually for the next 3 years.

## 2022-09-17 NOTE — Addendum Note (Signed)
Addended by: Arvilla Meres R on: 09/17/2022 04:50 PM   Modules accepted: Orders

## 2022-09-18 LAB — HEPATITIS C ANTIBODY: Hepatitis C Ab: NONREACTIVE

## 2022-09-18 LAB — CREATININE, SERUM: Creat: 1.18 mg/dL (ref 0.70–1.35)

## 2022-10-11 ENCOUNTER — Ambulatory Visit (INDEPENDENT_AMBULATORY_CARE_PROVIDER_SITE_OTHER): Payer: Medicare Other | Admitting: Infectious Disease

## 2022-10-11 ENCOUNTER — Encounter: Payer: Self-pay | Admitting: Infectious Disease

## 2022-10-11 ENCOUNTER — Other Ambulatory Visit: Payer: Self-pay

## 2022-10-11 VITALS — BP 127/82 | HR 69 | Temp 97.6°F | Wt 202.0 lb

## 2022-10-11 DIAGNOSIS — E785 Hyperlipidemia, unspecified: Secondary | ICD-10-CM

## 2022-10-11 DIAGNOSIS — B20 Human immunodeficiency virus [HIV] disease: Secondary | ICD-10-CM

## 2022-10-11 DIAGNOSIS — I2510 Atherosclerotic heart disease of native coronary artery without angina pectoris: Secondary | ICD-10-CM

## 2022-10-11 DIAGNOSIS — E1121 Type 2 diabetes mellitus with diabetic nephropathy: Secondary | ICD-10-CM

## 2022-10-11 DIAGNOSIS — E781 Pure hyperglyceridemia: Secondary | ICD-10-CM | POA: Diagnosis not present

## 2022-10-11 HISTORY — DX: Hyperlipidemia, unspecified: E78.5

## 2022-10-11 MED ORDER — BIKTARVY 50-200-25 MG PO TABS
ORAL_TABLET | ORAL | 11 refills | Status: DC
Start: 2022-10-11 — End: 2023-09-18

## 2022-10-11 NOTE — Progress Notes (Signed)
Chief complaint: Follow-up for HIV disease on medications s   Patient ID: Jonathan Estes, male    DOB: 1955-05-21, 67 y.o.   MRN: [id number]  HPI  [name] is a 67 year-old Caucasian man living with HIV also with comorbid diabetes mellitus coronary artery disease hyperlipidemia and hypothyroidism.  He has been perfectly controlled while taking Biktarvy with virological suppression and healthy immune status.  He had Michael's labs that were drawn through research including his viral load that was less than 40 and CD4 count that was well over 700.  His CMP was unremarkable lipids showed his LDL to be a little bit above potential goal but would defer to his PCP regarding that.       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  Under fluoroscopy, transbronchial /notes 07/04/2010   CONDYLOMA EXCISION/FULGURATION  ~ 1996   anal/notes 07/04/2010    Family History  Problem Relation Age of Onset   Colon cancer Father    Leukemia Maternal Grandfather       Social History   Socioeconomic History   Marital status: Single    Spouse name: Not on file   Number of children: Not on file   Years of education: Not on file   Highest education level: Not on file  Occupational History    Occupation: Retired  Tobacco Use   Smoking status: Never   Smokeless tobacco: Never  Vaping Use   Vaping status: Never Used  Substance and Sexual Activity   Alcohol use: Yes    Comment: 03/21/2017 "none since 01/12/2017"   Drug use: Yes    Types: Marijuana, Cocaine    Comment: 03/21/2017 "no cocaine for a few years; I smoke marijuana qd"   Sexual activity: Never    Partners: Female    Comment: declined condoms  Other Topics Concern   Not on file  Social History Narrative   Lives alone.         Social Determinants of Health   Financial Resource Strain: High Risk (09/01/2021)   Overall Financial Resource Strain (CARDIA)    Difficulty of Paying Living Expenses: Very hard  Food Insecurity: No Food Insecurity (09/01/2021)   Hunger Vital Sign    Worried About Running Out of Food in the Last Year: Never true    Ran Out of Food in the Last Year: Never true  Transportation Needs: No Transportation Needs (09/01/2021)   PRAPARE - Administrator, Civil Service (Medical): No    Lack of Transportation (Non-Medical): No  Physical Activity: Insufficiently Active (09/01/2021)   Exercise Vital Sign    Days of Exercise per Week: 2 days    Minutes of Exercise per Session: 30 min  Stress: No Stress Concern  Present (09/01/2021)   [employer] of Occupational Health - Occupational Stress Questionnaire    Feeling of Stress : Not at all  Social Connections: Unknown (09/01/2021)   Social Connection and Isolation Panel [NHANES]    Frequency of Communication with Friends and Family: Patient declined    Frequency of Social Gatherings with Friends and Family: Once a week    Attends Religious Services: Never    Database administrator or Organizations: No    Attends Engineer, structural: Never    Marital Status: Patient declined    Allergies  Allergen Reactions   Sulfamethoxazole-Trimethoprim     REACTION: severe rash     Current Outpatient Medications:    BIKTARVY  50-200-25 MG TABS tablet, TAKE 1 TABLET BY MOUTH 1 TIME A DAY., Disp: 30 tablet, Rfl: 11   cetirizine (ZYRTEC) 10 MG tablet, TAKE 1 TABLET (10 MG TOTAL) BY MOUTH DAILY., Disp: 90 tablet, Rfl: 2   [facility] LANCETS ORIGINAL MISC, 1 application by Does not apply route daily., Disp: 90 each, Rfl: 4   gemfibrozil (LOPID) 600 MG tablet, TAKE 1 TABLET BY MOUTH TWICE A DAY, Disp: 180 tablet, Rfl: 1   glucose blood (ACCU-CHEK AVIVA) test strip, Use as instructed, Disp: 100 each, Rfl: 12   hydrochlorothiazide (MICROZIDE) 12.5 MG capsule, TAKE 1 CAPSULE BY MOUTH EVERY DAY, Disp: 90 capsule, Rfl: 0   levothyroxine (SYNTHROID) 25 MCG tablet, TAKE 1 TABLET BY MOUTH EVERY DAY BEFORE BREAKFAST, Disp: 90 tablet, Rfl: 1   lisinopril (ZESTRIL) 40 MG tablet, TAKE 1 TABLET BY MOUTH EVERY DAY, Disp: 90 tablet, Rfl: 1   metFORMIN (GLUCOPHAGE) 500 MG tablet, TAKE 0.5 TABLET (250 MG TOTAL) BY MOUTH 2 (TWO) TIMES DAILY WITH A MEAL., Disp: 90 tablet, Rfl: 1   Misc Natural Products (OSTEO BI-FLEX JOINT SHIELD PO), Take 1 tablet by mouth daily., Disp: , Rfl:    Multiple Vitamins-Minerals (ICAPS AREDS 2 PO), Take by mouth., Disp: , Rfl:    pravastatin (PRAVACHOL) 40 MG tablet, Take 1 tablet (40 mg total) by mouth daily., Disp: 90 tablet, Rfl: 1   zinc gluconate 50 MG tablet, Take 50 mg by mouth daily., Disp: , Rfl:   Review of Systems  Constitutional:  Negative for activity change, appetite change, chills, diaphoresis, fatigue, fever and unexpected weight change.  HENT:  Negative for congestion, rhinorrhea, sinus pressure, sneezing, sore throat and trouble swallowing.   Eyes:  Negative for photophobia and visual disturbance.  Respiratory:  Negative for cough, chest tightness, shortness of breath, wheezing and stridor.   Cardiovascular:  Negative for chest pain, palpitations and leg swelling.  Gastrointestinal:  Negative for abdominal distention, abdominal pain, anal bleeding, blood in stool, constipation, diarrhea, nausea and  vomiting.  Genitourinary:  Negative for difficulty urinating, dysuria, flank pain and hematuria.  Musculoskeletal:  Negative for arthralgias, back pain, gait problem, joint swelling and myalgias.  Skin:  Negative for color change, pallor, rash and wound.  Neurological:  Negative for dizziness, tremors, weakness and light-headedness.  Hematological:  Negative for adenopathy. Does not bruise/bleed easily.  Psychiatric/Behavioral:  Negative for agitation, behavioral problems, confusion, decreased concentration, dysphoric mood and sleep disturbance.        Objective:   Physical Exam Constitutional:      Appearance: He is well-developed.  HENT:     Head: Normocephalic and atraumatic.  Eyes:     Conjunctiva/sclera: Conjunctivae normal.  Cardiovascular:     Rate and Rhythm: Normal rate and regular rhythm.  Pulmonary:  Effort: Pulmonary effort is normal. No respiratory distress.     Breath sounds: No wheezing.  Abdominal:     General: There is no distension.     Palpations: Abdomen is soft.  Musculoskeletal:        General: No tenderness. Normal range of motion.     Cervical back: Normal range of motion and neck supple.  Skin:    General: Skin is warm and dry.     Coloration: Skin is not pale.     Findings: No erythema or rash.  Neurological:     General: No focal deficit present.     Mental Status: He is alert and oriented to person, place, and time.  Psychiatric:        Mood and Affect: Mood normal.        Behavior: Behavior normal.        Thought Content: Thought content normal.        Judgment: Judgment normal.           Assessment & Plan:  HIV disease:  These most recent viral load is less than 40 and CD4 count above 700 CMP that was normal CBC with differential is normal  Diabetes followed by PCP and on very low-dose metformin now  Hypothyroidism on Synthroid   Hyperlipidemia he continues on 40 mg of Pravachol as well as gemfibrozil.  Will consider  exchanging the Pravachol for Crestor but defer to primary care

## 2022-10-21 ENCOUNTER — Other Ambulatory Visit: Payer: Self-pay | Admitting: Family Medicine

## 2022-10-21 DIAGNOSIS — E1169 Type 2 diabetes mellitus with other specified complication: Secondary | ICD-10-CM

## 2022-10-30 ENCOUNTER — Encounter (INDEPENDENT_AMBULATORY_CARE_PROVIDER_SITE_OTHER): Payer: Medicare Other

## 2022-10-30 VITALS — Ht 68.0 in | Wt 202.0 lb

## 2022-10-30 DIAGNOSIS — Z0000 Encounter for general adult medical examination without abnormal findings: Secondary | ICD-10-CM | POA: Diagnosis not present

## 2022-10-30 NOTE — Addendum Note (Signed)
Addended by: Tillie Rung on: 10/30/2022 10:52 AM   Modules accepted: Orders

## 2022-10-30 NOTE — Addendum Note (Signed)
Addended by: Tillie Rung on: 10/30/2022 03:09 PM   Modules accepted: Level of Service

## 2022-10-30 NOTE — Patient Instructions (Addendum)
Jonathan Estes , Thank you for taking time to come for your Medicare Wellness Visit. I appreciate your ongoing commitment to your health goals. Please review the following plan we discussed and let me know if I can assist you in the future.   Referrals/Orders/Follow-Ups/Clinician Recommendations:   This is a list of the screening recommended for you and due dates:  Health Maintenance  Topic Date Due   Colon Cancer Screening  05/22/2017   Eye exam for diabetics  11/29/2017   Yearly kidney health urinalysis for diabetes  09/23/2021   COVID-19 Vaccine (5 - 2023-24 season) 11/03/2021   Flu Shot  10/04/2022   Hemoglobin A1C  12/18/2022   Complete foot exam   06/18/2023   Yearly kidney function blood test for diabetes  09/17/2023   Medicare Annual Wellness Visit  10/30/2023   DTaP/Tdap/Td vaccine (4 - Td or Tdap) 09/24/2030   Hepatitis C Screening  Completed   HIV Screening  Completed   Zoster (Shingles) Vaccine  Completed   HPV Vaccine  Aged Out    Advanced directives: (Declined) Advance directive discussed with you today. Even though you declined this today, please call our office should you change your mind, and we can give you the proper paperwork for you to fill out.  Next Medicare Annual Wellness Visit scheduled for next year: Yes

## 2022-10-30 NOTE — Progress Notes (Addendum)
Subjective:           Plan:        Subjective:   Jonathan Estes is a 67 y.o. male who presents for Medicare Annual/Subsequent preventive examination.  Visit Complete: Virtual  I connected with  Jonathan Estes on 10/30/22 by a audio enabled telemedicine application and verified that I am speaking with the correct person using two identifiers.  Patient Location: Home  Provider Location: Home Office  I discussed the limitations of evaluation and management by telemedicine. The patient expressed understanding and agreed to proceed.    Review of Systems    Vital Signs: Unable to obtain new vitals due to this being a telehealth visit.  Cardiac Risk Factors include: advanced age (>21men, >27 women);male gender;diabetes mellitus;hypertension     Objective:    Today's Vitals   10/30/22 1023 10/30/22 1428  Weight: 202 lb (91.6 kg)   Height: 5\' 8"  (1.727 m)   PainSc:  0-No pain   Body mass index is 30.71 kg/m.     10/30/2022    2:35 PM 01/21/2018   11:10 AM 03/21/2017    5:03 PM 11/26/2016    8:19 AM 07/23/2016    2:47 PM  Advanced Directives  Does Patient Have a Medical Advance Directive? No No No No No  Would patient like information on creating a medical advance directive? No - Patient declined No - Patient declined No - Patient declined No - Patient declined No - Patient declined    Current Medications (verified) Outpatient Encounter Medications as of 10/30/2022  Medication Sig   bictegravir-emtricitabine-tenofovir AF (BIKTARVY) 50-200-25 MG TABS tablet TAKE 1 TABLET BY MOUTH 1 TIME A DAY.   cetirizine (ZYRTEC) 10 MG tablet TAKE 1 TABLET (10 MG TOTAL) BY MOUTH DAILY.   [facility] LANCETS ORIGINAL MISC 1 application by Does not apply route daily.   gemfibrozil (LOPID) 600 MG tablet TAKE 1 TABLET BY MOUTH TWICE A DAY   glucose blood (ACCU-CHEK AVIVA) test strip Use as instructed   hydrochlorothiazide (MICROZIDE) 12.5 MG capsule TAKE 1 CAPSULE BY MOUTH EVERY DAY    levothyroxine (SYNTHROID) 25 MCG tablet TAKE 1 TABLET BY MOUTH EVERY DAY BEFORE BREAKFAST   lisinopril (ZESTRIL) 40 MG tablet TAKE 1 TABLET BY MOUTH EVERY DAY   metFORMIN (GLUCOPHAGE) 500 MG tablet TAKE 0.5 TABLET (250 MG TOTAL) BY MOUTH 2 (TWO) TIMES DAILY WITH A MEAL.   Misc Natural Products (OSTEO BI-FLEX JOINT SHIELD PO) Take 1 tablet by mouth daily.   Multiple Vitamins-Minerals (ICAPS AREDS 2 PO) Take by mouth.   pravastatin (PRAVACHOL) 40 MG tablet TAKE 1 TABLET BY MOUTH EVERY DAY   zinc gluconate 50 MG tablet Take 50 mg by mouth daily.   No facility-administered encounter medications on file as of 10/30/2022.    Allergies (verified) Sulfamethoxazole-trimethoprim   History: Past Medical History:  Diagnosis Date   AIDS (HCC) 07/12/2014   AKI (acute kidney injury) (HCC) 08/12/2017   Anxiety    Arthritis    "hips, shoulders" (03/21/2017)   Bilateral bunions    left foot worse   Coronary artery disease involving native coronary artery 01/12/2015   Diabetes mellitus type 2, controlled, without complications (HCC) 01/12/2015   Diabetes type 2, controlled (HCC) 07/12/2014   High triglycerides    HTN (hypertension)    Human immunodeficiency virus (HIV) disease (HCC) d'x 09/2007   Hyperlipidemia    Hyperlipidemia 10/11/2022   Hypothyroid    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  Under fluoroscopy, transbronchial /notes 07/04/2010   CONDYLOMA EXCISION/FULGURATION  ~ 1996   anal/notes 07/04/2010   Family History  Problem Relation Age of Onset   Colon cancer Father    Leukemia Maternal Grandfather    Social History   Socioeconomic History   Marital status: Single    Spouse name: Not on file   Number of children: Not on file   Years of education: Not on file   Highest education level: Not on file   Occupational History   Occupation: Retired  Tobacco Use   Smoking status: Never   Smokeless tobacco: Never  Vaping Use   Vaping status: Never Used  Substance and Sexual Activity   Alcohol use: Yes    Comment: 03/21/2017 "none since 01/12/2017"   Drug use: Yes    Types: Marijuana, Cocaine    Comment: 03/21/2017 "no cocaine for a few years; I smoke marijuana qd"   Sexual activity: Never    Partners: Female    Comment: declined condoms  Other Topics Concern   Not on file  Social History Narrative   Lives alone.         Social Determinants of Health   Financial Resource Strain: Low Risk  (10/30/2022)   Overall Financial Resource Strain (CARDIA)    Difficulty of Paying Living Expenses: Not hard at all  Food Insecurity: No Food Insecurity (10/30/2022)   Hunger Vital Sign    Worried About Running Out of Food in the Last Year: Never true    Ran Out of Food in the Last Year: Never true  Transportation Needs: No Transportation Needs (10/30/2022)   PRAPARE - Administrator, Civil Service (Medical): No    Lack of Transportation (Non-Medical): No  Physical Activity: Inactive (10/30/2022)   Exercise Vital Sign    Days of Exercise per Week: 0 days    Minutes of Exercise per Session: 0 min  Stress: No Stress Concern Present (10/30/2022)   [employer] of Occupational Health - Occupational Stress Questionnaire    Feeling of Stress : Not at all  Social Connections: Socially Isolated (10/30/2022)   Social Connection and Isolation Panel [NHANES]    Frequency of Communication with Friends and Family: More than three times a week    Frequency of Social Gatherings with Friends and Family: More than three times a week    Attends Religious Services: Never    Database administrator or Organizations: No    Attends Engineer, structural: Never    Marital Status: Never married    Tobacco Counseling Counseling given: Not Answered   Clinical Intake:  Pre-visit  preparation completed: Yes  Pain : No/denies pain Pain Score: 0-No pain     BMI - recorded: 30.71 Nutritional Status: BMI > 30  Obese Nutritional Risks: None Diabetes: Yes CBG done?: No Did pt. bring in CBG monitor from home?: No  How often do you need to have someone help you when you read instructions, pamphlets, or other written materials from your doctor or pharmacy?: 1 - Never  Interpreter Needed?: No  Information entered by :: Theresa Mulligan LPN   Activities of Daily Living    10/30/2022    2:34 PM  In your present state of health, do you have any difficulty performing the following activities:  Hearing? 0  Vision? 0  Difficulty concentrating or making decisions? 0  Walking or climbing stairs? 0  Dressing or bathing? 0  Doing errands, shopping? 0  Preparing Food and eating ? N  Using the Toilet? N  In the past six months, have you accidently leaked urine? N  Do you have problems with loss of bowel control? N  Managing your Medications? N  Managing your Finances? N  Housekeeping or managing your Housekeeping? N    Patient Care Team: Sandre Kitty, MD as PCP - General (Family Medicine) Louis Meckel, MD (Inactive) as Consulting Physician (Gastroenterology) Daiva Eves, Lisette Grinder, MD as Consulting Physician (Infectious Diseases) Darden Amber, OD as Referring Physician (Optometry) [facility]  Indicate any recent Medical Services you may have received from other than [facility] providers in the past year (date may be approximate).     Assessment:   This is a routine wellness examination for Reda.  Hearing/Vision screen Hearing Screening - Comments:: Denies hearing difficulties   Vision Screening - Comments:: Wears reading glasses - Not up to date with routine eye exams with  Deferred  Dietary issues and exercise activities discussed:     Goals Addressed               This Visit's Progress     Live day to Day (pt-stated)          Depression Screen    10/30/2022    2:34 PM 10/11/2022    9:00 AM 06/18/2022    9:09 AM 10/16/2021    9:40 AM 09/01/2021    9:17 AM 05/02/2021    8:56 AM 12/26/2020    9:29 AM  PHQ 2/9 Scores  PHQ - 2 Score 0 0 0 0 0 0 0  PHQ- 9 Score     0 0 0    Fall Risk    10/30/2022    2:35 PM 10/11/2022    9:00 AM 10/16/2021    9:40 AM 09/01/2021    9:17 AM 05/02/2021    8:56 AM  Fall Risk   Falls in the past year? 0 0 0 0 0  Number falls in past yr: 0 0 0 0 0  Injury with Fall? 0 0 0 0 0  Risk for fall due to : No Fall Risks  No Fall Risks No Fall Risks No Fall Risks  Follow up Falls prevention discussed  Falls evaluation completed Falls evaluation completed Falls evaluation completed    MEDICARE RISK AT HOME: Medicare Risk at Home Any stairs in or around the home?: No If so, are there any without handrails?: No Home free of loose throw rugs in walkways, pet beds, electrical cords, etc?: Yes Adequate lighting in your home to reduce risk of falls?: Yes Life alert?: No Use of a cane, walker or w/c?: No Grab bars in the bathroom?: Yes Shower chair or bench in shower?: Yes Elevated toilet seat or a handicapped toilet?: Yes  TIMED UP AND GO:  Was the test performed?  No    Cognitive Function:        10/30/2022    2:36 PM 09/01/2021    9:20 AM  6CIT Screen  What Year? 0 points 0 points  What month? 0 points 0 points  What time? 0 points 0 points  Count back from 20 0 points 0 points  Months in reverse 0 points 0 points  Repeat phrase 0 points 0 points  Total Score 0 points 0 points  Immunizations Immunization History  Administered Date(s) Administered   Hepatitis A 04/12/2008, 09/13/2008   Hepatitis B 04/12/2008, 09/13/2008, 02/07/2009, 12/17/2011   Hepatitis B, ADULT 12/01/2015, 01/04/2016, 05/30/2016   Influenza Split 11/27/2010, 12/17/2011   Influenza Whole 12/29/2007, 01/24/2009, 04/17/2010   Influenza,inj,Quad PF,6+ Mos 11/10/2012, 11/04/2013, 12/01/2015,  11/29/2016, 12/16/2017, 10/29/2018   Influenza,inj,Quad PF,6-35 Mos 12/16/2014   Influenza-Unspecified 11/27/2019   PFIZER(Purple Top)SARS-COV-2 Vaccination 05/24/2019, 06/14/2019, 10/23/2019, 04/13/2020   Pneumococcal Conjugate-13 08/12/2017   Pneumococcal Polysaccharide-23 10/07/2007, 01/30/2010, 04/04/2017   Td 02/07/2009   Tdap 01/29/2010, 09/23/2020   Zoster Recombinant(Shingrix) 10/29/2018, 01/27/2019    TDAP status: Up to date  Flu Vaccine status: Due, Education has been provided regarding the importance of this vaccine. Advised may receive this vaccine at local pharmacy or Health Dept. Aware to provide a copy of the vaccination record if obtained from local pharmacy or Health Dept. Verbalized acceptance and understanding.    Covid-19 vaccine status: Declined, Education has been provided regarding the importance of this vaccine but patient still declined. Advised may receive this vaccine at local pharmacy or Health Dept.or vaccine clinic. Aware to provide a copy of the vaccination record if obtained from local pharmacy or Health Dept. Verbalized acceptance and understanding.  Qualifies for Shingles Vaccine? Yes   Zostavax completed Yes   Shingrix Completed?: Yes  Screening Tests Health Maintenance  Topic Date Due   Colonoscopy  05/22/2017   OPHTHALMOLOGY EXAM  11/29/2017   Diabetic kidney evaluation - Urine ACR  09/23/2021   COVID-19 Vaccine (5 - 2023-24 season) 11/03/2021   INFLUENZA VACCINE  10/04/2022   HEMOGLOBIN A1C  12/18/2022   FOOT EXAM  06/18/2023   Diabetic kidney evaluation - eGFR measurement  09/17/2023   Medicare Annual Wellness (AWV)  10/30/2023   DTaP/Tdap/Td (4 - Td or Tdap) 09/24/2030   Hepatitis C Screening  Completed   HIV Screening  Completed   Zoster Vaccines- Shingrix  Completed   HPV VACCINES  Aged Out    Health Maintenance  Health Maintenance Due  Topic Date Due   Colonoscopy  05/22/2017   OPHTHALMOLOGY EXAM  11/29/2017   Diabetic kidney  evaluation - Urine ACR  09/23/2021   COVID-19 Vaccine (5 - 2023-24 season) 11/03/2021   INFLUENZA VACCINE  10/04/2022    Colorectal cancer screening: Referral to GI placed Patient deferred. Pt aware the office will call re: appt.  Lung Cancer Screening: (Low Dose CT Chest recommended if Age 13-80 years, 20 pack-year currently smoking OR have quit w/in 15years.) does not qualify.    Additional Screening:  Hepatitis C Screening: does qualify; Completed 09/17/22  Vision Screening: Recommended annual ophthalmology exams for early detection of glaucoma and other disorders of the eye. Is the patient up to date with their annual eye exam?  No Who is the provider or what is the name of the office in which the patient attends annual eye exams? Deferred If pt is not established with a provider, would they like to be referred to a provider to establish care? No .   Dental Screening: Recommended annual dental exams for proper oral hygiene  Diabetic Foot Exam: Diabetic Foot Exam: Completed 06/18/22  Community Resource Referral / Chronic Care Management:  CRR required this visit?  No   CCM required this visit?  No     Plan:     I have personally reviewed and noted the following in the patient's chart:   Medical and social history Use of alcohol, tobacco or illicit drugs  Current medications  and supplements including opioid prescriptions. Patient is not currently taking opioid prescriptions. Functional ability and status Nutritional status Physical activity Advanced directives List of other physicians Hospitalizations, surgeries, and ER visits in previous 12 months Vitals Screenings to include cognitive, depression, and falls Referrals and appointments  In addition, I have reviewed and discussed with patient certain preventive protocols, quality metrics, and best practice recommendations. A written personalized care plan for preventive services as well as general preventive health  recommendations were provided to patient.     Tillie Rung, LPN   [id number]   After Visit Summary: (MyChart) Due to this being a telephonic visit, the after visit summary with patients personalized plan was offered to patient via MyChart   Nurse Notes: None

## 2022-11-12 ENCOUNTER — Other Ambulatory Visit: Payer: Self-pay | Admitting: Family Medicine

## 2022-11-12 DIAGNOSIS — E1169 Type 2 diabetes mellitus with other specified complication: Secondary | ICD-10-CM

## 2022-11-12 DIAGNOSIS — E781 Pure hyperglyceridemia: Secondary | ICD-10-CM

## 2022-11-12 DIAGNOSIS — E119 Type 2 diabetes mellitus without complications: Secondary | ICD-10-CM

## 2022-11-21 ENCOUNTER — Other Ambulatory Visit: Payer: Self-pay | Admitting: Nurse Practitioner

## 2022-11-21 ENCOUNTER — Other Ambulatory Visit: Payer: Self-pay | Admitting: Family Medicine

## 2022-11-21 DIAGNOSIS — E038 Other specified hypothyroidism: Secondary | ICD-10-CM

## 2022-11-21 DIAGNOSIS — I152 Hypertension secondary to endocrine disorders: Secondary | ICD-10-CM

## 2022-11-21 MED ORDER — LEVOTHYROXINE SODIUM 25 MCG PO TABS
ORAL_TABLET | ORAL | 1 refills | Status: DC
Start: 2022-11-21 — End: 2023-05-15

## 2022-11-21 NOTE — Telephone Encounter (Signed)
Refill for synthroid sent.

## 2022-12-07 ENCOUNTER — Other Ambulatory Visit: Payer: Self-pay | Admitting: Family Medicine

## 2022-12-07 DIAGNOSIS — Z1212 Encounter for screening for malignant neoplasm of rectum: Secondary | ICD-10-CM

## 2022-12-07 DIAGNOSIS — Z1211 Encounter for screening for malignant neoplasm of colon: Secondary | ICD-10-CM

## 2022-12-14 ENCOUNTER — Other Ambulatory Visit: Payer: Self-pay | Admitting: Family Medicine

## 2022-12-14 DIAGNOSIS — I152 Hypertension secondary to endocrine disorders: Secondary | ICD-10-CM

## 2022-12-19 DIAGNOSIS — Z1211 Encounter for screening for malignant neoplasm of colon: Secondary | ICD-10-CM | POA: Diagnosis not present

## 2022-12-19 DIAGNOSIS — Z1212 Encounter for screening for malignant neoplasm of rectum: Secondary | ICD-10-CM | POA: Diagnosis not present

## 2022-12-24 ENCOUNTER — Ambulatory Visit (INDEPENDENT_AMBULATORY_CARE_PROVIDER_SITE_OTHER): Payer: Medicare Other | Admitting: Family Medicine

## 2022-12-24 ENCOUNTER — Encounter: Payer: Self-pay | Admitting: Family Medicine

## 2022-12-24 VITALS — BP 125/80 | HR 66 | Ht 68.0 in | Wt 205.1 lb

## 2022-12-24 DIAGNOSIS — E118 Type 2 diabetes mellitus with unspecified complications: Secondary | ICD-10-CM

## 2022-12-24 DIAGNOSIS — R918 Other nonspecific abnormal finding of lung field: Secondary | ICD-10-CM

## 2022-12-24 DIAGNOSIS — R911 Solitary pulmonary nodule: Secondary | ICD-10-CM | POA: Diagnosis not present

## 2022-12-24 DIAGNOSIS — Z7984 Long term (current) use of oral hypoglycemic drugs: Secondary | ICD-10-CM | POA: Diagnosis not present

## 2022-12-24 LAB — POCT GLYCOSYLATED HEMOGLOBIN (HGB A1C): HbA1c POC (<> result, manual entry): 6.5 % (ref 4.0–5.6)

## 2022-12-24 NOTE — Assessment & Plan Note (Signed)
Patient had imaging and 2012 and 2013 that showed left sided pulmonary nodule, favored to be scarring.  Patient is asymptomatic but on exam I appreciated some left-sided crackles.  These were mild.  Patient does not smoke tobacco but does smoke marijuana.  We discussed getting a chest x-ray, more for reassurance due to the exam findings and the previous CT findings.  Does not appear any follow-up imaging was recommended after that 2013 CT scan.

## 2022-12-24 NOTE — Patient Instructions (Addendum)
It was nice to see you today,  We addressed the following topics today: -For your diabetic retinopathy exams, you would need to find a new optometrist.  If you wish for [place] to schedule one for you or send in a referral to an ophthalmologist let us know. - You can go to the [facility] imaging center at your earliest convenience.  Address listed below.  You can call ahead of time to see if you can walk-in for chest x-ray but the order is already placed. -[address], [place], [place] [address]  Have a great day,  Frederic Jericho, MD

## 2022-12-24 NOTE — Assessment & Plan Note (Signed)
Continues to be well-controlled on half a tablet of 500 mg metformin twice daily.  No hypoglycemic episodes.  A1c 6.5 today.  Recommended patient establish with a new optometrist/ophthalmologist.  UACR was obtained today.

## 2022-12-24 NOTE — Progress Notes (Unsigned)
I am asked about the year for okay yeah I will sign  Established Patient Office Visit  Subjective   Patient ID: Jonathan Estes, male    DOB: May 29, 1955  Age: 67 y.o. MRN: [id number]  Chief Complaint  Patient presents with   Medical Management of Chronic Issues    HPI DM2-patient still taking half a tab of metformin in the morning half a tab at night.  No hypoglycemic episodes in quite some time.  Patient was previously seeing Dr. Hanley Seamen but has not seen him since he moved to [facility] eye care.  He is actively looking for a new ophthalmologist/optometrist for his diabetic retinopathy screenings.  Patient states that he has sent off the Cologuard and is awaiting the results.  Patient compliant with his other medications.  Patient had some rales on exam on the left side.  He is asymptomatic with no cough or shortness of breath.  We discussed his CT findings from 10 years ago that showed lower left lobe cavitary lesion in 2012 that then showed a nodular 1.2 cm lesion in the same area in 2013.  This was thought to be scarring.  No imaging since that time.    The 10-year ASCVD risk score (Arnett DK, et al., 2019) is: 20.1%  Health Maintenance Due  Topic Date Due   Colonoscopy  05/22/2017   OPHTHALMOLOGY EXAM  11/29/2017   Diabetic kidney evaluation - Urine ACR  09/23/2021   COVID-19 Vaccine (5 - 2023-24 season) 11/04/2022      Objective:     BP 125/80   Pulse 66   Ht 5\' 8"  (1.727 m)   Wt 205 lb 1.9 oz (93 kg)   SpO2 98%   BMI 31.19 kg/m  {Vitals History (Optional):23777}  Physical Exam General: Alert, oriented CV: Regular rate and rhythm Pulmonary: Mild inspiratory rales appreciated on the left side midlung and below.  Otherwise no respiratory abnormalities.   Results for orders placed or performed in visit on 12/24/22  POCT glycosylated hemoglobin (Hb A1C)  Result Value Ref Range   Hemoglobin A1C     HbA1c POC (<> result, manual entry) 6.5 4.0 - 5.6 %   HbA1c, POC  (prediabetic range)     HbA1c, POC (controlled diabetic range)          Assessment & Plan:   Controlled type 2 diabetes mellitus with complication, without long-term current use of insulin (HCC) Assessment & Plan: Continues to be well-controlled on half a tablet of 500 mg metformin twice daily.  No hypoglycemic episodes.  A1c 6.5 today.  Recommended patient establish with a new optometrist/ophthalmologist.  UACR was obtained today.  Orders: -     Microalbumin / creatinine urine ratio -     POCT glycosylated hemoglobin (Hb A1C)  Abnormal CT scan of lung -     DG Chest 2 View; Future  Pulmonary nodule Assessment & Plan: Patient had imaging and 2012 and 2013 that showed left sided pulmonary nodule, favored to be scarring.  Patient is asymptomatic but on exam I appreciated some left-sided crackles.  These were mild.  Patient does not smoke tobacco but does smoke marijuana.  We discussed getting a chest x-ray, more for reassurance due to the exam findings and the previous CT findings.  Does not appear any follow-up imaging was recommended after that 2013 CT scan.      Return in about 6 months (around 06/24/2023) for DM.    Sandre Kitty, MD

## 2022-12-25 ENCOUNTER — Encounter: Payer: Self-pay | Admitting: Family Medicine

## 2022-12-25 LAB — MICROALBUMIN / CREATININE URINE RATIO
Creatinine, Urine: 32 mg/dL
Microalb/Creat Ratio: 13 mg/g{creat} (ref 0–29)
Microalbumin, Urine: 4.1 ug/mL

## 2022-12-26 ENCOUNTER — Other Ambulatory Visit: Payer: Self-pay | Admitting: Family Medicine

## 2022-12-26 ENCOUNTER — Ambulatory Visit
Admission: RE | Admit: 2022-12-26 | Discharge: 2022-12-26 | Disposition: A | Discharge status: Home / Self Care | Payer: Medicare Other | Source: Ambulatory Visit | Attending: Family Medicine

## 2022-12-26 DIAGNOSIS — R918 Other nonspecific abnormal finding of lung field: Secondary | ICD-10-CM

## 2022-12-26 LAB — COLOGUARD: COLOGUARD: NEGATIVE

## 2023-02-27 ENCOUNTER — Other Ambulatory Visit: Payer: Self-pay | Admitting: Family Medicine

## 2023-02-27 DIAGNOSIS — E1169 Type 2 diabetes mellitus with other specified complication: Secondary | ICD-10-CM

## 2023-02-27 DIAGNOSIS — E781 Pure hyperglyceridemia: Secondary | ICD-10-CM

## 2023-03-05 ENCOUNTER — Other Ambulatory Visit: Payer: Self-pay | Admitting: Family Medicine

## 2023-03-05 DIAGNOSIS — I152 Hypertension secondary to endocrine disorders: Secondary | ICD-10-CM

## 2023-05-09 ENCOUNTER — Other Ambulatory Visit: Payer: Self-pay | Admitting: Family Medicine

## 2023-05-09 DIAGNOSIS — E1159 Type 2 diabetes mellitus with other circulatory complications: Secondary | ICD-10-CM

## 2023-05-15 ENCOUNTER — Other Ambulatory Visit: Payer: Self-pay | Admitting: Family Medicine

## 2023-05-15 DIAGNOSIS — E038 Other specified hypothyroidism: Secondary | ICD-10-CM

## 2023-05-17 ENCOUNTER — Other Ambulatory Visit: Payer: Self-pay | Admitting: Family Medicine

## 2023-05-17 DIAGNOSIS — E1169 Type 2 diabetes mellitus with other specified complication: Secondary | ICD-10-CM

## 2023-05-31 ENCOUNTER — Other Ambulatory Visit: Payer: Self-pay | Admitting: Family Medicine

## 2023-05-31 DIAGNOSIS — E1159 Type 2 diabetes mellitus with other circulatory complications: Secondary | ICD-10-CM

## 2023-05-31 DIAGNOSIS — E119 Type 2 diabetes mellitus without complications: Secondary | ICD-10-CM

## 2023-06-23 ENCOUNTER — Other Ambulatory Visit: Payer: Self-pay | Admitting: Family Medicine

## 2023-06-23 DIAGNOSIS — E119 Type 2 diabetes mellitus without complications: Secondary | ICD-10-CM

## 2023-06-23 NOTE — Progress Notes (Signed)
   Established Patient Office Visit  Subjective   Patient ID: Jonathan Estes, male    DOB: 1956-02-14  Age: 68 y.o. MRN: [id number]  No chief complaint on file.   HPI  DM2-500 mg twice daily.  Ophthalmology?  Lung findings-CT scan?  High-res CT?  He wanted to hold off.  HIV-follows with Dr. Ernie Heal   The 10-year ASCVD risk score (Arnett DK, et al., 2019) is: 21.8%  Health Maintenance Due  Topic Date Due   OPHTHALMOLOGY EXAM  11/29/2017   Pneumonia Vaccine 103+ Years old (4 of 4 - PCV20 or PCV21) 08/13/2022   COVID-19 Vaccine (5 - 2024-25 season) 11/04/2022   FOOT EXAM  06/18/2023      Objective:     There were no vitals taken for this visit. {Vitals History (Optional):23777}  Physical Exam   No results found for any visits on 06/24/23.      Assessment & Plan:   There are no diagnoses linked to this encounter.   No follow-ups on file.    Laneta Pintos, MD

## 2023-06-24 ENCOUNTER — Encounter: Payer: Self-pay | Admitting: Family Medicine

## 2023-06-24 ENCOUNTER — Ambulatory Visit (INDEPENDENT_AMBULATORY_CARE_PROVIDER_SITE_OTHER): Payer: Medicare Other | Admitting: Family Medicine

## 2023-06-24 VITALS — BP 124/81 | HR 71 | Ht 68.0 in | Wt 208.0 lb

## 2023-06-24 DIAGNOSIS — R9389 Abnormal findings on diagnostic imaging of other specified body structures: Secondary | ICD-10-CM

## 2023-06-24 DIAGNOSIS — E782 Mixed hyperlipidemia: Secondary | ICD-10-CM

## 2023-06-24 DIAGNOSIS — E039 Hypothyroidism, unspecified: Secondary | ICD-10-CM

## 2023-06-24 DIAGNOSIS — R809 Proteinuria, unspecified: Secondary | ICD-10-CM | POA: Diagnosis not present

## 2023-06-24 DIAGNOSIS — E1159 Type 2 diabetes mellitus with other circulatory complications: Secondary | ICD-10-CM | POA: Diagnosis not present

## 2023-06-24 DIAGNOSIS — I2510 Atherosclerotic heart disease of native coronary artery without angina pectoris: Secondary | ICD-10-CM | POA: Diagnosis not present

## 2023-06-24 DIAGNOSIS — E1169 Type 2 diabetes mellitus with other specified complication: Secondary | ICD-10-CM | POA: Diagnosis not present

## 2023-06-24 DIAGNOSIS — E118 Type 2 diabetes mellitus with unspecified complications: Secondary | ICD-10-CM

## 2023-06-24 DIAGNOSIS — E1129 Type 2 diabetes mellitus with other diabetic kidney complication: Secondary | ICD-10-CM | POA: Diagnosis not present

## 2023-06-24 DIAGNOSIS — B20 Human immunodeficiency virus [HIV] disease: Secondary | ICD-10-CM

## 2023-06-24 DIAGNOSIS — Z7984 Long term (current) use of oral hypoglycemic drugs: Secondary | ICD-10-CM | POA: Diagnosis not present

## 2023-06-24 DIAGNOSIS — I152 Hypertension secondary to endocrine disorders: Secondary | ICD-10-CM

## 2023-06-24 LAB — POCT GLYCOSYLATED HEMOGLOBIN (HGB A1C): HbA1c POC (<> result, manual entry): 6.9 % (ref 4.0–5.6)

## 2023-06-24 NOTE — Assessment & Plan Note (Signed)
 Takes lisinopril  and HCTZ.  Continue this.  Blood pressure at goal today.

## 2023-06-24 NOTE — Assessment & Plan Note (Signed)
 Patient's recent x-ray shows left-sided interstitial changes.  Patient is asymptomatic and declines CT.  Likely reflect scarring from previous infections.  If symptoms change would get high resolution CT of the chest.

## 2023-06-24 NOTE — Assessment & Plan Note (Signed)
 Will check TSH today.  Continue 25 mcg dosing.

## 2023-06-24 NOTE — Assessment & Plan Note (Signed)
 Expect heartbeat.  Followed by Dr. Fontaine Ice.  Most recent viral load and CD4 count was good.

## 2023-06-24 NOTE — Assessment & Plan Note (Signed)
 Recheck UACR today.

## 2023-06-24 NOTE — Patient Instructions (Addendum)
 It was nice to see you today,  We addressed the following topics today: -I am going to get some blood test including cholesterol level.  If your cholesterol level is above the goal we will consider switching to a different cholesterol medication.  Also if you have trouble remembering to take the medication every night, it does not actually matter what time do you take it as long as you take it every day.   Have a great day,  Etha Henle, MD

## 2023-06-24 NOTE — Assessment & Plan Note (Signed)
 Checking lipid panel today.  Last 1 was 74 for LDL.  Goal should be less than 70 given his CT findings assistant with CAD.  Patient misses a few doses a week.

## 2023-06-24 NOTE — Assessment & Plan Note (Signed)
 Coronary plaque seen on CT scan.  Goal should be less than 70 for LDL.  Currently on pravastatin .  Does not take every day due to forgetting.  Advised him he can switch the time of day if that helps him take it more consistently.  Getting LDL today.

## 2023-06-24 NOTE — Assessment & Plan Note (Signed)
 A1c still less than 7.  Getting UACR today.  Foot exam normal today.  Getting kidney function testing/GFR.  No change in medication, counseled on improving his diet/limiting carbohydrates.  Follow-up in 6 months.

## 2023-06-25 LAB — COMPREHENSIVE METABOLIC PANEL WITH GFR
ALT: 6 IU/L (ref 0–44)
AST: 17 IU/L (ref 0–40)
Albumin: 4.6 g/dL (ref 3.9–4.9)
Alkaline Phosphatase: 95 IU/L (ref 44–121)
BUN/Creatinine Ratio: 15 (ref 10–24)
BUN: 16 mg/dL (ref 8–27)
Bilirubin Total: 0.3 mg/dL (ref 0.0–1.2)
CO2: 20 mmol/L (ref 20–29)
Calcium: 9.9 mg/dL (ref 8.6–10.2)
Chloride: 97 mmol/L (ref 96–106)
Creatinine, Ser: 1.07 mg/dL (ref 0.76–1.27)
Globulin, Total: 2.9 g/dL (ref 1.5–4.5)
Glucose: 110 mg/dL — ABNORMAL HIGH (ref 70–99)
Potassium: 4.3 mmol/L (ref 3.5–5.2)
Sodium: 136 mmol/L (ref 134–144)
Total Protein: 7.5 g/dL (ref 6.0–8.5)
eGFR: 77 mL/min/{1.73_m2} (ref >59)

## 2023-06-25 LAB — LIPID PANEL
Chol/HDL Ratio: 3.2 ratio (ref 0.0–5.0)
Cholesterol, Total: 158 mg/dL (ref 100–199)
HDL: 49 mg/dL (ref >39)
LDL Chol Calc (NIH): 81 mg/dL (ref 0–99)
Triglycerides: 161 mg/dL — ABNORMAL HIGH (ref 0–149)
VLDL Cholesterol Cal: 28 mg/dL (ref 5–40)

## 2023-06-25 LAB — CBC
Hematocrit: 42.4 % (ref 37.5–51.0)
Hemoglobin: 14.6 g/dL (ref 13.0–17.7)
MCH: 32.9 pg (ref 26.6–33.0)
MCHC: 34.4 g/dL (ref 31.5–35.7)
MCV: 96 fL (ref 79–97)
Platelets: 317 10*3/uL (ref 150–450)
RBC: 4.44 x10E6/uL (ref 4.14–5.80)
RDW: 12.5 % (ref 11.6–15.4)
WBC: 8.6 10*3/uL (ref 3.4–10.8)

## 2023-06-25 LAB — MICROALBUMIN / CREATININE URINE RATIO
Creatinine, Urine: 65.6 mg/dL
Microalb/Creat Ratio: 13 mg/g{creat} (ref 0–29)
Microalbumin, Urine: 8.8 ug/mL

## 2023-06-25 LAB — TSH: TSH: 2.91 u[IU]/mL (ref 0.450–4.500)

## 2023-09-11 ENCOUNTER — Other Ambulatory Visit: Payer: Self-pay | Admitting: Family Medicine

## 2023-09-11 DIAGNOSIS — E1169 Type 2 diabetes mellitus with other specified complication: Secondary | ICD-10-CM

## 2023-09-11 DIAGNOSIS — E781 Pure hyperglyceridemia: Secondary | ICD-10-CM

## 2023-09-18 ENCOUNTER — Other Ambulatory Visit: Payer: Self-pay | Admitting: Infectious Disease

## 2023-09-18 DIAGNOSIS — B20 Human immunodeficiency virus [HIV] disease: Secondary | ICD-10-CM

## 2023-10-07 ENCOUNTER — Encounter

## 2023-10-14 ENCOUNTER — Other Ambulatory Visit: Payer: Self-pay | Admitting: Infectious Disease

## 2023-10-14 DIAGNOSIS — B20 Human immunodeficiency virus [HIV] disease: Secondary | ICD-10-CM

## 2023-10-24 ENCOUNTER — Other Ambulatory Visit: Payer: Self-pay | Admitting: Family Medicine

## 2023-10-24 DIAGNOSIS — E1159 Type 2 diabetes mellitus with other circulatory complications: Secondary | ICD-10-CM

## 2023-11-05 ENCOUNTER — Other Ambulatory Visit: Payer: Self-pay | Admitting: Infectious Disease

## 2023-11-05 DIAGNOSIS — B20 Human immunodeficiency virus [HIV] disease: Secondary | ICD-10-CM

## 2023-11-05 NOTE — Progress Notes (Unsigned)
 Subjective:  Chief complaint: follow-up for HIV disease on medications   Patient ID: Jonathan Estes, male    DOB: 05-01-1955, 68 y.o.   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  Under fluoroscopy, transbronchial /notes 07/04/2010   CONDYLOMA EXCISION/FULGURATION  ~ 1996   anal/notes 07/04/2010    Family History  Problem Relation Age of Onset   Colon cancer Father    Leukemia Maternal Grandfather       Social History   Socioeconomic History   Marital status: Single    Spouse name: Not on file   Number of children: Not on file   Years of education: Not on file   Highest education level: Not on file  Occupational History   Occupation: Retired  Tobacco Use   Smoking status: Never   Smokeless tobacco: Never  Vaping Use   Vaping status: Never Used  Substance and Sexual Activity   Alcohol use: Yes    Comment: 03/21/2017 none since 01/12/2017   Drug use: Yes    Types: Marijuana, Cocaine    Comment: 03/21/2017 no cocaine for a few years; I smoke marijuana qd   Sexual activity: Never    Partners: Female    Comment: declined condoms  Other Topics Concern   Not on file  Social History Narrative   Lives  alone.         Social Drivers of Corporate investment banker Strain: Low Risk  (10/30/2022)   Overall Financial Resource Strain (CARDIA)    Difficulty of Paying Living Expenses: Not hard at all  Food Insecurity: No Food Insecurity (10/30/2022)   Hunger Vital Sign    Worried About Running Out of Food in the Last Year: Never true    Ran Out of Food in the Last Year: Never true  Transportation Needs: No Transportation Needs (10/30/2022)   PRAPARE - Administrator, Civil Service (Medical): No    Lack of Transportation (Non-Medical): No  Physical Activity: Inactive (10/30/2022)   Exercise Vital Sign    Days of Exercise per Week: 0 days    Minutes of Exercise per Session: 0 min  Stress: No Stress Concern Present (10/30/2022)   [employer] of Occupational Health - Occupational Stress Questionnaire    Feeling of Stress : Not at all  Social Connections: Socially Isolated (10/30/2022)   Social Connection and Isolation Panel    Frequency of Communication with Friends and Family: More than three times a week    Frequency of Social Gatherings with Friends and Family: More than three times a week    Attends Religious Services: Never    Database administrator or Organizations: No  Attends Banker Meetings: Never    Marital Status: Never married    Allergies  Allergen Reactions   Sulfamethoxazole-Trimethoprim     REACTION: severe rash     Current Outpatient Medications:    BIKTARVY  50-200-25 MG TABS tablet, TAKE 1 TABLET BY MOUTH 1 TIME A DAY, Disp: 30 tablet, Rfl: 0   cetirizine  (ZYRTEC ) 10 MG tablet, TAKE 1 TABLET (10 MG TOTAL) BY MOUTH DAILY., Disp: 90 tablet, Rfl: 2   [employer] LANCETS ORIGINAL MISC, 1 application by Does not apply route daily., Disp: 90 each, Rfl: 4   gemfibrozil  (LOPID ) 600 MG tablet, TAKE 1 TABLET BY MOUTH TWICE A DAY, Disp: 180 tablet, Rfl: 1   glucose blood (ACCU-CHEK AVIVA) test strip, Use as instructed, Disp: 100 each, Rfl: 12    hydrochlorothiazide  (MICROZIDE ) 12.5 MG capsule, TAKE 1 CAPSULE BY MOUTH EVERY DAY, Disp: 90 capsule, Rfl: 1   levothyroxine  (SYNTHROID ) 25 MCG tablet, TAKE 1 TABLET BY MOUTH EVERY DAY BEFORE BREAKFAST, Disp: 90 tablet, Rfl: 1   lisinopril  (ZESTRIL ) 40 MG tablet, TAKE 1 TABLET BY MOUTH EVERY DAY, Disp: 90 tablet, Rfl: 1   metFORMIN  (GLUCOPHAGE ) 500 MG tablet, TAKE 0.5 TABLET (250 MG TOTAL) BY MOUTH 2 (TWO) TIMES DAILY WITH A MEAL., Disp: 90 tablet, Rfl: 1   Misc Natural Products (OSTEO BI-FLEX JOINT SHIELD PO), Take 1 tablet by mouth daily., Disp: , Rfl:    Multiple Vitamins-Minerals (ICAPS AREDS 2 PO), Take by mouth., Disp: , Rfl:    pravastatin  (PRAVACHOL ) 40 MG tablet, TAKE 1 TABLET BY MOUTH EVERY DAY, Disp: 90 tablet, Rfl: 1   zinc gluconate 50 MG tablet, Take 50 mg by mouth daily., Disp: , Rfl:    Review of Systems     Objective:   Physical Exam        Assessment & Plan:

## 2023-11-06 ENCOUNTER — Encounter: Payer: Self-pay | Admitting: Infectious Disease

## 2023-11-06 ENCOUNTER — Ambulatory Visit: Admitting: Infectious Disease

## 2023-11-06 ENCOUNTER — Other Ambulatory Visit: Payer: Self-pay

## 2023-11-06 ENCOUNTER — Other Ambulatory Visit (HOSPITAL_COMMUNITY)
Admission: RE | Admit: 2023-11-06 | Discharge: 2023-11-06 | Disposition: A | Discharge status: Home / Self Care | Source: Ambulatory Visit | Attending: Infectious Disease | Admitting: Infectious Disease

## 2023-11-06 VITALS — BP 129/77 | HR 66 | Temp 97.7°F | Wt 196.0 lb

## 2023-11-06 DIAGNOSIS — E1169 Type 2 diabetes mellitus with other specified complication: Secondary | ICD-10-CM

## 2023-11-06 DIAGNOSIS — I2510 Atherosclerotic heart disease of native coronary artery without angina pectoris: Secondary | ICD-10-CM | POA: Insufficient documentation

## 2023-11-06 DIAGNOSIS — I152 Hypertension secondary to endocrine disorders: Secondary | ICD-10-CM | POA: Diagnosis not present

## 2023-11-06 DIAGNOSIS — E1159 Type 2 diabetes mellitus with other circulatory complications: Secondary | ICD-10-CM

## 2023-11-06 DIAGNOSIS — E038 Other specified hypothyroidism: Secondary | ICD-10-CM | POA: Insufficient documentation

## 2023-11-06 DIAGNOSIS — Z7185 Encounter for immunization safety counseling: Secondary | ICD-10-CM | POA: Diagnosis not present

## 2023-11-06 DIAGNOSIS — E782 Mixed hyperlipidemia: Secondary | ICD-10-CM | POA: Insufficient documentation

## 2023-11-06 DIAGNOSIS — B20 Human immunodeficiency virus [HIV] disease: Secondary | ICD-10-CM

## 2023-11-06 DIAGNOSIS — Z23 Encounter for immunization: Secondary | ICD-10-CM

## 2023-11-06 DIAGNOSIS — E785 Hyperlipidemia, unspecified: Secondary | ICD-10-CM | POA: Diagnosis not present

## 2023-11-07 ENCOUNTER — Ambulatory Visit: Payer: Medicare Other

## 2023-11-07 DIAGNOSIS — Z0000 Encounter for general adult medical examination without abnormal findings: Secondary | ICD-10-CM

## 2023-11-07 LAB — URINE CYTOLOGY ANCILLARY ONLY
Chlamydia: NEGATIVE
Comment: NEGATIVE
Comment: NORMAL
Neisseria Gonorrhea: NEGATIVE

## 2023-11-07 NOTE — Patient Instructions (Signed)
 Jonathan Estes , Thank you for taking time out of your busy schedule to complete your Annual Wellness Visit with me. I enjoyed our conversation and look forward to speaking with you again next year. I, as well as your care team,  appreciate your ongoing commitment to your health goals. Please review the following plan we discussed and let me know if I can assist you in the future. Your Game plan/ To Do List    Referrals: If you haven't heard from the office you've been referred to, please reach out to them at the phone provided.   Follow up Visits: We will see or speak with you next year for your Next Medicare AWV with our clinical staff Have you seen your provider in the last 6 months (3 months if uncontrolled diabetes)? Yes  Clinician Recommendations:  Aim for 30 minutes of exercise or brisk walking, 6-8 glasses of water, and 5 servings of fruits and vegetables each day.       This is a list of the screenings recommended for you:  Health Maintenance  Topic Date Due   Eye exam for diabetics  11/29/2017   COVID-19 Vaccine (5 - 2025-26 season) 11/04/2023   Pneumococcal Vaccine for age over 42 (4 of 4 - PCV20 or PCV21) 06/23/2024*   Hemoglobin A1C  12/24/2023   Yearly kidney health urinalysis for diabetes  06/23/2024   Complete foot exam   06/23/2024   Yearly kidney function blood test for diabetes  11/05/2024   Medicare Annual Wellness Visit  11/06/2024   Cologuard (Stool DNA test)  12/18/2025   DTaP/Tdap/Td vaccine (4 - Td or Tdap) 09/24/2030   Flu Shot  Completed   Hepatitis B Vaccine  Completed   Hepatitis C Screening  Completed   HIV Screening  Completed   Zoster (Shingles) Vaccine  Completed   HPV Vaccine  Aged Out   Meningitis B Vaccine  Aged Out   Colon Cancer Screening  Discontinued  *Topic was postponed. The date shown is not the original due date.    Advanced directives: (ACP Link)Information on Advanced Care Planning can be found at [place]  Secretary of [facility] Advance  Health Care Directives Advance Health Care Directives. [email or web address]  Advance Care Planning is important because it:  [x]  Makes sure you receive the medical care that is consistent with your values, goals, and preferences  [x]  It provides guidance to your family and loved ones and reduces their decisional burden about whether or not they are making the right decisions based on your wishes.  Follow the link provided in your after visit summary or read over the paperwork we have mailed to you to help you started getting your Advance Directives in place. If you need assistance in completing these, please reach out to us  so that we can help you!  See attachments for Preventive Care and Fall Prevention Tips.

## 2023-11-07 NOTE — Progress Notes (Addendum)
 Subjective:   Jonathan Estes is a 68 y.o. who presents for a Medicare Wellness preventive visit.  As a reminder, Annual Wellness Visits don't include a physical exam, and some assessments may be limited, especially if this visit is performed virtually. We may recommend an in-person follow-up visit with your provider if needed.  Visit Complete: Virtual I connected with  Jonathan Estes on 11/07/23 by a audio enabled telemedicine application and verified that I am speaking with the correct person using two identifiers.  Patient Location: Home  Provider Location: Home Office  I discussed the limitations of evaluation and management by telemedicine. The patient expressed understanding and agreed to proceed.  Vital Signs: Because this visit was a virtual/telehealth visit, some criteria may be missing or patient reported. Any vitals not documented were not able to be obtained and vitals that have been documented are patient reported.  VideoError- Librarian, academic were attempted between this provider and patient, however failed, due to patient having technical difficulties OR patient did not have access to video capability.  We continued and completed visit with audio only.   Persons Participating in Visit: Patient.  AWV Questionnaire: No: Patient Medicare AWV questionnaire was not completed prior to this visit.  Cardiac Risk Factors include: advanced age (>48men, >72 women);diabetes mellitus;dyslipidemia;hypertension;male gender     Objective:    Today's Vitals   There is no height or weight on file to calculate BMI.     11/07/2023    1:02 PM 10/30/2022    2:35 PM 01/21/2018   11:10 AM 03/21/2017    5:03 PM 11/26/2016    8:19 AM 07/23/2016    2:47 PM  Advanced Directives  Does Patient Have a Medical Advance Directive? No No No  No  No  No   Would patient like information on creating a medical advance directive? No - Patient declined No - Patient declined  No - Patient declined  No - Patient declined  No - Patient declined  No - Patient declined      Data saved with a previous flowsheet row definition    Current Medications (verified) Outpatient Encounter Medications as of 11/07/2023  Medication Sig   BIKTARVY  50-200-25 MG TABS tablet TAKE 1 TABLET BY MOUTH 1 TIME A DAY   cetirizine  (ZYRTEC ) 10 MG tablet TAKE 1 TABLET (10 MG TOTAL) BY MOUTH DAILY.   gemfibrozil  (LOPID ) 600 MG tablet TAKE 1 TABLET BY MOUTH TWICE A DAY   hydrochlorothiazide  (MICROZIDE ) 12.5 MG capsule TAKE 1 CAPSULE BY MOUTH EVERY DAY   levothyroxine  (SYNTHROID ) 25 MCG tablet TAKE 1 TABLET BY MOUTH EVERY DAY BEFORE BREAKFAST   lisinopril  (ZESTRIL ) 40 MG tablet TAKE 1 TABLET BY MOUTH EVERY DAY   metFORMIN  (GLUCOPHAGE ) 500 MG tablet TAKE 0.5 TABLET (250 MG TOTAL) BY MOUTH 2 (TWO) TIMES DAILY WITH A MEAL.   Misc Natural Products (OSTEO BI-FLEX JOINT SHIELD PO) Take 1 tablet by mouth daily.   Multiple Vitamins-Minerals (ICAPS AREDS 2 PO) Take by mouth.   pravastatin  (PRAVACHOL ) 40 MG tablet TAKE 1 TABLET BY MOUTH EVERY DAY   zinc gluconate 50 MG tablet Take 50 mg by mouth daily.   [employer]S LANCETS ORIGINAL MISC 1 application by Does not apply route daily. (Patient not taking: Reported on 11/07/2023)   glucose blood (ACCU-CHEK AVIVA) test strip Use as instructed (Patient not taking: Reported on 11/07/2023)   No facility-administered encounter medications on file as of 11/07/2023.    Allergies (verified) Sulfamethoxazole-trimethoprim  History: Past Medical History:  Diagnosis Date   AIDS (HCC) 07/12/2014   AKI (acute kidney injury) (HCC) 08/12/2017   Anxiety    Arthritis    hips, shoulders (03/21/2017)   Bilateral bunions    left foot worse   Coronary artery disease involving native coronary artery 01/12/2015   Diabetes mellitus type 2, controlled, without complications (HCC) 01/12/2015   Diabetes type 2, controlled (HCC) 07/12/2014   High triglycerides    HTN (hypertension)     Human immunodeficiency virus (HIV) disease (HCC) d'x 09/2007   Hyperlipidemia    Hyperlipidemia 10/11/2022   Hypothyroid    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  Under fluoroscopy, transbronchial /notes 07/04/2010   CONDYLOMA EXCISION/FULGURATION  ~ 1996   anal/notes 07/04/2010   Family History  Problem Relation Age of Onset   Colon cancer Father    Leukemia Maternal Grandfather    Social History   Socioeconomic History   Marital status: Single    Spouse name: Not on file   Number of children: Not on file   Years of education: Not on file   Highest education level: Not on file  Occupational History   Occupation: Retired  Tobacco Use   Smoking status: Never   Smokeless tobacco: Never  Vaping Use   Vaping status: Never Used  Substance and Sexual Activity   Alcohol use: Not Currently    Comment: 03/21/2017 none since 01/12/2017   Drug use: Yes    Types: Marijuana    Comment: 03/21/2017 no cocaine for a few years; I smoke marijuana qd   Sexual activity: Never    Partners: Female    Comment: declined condoms  Other Topics Concern   Not on file  Social History Narrative   Lives alone.         Social Drivers of Corporate investment banker Strain: Low Risk  (11/07/2023)   Overall Financial Resource Strain (CARDIA)    Difficulty of Paying Living Expenses: Not hard at all  Food Insecurity: No Food Insecurity (11/07/2023)   Hunger Vital Sign    Worried About Running Out of Food in the Last Year: Never true    Ran Out of Food in the Last Year: Never true  Transportation Needs: No Transportation Needs (11/07/2023)   PRAPARE - Administrator, Civil Service (Medical): No    Lack of Transportation (Non-Medical): No  Physical Activity: Inactive (11/07/2023)   Exercise Vital Sign    Days of  Exercise per Week: 0 days    Minutes of Exercise per Session: 0 min  Stress: No Stress Concern Present (11/07/2023)   [employer] of Occupational Health - Occupational Stress Questionnaire    Feeling of Stress: Not at all  Social Connections: Socially Isolated (11/07/2023)   Social Connection and Isolation Panel    Frequency of Communication with Friends and Family: Once a week    Frequency of Social Gatherings with Friends and Family: Never    Attends Religious Services: Never    Database administrator or Organizations: No    Attends Engineer, structural: Never    Marital Status: Never married    Tobacco Counseling Counseling given: Not Answered    Clinical Intake:  Pre-visit preparation completed: Yes  Pain : No/denies pain     Nutritional Risks:  None Diabetes: Yes CBG done?: No Did pt. bring in CBG monitor from home?: No  Lab Results  Component Value Date   HGBA1C 6.9 06/24/2023   HGBA1C 6.5 12/24/2022   HGBA1C 6.6 06/18/2022     How often do you need to have someone help you when you read instructions, pamphlets, or other written materials from your doctor or pharmacy?: 1 - Never  Interpreter Needed?: No  Information entered by :: NAllen LPN   Activities of Daily Living     11/07/2023   12:57 PM  In your present state of health, do you have any difficulty performing the following activities:  Hearing? 0  Vision? 0  Difficulty concentrating or making decisions? 0  Walking or climbing stairs? 0  Dressing or bathing? 0  Doing errands, shopping? 0  Preparing Food and eating ? N  Using the Toilet? N  In the past six months, have you accidently leaked urine? N  Do you have problems with loss of bowel control? N  Managing your Medications? N  Managing your Finances? N  Housekeeping or managing your Housekeeping? N    Patient Care Team: Chandra Toribio POUR, MD as PCP - General (Family Medicine) Debrah Lamar BIRCH, MD (Inactive) as Consulting  Physician (Gastroenterology) Fleeta Rothman, Jomarie SAILOR, MD as Consulting Physician (Infectious Diseases) Darold Lamar, OD as Referring Physician (Optometry) [facility]  I have updated your Care Teams any recent Medical Services you may have received from other providers in the past year.     Assessment:   This is a routine wellness examination for Jonathan Estes.  Hearing/Vision screen Hearing Screening - Comments:: Denies hearing issues Vision Screening - Comments:: No regular eye exams   Goals Addressed             This Visit's Progress    Patient Stated       11/07/2023, drink more water       Depression Screen     11/07/2023    1:03 PM 06/24/2023    9:35 AM 12/24/2022    9:02 AM 10/30/2022    2:34 PM 10/11/2022    9:00 AM 06/18/2022    9:09 AM 10/16/2021    9:40 AM  PHQ 2/9 Scores  PHQ - 2 Score 0 0 0 0 0 0 0  PHQ- 9 Score 0 0 0        Fall Risk     11/07/2023    1:03 PM 11/06/2023    9:20 AM 10/30/2022    2:35 PM 10/11/2022    9:00 AM 10/16/2021    9:40 AM  Fall Risk   Falls in the past year? 0 0 0 0 0  Number falls in past yr: 0 0 0 0 0  Injury with Fall? 0 0 0 0 0  Risk for fall due to : Medication side effect No Fall Risks No Fall Risks  No Fall Risks  Follow up Falls evaluation completed;Falls prevention discussed Falls evaluation completed Falls prevention discussed  Falls evaluation completed      Data saved with a previous flowsheet row definition    MEDICARE RISK AT HOME:  Medicare Risk at Home Any stairs in or around the home?: No If so, are there any without handrails?: No Home free of loose throw rugs in walkways, pet beds, electrical cords, etc?: Yes Adequate lighting in your home to reduce risk of falls?: Yes Life alert?: No Use of a cane, walker or w/c?: No Grab bars in  the bathroom?: No Shower chair or bench in shower?: No Elevated toilet seat or a handicapped toilet?: No  TIMED UP AND GO:  Was the test performed?  No  Cognitive  Function: 6CIT completed        11/07/2023    1:04 PM 10/30/2022    2:36 PM 09/01/2021    9:20 AM  6CIT Screen  What Year? 0 points 0 points 0 points  What month? 0 points 0 points 0 points  What time? 0 points 0 points 0 points  Count back from 20 0 points 0 points 0 points  Months in reverse 0 points 0 points 0 points  Repeat phrase 0 points 0 points 0 points  Total Score 0 points 0 points 0 points    Immunizations Immunization History  Administered Date(s) Administered   Hepatitis A 04/12/2008, 09/13/2008   Hepatitis B 04/12/2008, 09/13/2008, 02/07/2009, 12/17/2011   Hepatitis B, ADULT 12/01/2015, 01/04/2016, 05/30/2016   INFLUENZA, HIGH DOSE SEASONAL PF 11/06/2023   Influenza Split 11/27/2010, 12/17/2011   Influenza Whole 12/29/2007, 01/24/2009, 04/17/2010   Influenza,inj,Quad PF,6+ Mos 11/10/2012, 11/04/2013, 12/01/2015, 11/29/2016, 12/16/2017, 10/29/2018   Influenza,inj,Quad PF,6-35 Mos 12/16/2014   Influenza,trivalent, recombinat, inj, PF 11/21/2022   Influenza-Unspecified 11/27/2019   PFIZER(Purple Top)SARS-COV-2 Vaccination 05/24/2019, 06/14/2019, 10/23/2019, 04/13/2020   Pneumococcal Conjugate-13 08/12/2017   Pneumococcal Polysaccharide-23 10/07/2007, 01/30/2010, 04/04/2017   Td 02/07/2009   Tdap 01/29/2010, 09/23/2020   Zoster Recombinant(Shingrix ) 10/29/2018, 01/27/2019    Screening Tests Health Maintenance  Topic Date Due   OPHTHALMOLOGY EXAM  11/29/2017   COVID-19 Vaccine (5 - 2025-26 season) 11/04/2023   Pneumococcal Vaccine: 50+ Years (4 of 4 - PCV20 or PCV21) 06/23/2024 (Originally 08/13/2022)   HEMOGLOBIN A1C  12/24/2023   Diabetic kidney evaluation - Urine ACR  06/23/2024   FOOT EXAM  06/23/2024   Diabetic kidney evaluation - eGFR measurement  11/05/2024   Medicare Annual Wellness (AWV)  11/06/2024   Fecal DNA (Cologuard)  12/18/2025   DTaP/Tdap/Td (4 - Td or Tdap) 09/24/2030   INFLUENZA VACCINE  Completed   Hepatitis B Vaccines 19-59 Average Risk   Completed   Hepatitis C Screening  Completed   HIV Screening  Completed   Zoster Vaccines- Shingrix   Completed   HPV VACCINES  Aged Out   Meningococcal B Vaccine  Aged Out   Colonoscopy  Discontinued    Health Maintenance  Health Maintenance Due  Topic Date Due   OPHTHALMOLOGY EXAM  11/29/2017   COVID-19 Vaccine (5 - 2025-26 season) 11/04/2023   Health Maintenance Items Addressed: Declines covid vaccine. Declines eye exams.  Additional Screening:  Vision Screening: Recommended annual ophthalmology exams for early detection of glaucoma and other disorders of the eye. Would you like a referral to an eye doctor? No    Dental Screening: Recommended annual dental exams for proper oral hygiene  Community Resource Referral / Chronic Care Management: CRR required this visit?  No   CCM required this visit?  No   Plan:    I have personally reviewed and noted the following in the patient's chart:   Medical and social history Use of alcohol, tobacco or illicit drugs  Current medications and supplements including opioid prescriptions. Patient is not currently taking opioid prescriptions. Functional ability and status Nutritional status Physical activity Advanced directives List of other physicians Hospitalizations, surgeries, and ER visits in previous 12 months Vitals Screenings to include cognitive, depression, and falls Referrals and appointments  In addition, I have reviewed and discussed with patient certain preventive  protocols, quality metrics, and best practice recommendations. A written personalized care plan for preventive services as well as general preventive health recommendations were provided to patient.   Ardella FORBES Dawn, LPN   [id number]   After Visit Summary: (Pick Up) Due to this being a telephonic visit, with patients personalized plan was offered to patient and patient has requested to Pick up at office.  Notes: Nothing significant to report at this  time.

## 2023-11-08 LAB — T-HELPER CELLS (CD4) COUNT (NOT AT ARMC)
CD4 % Helper T Cell: 32 % — ABNORMAL LOW (ref 33–65)
CD4 T Cell Abs: 681 /uL (ref 400–1790)

## 2023-11-09 LAB — CBC WITH DIFFERENTIAL/PLATELET
Absolute Lymphocytes: 2329 {cells}/uL (ref 850–3900)
Absolute Monocytes: 568 {cells}/uL (ref 200–950)
Basophils Absolute: 99 {cells}/uL (ref 0–200)
Basophils Relative: 1.4 %
Eosinophils Absolute: 256 {cells}/uL (ref 15–500)
Eosinophils Relative: 3.6 %
HCT: 42.3 % (ref 38.5–50.0)
Hemoglobin: 14.4 g/dL (ref 13.2–17.1)
MCH: 32.6 pg (ref 27.0–33.0)
MCHC: 34 g/dL (ref 32.0–36.0)
MCV: 95.7 fL (ref 80.0–100.0)
MPV: 9.9 fL (ref 7.5–12.5)
Monocytes Relative: 8 %
Neutro Abs: 3848 {cells}/uL (ref 1500–7800)
Neutrophils Relative %: 54.2 %
Platelets: 287 Thousand/uL (ref 140–400)
RBC: 4.42 Million/uL (ref 4.20–5.80)
RDW: 12.8 % (ref 11.0–15.0)
Total Lymphocyte: 32.8 %
WBC: 7.1 Thousand/uL (ref 3.8–10.8)

## 2023-11-09 LAB — COMPLETE METABOLIC PANEL WITHOUT GFR
AG Ratio: 1.7 (calc) (ref 1.0–2.5)
ALT: 7 U/L — ABNORMAL LOW (ref 9–46)
AST: 11 U/L (ref 10–35)
Albumin: 4.6 g/dL (ref 3.6–5.1)
Alkaline phosphatase (APISO): 85 U/L (ref 35–144)
BUN: 15 mg/dL (ref 7–25)
CO2: 27 mmol/L (ref 20–32)
Calcium: 9.6 mg/dL (ref 8.6–10.3)
Chloride: 101 mmol/L (ref 98–110)
Creat: 1.07 mg/dL (ref 0.70–1.35)
Globulin: 2.7 g/dL (ref 1.9–3.7)
Glucose, Bld: 101 mg/dL — ABNORMAL HIGH (ref 65–99)
Potassium: 4.3 mmol/L (ref 3.5–5.3)
Sodium: 138 mmol/L (ref 135–146)
Total Bilirubin: 0.4 mg/dL (ref 0.2–1.2)
Total Protein: 7.3 g/dL (ref 6.1–8.1)

## 2023-11-09 LAB — LIPID PANEL
Cholesterol: 135 mg/dL (ref <200)
HDL: 52 mg/dL (ref >=40)
LDL Cholesterol (Calc): 60 mg/dL
Non-HDL Cholesterol (Calc): 83 mg/dL (ref <130)
Total CHOL/HDL Ratio: 2.6 (calc) (ref <5.0)
Triglycerides: 148 mg/dL (ref <150)

## 2023-11-09 LAB — HIV-1 RNA QUANT-NO REFLEX-BLD
HIV 1 RNA Quant: <20 {copies}/mL — AB
HIV-1 RNA Quant, Log: <1.3 {Log_copies}/mL — AB

## 2023-11-09 LAB — RPR TITER: RPR Titer: 1:2 {titer} — ABNORMAL HIGH

## 2023-11-09 LAB — T PALLIDUM AB: T Pallidum Abs: POSITIVE — AB

## 2023-11-09 LAB — RPR: RPR Ser Ql: REACTIVE — AB

## 2023-12-04 NOTE — Progress Notes (Signed)
 Jonathan Estes                                          MRN: [id number]   12/04/2023   The [facility] Quality Team Specialist reviewed this patient medical record for the purposes of chart review for care gap closure. The following were reviewed: chart review for care gap closure-diabetic eye exam.    [facility] Quality Team

## 2023-12-13 ENCOUNTER — Other Ambulatory Visit: Payer: Self-pay | Admitting: *Deleted

## 2023-12-13 ENCOUNTER — Other Ambulatory Visit: Payer: Self-pay | Admitting: Family Medicine

## 2023-12-13 DIAGNOSIS — E119 Type 2 diabetes mellitus without complications: Secondary | ICD-10-CM

## 2023-12-13 DIAGNOSIS — E038 Other specified hypothyroidism: Secondary | ICD-10-CM

## 2023-12-17 ENCOUNTER — Other Ambulatory Visit

## 2023-12-17 DIAGNOSIS — E038 Other specified hypothyroidism: Secondary | ICD-10-CM

## 2023-12-17 DIAGNOSIS — E119 Type 2 diabetes mellitus without complications: Secondary | ICD-10-CM

## 2023-12-19 ENCOUNTER — Other Ambulatory Visit: Payer: Self-pay | Admitting: Family Medicine

## 2023-12-19 DIAGNOSIS — E038 Other specified hypothyroidism: Secondary | ICD-10-CM

## 2023-12-23 NOTE — Progress Notes (Signed)
 Jonathan Estes                                          MRN: [id number]   12/23/2023   The [facility] Quality Team Specialist reviewed this patient medical record for the purposes of chart review for care gap closure. The following were reviewed: chart review for care gap closure-diabetic eye exam.    [facility] Quality Team

## 2023-12-24 ENCOUNTER — Ambulatory Visit: Admitting: Family Medicine

## 2023-12-24 ENCOUNTER — Encounter: Payer: Self-pay | Admitting: Family Medicine

## 2023-12-24 VITALS — BP 129/79 | HR 66 | Ht 68.0 in | Wt 198.0 lb

## 2023-12-24 DIAGNOSIS — Z7984 Long term (current) use of oral hypoglycemic drugs: Secondary | ICD-10-CM

## 2023-12-24 DIAGNOSIS — E782 Mixed hyperlipidemia: Secondary | ICD-10-CM | POA: Diagnosis not present

## 2023-12-24 DIAGNOSIS — E1159 Type 2 diabetes mellitus with other circulatory complications: Secondary | ICD-10-CM

## 2023-12-24 DIAGNOSIS — I152 Hypertension secondary to endocrine disorders: Secondary | ICD-10-CM

## 2023-12-24 DIAGNOSIS — E038 Other specified hypothyroidism: Secondary | ICD-10-CM | POA: Diagnosis not present

## 2023-12-24 DIAGNOSIS — E1169 Type 2 diabetes mellitus with other specified complication: Secondary | ICD-10-CM

## 2023-12-24 DIAGNOSIS — E119 Type 2 diabetes mellitus without complications: Secondary | ICD-10-CM

## 2023-12-24 LAB — POCT GLYCOSYLATED HEMOGLOBIN (HGB A1C): HbA1c POC (<> result, manual entry): 6.5 % (ref 4.0–5.6)

## 2023-12-24 NOTE — Patient Instructions (Addendum)
 It was nice to see you today,  We addressed the following topics today: - I would like you to schedule a diabetic eye exam. You can self-refer to one of the clinics we discussed. Please make sure to ask if they perform diabetic retinopathy screenings when you call. - your A1c was unchanged from last time: 6.5%.  no changes to your medications today.     Have a great day,  Rolan Slain, MD

## 2023-12-24 NOTE — Progress Notes (Signed)
   Established Patient Office Visit  Subjective   Patient ID: Jonathan Estes, male    DOB: Feb 26, 1956  Age: 68 y.o. MRN: [id number]  Chief Complaint  Patient presents with   Medical Management of Chronic Issues    HPI  Subjective - Follow-up for chronic conditions. Reports no new concerns or issues. - States no changes in the last 10-12 years. - Notes a recent attempt at a blood draw by another clinician was unsuccessful. Has a history of difficult venous access. - States they have not had a diabetic eye exam this year. Previous eye doctor moved and is now too far to travel. Discussed options for a new eye doctor, including locations in [place], [place], and at [place]. - Reports occasional intake of dessert but has reduced candy bar consumption.  Medications Reports no new medications.  PMH, PSH, FH, Social Hx PMHx: Diabetes, Hypertension. Social Hx: Reports picking pecans.  ROS Eyes: Has not had a diabetic eye exam this year. No other visual complaints.   The 10-year ASCVD risk score (Arnett DK, et al., 2019) is: 20.5%  Health Maintenance Due  Topic Date Due   OPHTHALMOLOGY EXAM  03/12/2020   COVID-19 Vaccine (5 - 2025-26 season) 11/04/2023      Objective:     BP 129/79   Pulse 66   Ht 5' 8 (1.727 m)   Wt 198 lb (89.8 kg)   SpO2 99%   BMI 30.11 kg/m    Physical Exam Gen: alert, oriented Pulm: no respiratory distress Psych: pleasant affect   Results for orders placed or performed in visit on 12/24/23  POCT glycosylated hemoglobin (Hb A1C)  Result Value Ref Range   Hemoglobin A1C     HbA1c POC (<> result, manual entry) 6.5 4.0 - 5.6 %   HbA1c, POC (prediabetic range)     HbA1c, POC (controlled diabetic range)          Assessment & Plan:   Diabetes mellitus type II, controlled (HCC) Assessment & Plan: - Chronic, stable on current regimen. Last A1C was  controlled. Pt reports dietary modifications, including reducing candy bars. Has not  had a diabetic eye exam this year due to logistical issues with finding a local provider. Office BP is good, and checks occasionally at home. - A1C via finger stick performed in office today: 6.5%, unchanged from previous.  - Continue current management. - Encouraged to schedule a diabetic retinopathy screening.  Orders: -     POCT glycosylated hemoglobin (Hb A1C)  Controlled type 2 diabetes mellitus without complication, without long-term current use of insulin (HCC) -     POCT glycosylated hemoglobin (Hb A1C)  Hypertension associated with diabetes (HCC) Assessment & Plan: - Well-controlled. In-office BP was good. Reports checking BP at home occasionally with good readings. - Continue current management.  Orders: -     POCT glycosylated hemoglobin (Hb A1C)  Mixed diabetic hyperlipidemia associated with type 2 diabetes mellitus (HCC) -     POCT glycosylated hemoglobin (Hb A1C)     Return in about 6 months (around 06/23/2024) for physical.    Toribio MARLA Slain, MD

## 2023-12-24 NOTE — Assessment & Plan Note (Addendum)
-   Chronic, stable on current regimen. Last A1C was  controlled. Pt reports dietary modifications, including reducing candy bars. Has not had a diabetic eye exam this year due to logistical issues with finding a local provider. Office BP is good, and checks occasionally at home. - A1C via finger stick performed in office today: 6.5%, unchanged from previous.  - Continue current management. - Encouraged to schedule a diabetic retinopathy screening.

## 2023-12-24 NOTE — Assessment & Plan Note (Signed)
-   Well-controlled. In-office BP was good. Reports checking BP at home occasionally with good readings. - Continue current management.

## 2023-12-24 NOTE — Assessment & Plan Note (Deleted)
-   Chronic, stable on current regimen. Last A1C was not bad. Pt reports dietary modifications, including reducing candy bars. Has not had a diabetic eye exam this year due to logistical issues with finding a local provider. Office BP is good, and checks occasionally at home. - A1C via finger stick performed in office today. Will discuss results when available. - Continue current management. - Encouraged to schedule a diabetic retinopathy screening.

## 2024-01-23 ENCOUNTER — Other Ambulatory Visit: Payer: Self-pay | Admitting: Family Medicine

## 2024-01-23 DIAGNOSIS — E1169 Type 2 diabetes mellitus with other specified complication: Secondary | ICD-10-CM

## 2024-03-04 ENCOUNTER — Other Ambulatory Visit: Payer: Self-pay | Admitting: Family Medicine

## 2024-03-04 DIAGNOSIS — E1159 Type 2 diabetes mellitus with other circulatory complications: Secondary | ICD-10-CM

## 2024-03-08 ENCOUNTER — Other Ambulatory Visit: Payer: Self-pay | Admitting: Family Medicine

## 2024-03-08 DIAGNOSIS — E781 Pure hyperglyceridemia: Secondary | ICD-10-CM

## 2024-03-08 DIAGNOSIS — E1169 Type 2 diabetes mellitus with other specified complication: Secondary | ICD-10-CM

## 2024-06-15 ENCOUNTER — Encounter

## 2024-06-16 ENCOUNTER — Other Ambulatory Visit

## 2024-06-23 ENCOUNTER — Encounter: Admitting: Family Medicine

## 2024-09-08 ENCOUNTER — Ambulatory Visit: Admitting: Infectious Disease
